# Patient Record
Sex: Female | Born: 1960 | Race: White | Hispanic: No | Smoking: Current some day smoker
Health system: Southern US, Community
[De-identification: ages and names within clinical notes are randomized; demographics above are authoritative.]

## PROBLEM LIST (undated history)

## (undated) DIAGNOSIS — I10 Essential (primary) hypertension: Secondary | ICD-10-CM

## (undated) DIAGNOSIS — M1990 Unspecified osteoarthritis, unspecified site: Secondary | ICD-10-CM

## (undated) DIAGNOSIS — D649 Anemia, unspecified: Secondary | ICD-10-CM

## (undated) DIAGNOSIS — M160 Bilateral primary osteoarthritis of hip: Secondary | ICD-10-CM

## (undated) DIAGNOSIS — J189 Pneumonia, unspecified organism: Secondary | ICD-10-CM

## (undated) DIAGNOSIS — R600 Localized edema: Secondary | ICD-10-CM

## (undated) DIAGNOSIS — A0472 Enterocolitis due to Clostridium difficile, not specified as recurrent: Secondary | ICD-10-CM

## (undated) DIAGNOSIS — J45909 Unspecified asthma, uncomplicated: Secondary | ICD-10-CM

## (undated) DIAGNOSIS — E538 Deficiency of other specified B group vitamins: Secondary | ICD-10-CM

## (undated) HISTORY — DX: Enterocolitis due to Clostridium difficile, not specified as recurrent: A04.72

## (undated) HISTORY — DX: Localized edema: R60.0

## (undated) HISTORY — DX: Pneumonia, unspecified organism: J18.9

## (undated) HISTORY — DX: Essential (primary) hypertension: I10

## (undated) HISTORY — DX: Anemia, unspecified: D64.9

## (undated) HISTORY — DX: Deficiency of other specified B group vitamins: E53.8

---
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
## 2005-02-10 ENCOUNTER — Ambulatory Visit: Payer: Self-pay | Admitting: Internal Medicine

## 2005-02-15 ENCOUNTER — Ambulatory Visit: Payer: Self-pay | Admitting: Internal Medicine

## 2005-03-18 ENCOUNTER — Ambulatory Visit: Payer: Self-pay | Admitting: Internal Medicine

## 2005-04-17 ENCOUNTER — Ambulatory Visit: Payer: Self-pay | Admitting: Internal Medicine

## 2005-05-18 ENCOUNTER — Ambulatory Visit: Payer: Self-pay | Admitting: Internal Medicine

## 2005-06-18 ENCOUNTER — Ambulatory Visit: Payer: Self-pay | Admitting: Internal Medicine

## 2005-07-16 ENCOUNTER — Ambulatory Visit: Payer: Self-pay | Admitting: Internal Medicine

## 2005-08-16 ENCOUNTER — Ambulatory Visit: Payer: Self-pay | Admitting: Internal Medicine

## 2005-09-13 ENCOUNTER — Emergency Department: Payer: Self-pay | Admitting: Emergency Medicine

## 2005-09-15 ENCOUNTER — Ambulatory Visit: Payer: Self-pay | Admitting: Internal Medicine

## 2005-10-16 ENCOUNTER — Ambulatory Visit: Payer: Self-pay | Admitting: Internal Medicine

## 2005-11-15 ENCOUNTER — Ambulatory Visit: Payer: Self-pay | Admitting: Internal Medicine

## 2005-12-16 ENCOUNTER — Ambulatory Visit: Payer: Self-pay | Admitting: Internal Medicine

## 2006-01-16 ENCOUNTER — Ambulatory Visit: Payer: Self-pay | Admitting: Internal Medicine

## 2006-02-15 ENCOUNTER — Ambulatory Visit: Payer: Self-pay | Admitting: Internal Medicine

## 2006-03-18 ENCOUNTER — Ambulatory Visit: Payer: Self-pay | Admitting: Internal Medicine

## 2006-04-17 ENCOUNTER — Ambulatory Visit: Payer: Self-pay | Admitting: Internal Medicine

## 2006-05-18 ENCOUNTER — Ambulatory Visit: Payer: Self-pay | Admitting: Internal Medicine

## 2006-06-18 ENCOUNTER — Ambulatory Visit: Payer: Self-pay | Admitting: Internal Medicine

## 2006-07-17 ENCOUNTER — Ambulatory Visit: Payer: Self-pay | Admitting: Internal Medicine

## 2006-08-17 ENCOUNTER — Ambulatory Visit: Payer: Self-pay | Admitting: Internal Medicine

## 2006-09-16 ENCOUNTER — Ambulatory Visit: Payer: Self-pay | Admitting: Internal Medicine

## 2006-10-17 ENCOUNTER — Ambulatory Visit: Payer: Self-pay | Admitting: Internal Medicine

## 2006-11-16 ENCOUNTER — Ambulatory Visit: Payer: Self-pay | Admitting: Internal Medicine

## 2006-12-17 ENCOUNTER — Ambulatory Visit: Payer: Self-pay | Admitting: Internal Medicine

## 2007-01-17 ENCOUNTER — Ambulatory Visit: Payer: Self-pay | Admitting: Internal Medicine

## 2007-02-16 ENCOUNTER — Ambulatory Visit: Payer: Self-pay | Admitting: Internal Medicine

## 2007-03-19 ENCOUNTER — Ambulatory Visit: Payer: Self-pay | Admitting: Internal Medicine

## 2007-04-18 ENCOUNTER — Ambulatory Visit: Payer: Self-pay | Admitting: Internal Medicine

## 2007-05-19 ENCOUNTER — Ambulatory Visit: Payer: Self-pay | Admitting: Internal Medicine

## 2007-06-19 ENCOUNTER — Ambulatory Visit: Payer: Self-pay | Admitting: Internal Medicine

## 2007-06-21 ENCOUNTER — Ambulatory Visit: Payer: Self-pay | Admitting: Orthopedic Surgery

## 2007-07-17 ENCOUNTER — Ambulatory Visit: Payer: Self-pay | Admitting: Internal Medicine

## 2007-08-01 ENCOUNTER — Ambulatory Visit (HOSPITAL_COMMUNITY): Admission: RE | Admit: 2007-08-01 | Discharge: 2007-08-01 | Payer: Self-pay | Admitting: Neurosurgery

## 2007-08-17 ENCOUNTER — Ambulatory Visit: Payer: Self-pay | Admitting: Internal Medicine

## 2007-09-01 ENCOUNTER — Encounter: Admission: RE | Admit: 2007-09-01 | Discharge: 2007-09-01 | Payer: Self-pay | Admitting: Neurosurgery

## 2007-09-16 ENCOUNTER — Ambulatory Visit: Payer: Self-pay | Admitting: Internal Medicine

## 2007-10-17 ENCOUNTER — Ambulatory Visit: Payer: Self-pay | Admitting: Internal Medicine

## 2007-10-18 ENCOUNTER — Ambulatory Visit: Payer: Self-pay | Admitting: Neurosurgery

## 2007-10-19 ENCOUNTER — Ambulatory Visit: Payer: Self-pay | Admitting: Neurosurgery

## 2007-11-16 ENCOUNTER — Ambulatory Visit: Payer: Self-pay | Admitting: Internal Medicine

## 2007-11-17 ENCOUNTER — Encounter: Admission: RE | Admit: 2007-11-17 | Discharge: 2007-11-17 | Payer: Self-pay | Admitting: Neurosurgery

## 2007-12-17 ENCOUNTER — Ambulatory Visit: Payer: Self-pay | Admitting: Internal Medicine

## 2007-12-29 ENCOUNTER — Encounter: Admission: RE | Admit: 2007-12-29 | Discharge: 2007-12-29 | Payer: Self-pay | Admitting: Neurosurgery

## 2008-01-03 ENCOUNTER — Ambulatory Visit: Payer: Self-pay | Admitting: Neurosurgery

## 2008-01-17 ENCOUNTER — Ambulatory Visit: Payer: Self-pay | Admitting: Internal Medicine

## 2008-02-16 ENCOUNTER — Ambulatory Visit: Payer: Self-pay | Admitting: Internal Medicine

## 2008-02-29 ENCOUNTER — Ambulatory Visit: Payer: Self-pay | Admitting: Orthopedic Surgery

## 2008-03-07 ENCOUNTER — Ambulatory Visit: Payer: Self-pay | Admitting: Orthopedic Surgery

## 2008-03-18 ENCOUNTER — Ambulatory Visit: Payer: Self-pay | Admitting: Internal Medicine

## 2008-03-23 ENCOUNTER — Ambulatory Visit: Payer: Self-pay | Admitting: Neurosurgery

## 2008-04-17 ENCOUNTER — Ambulatory Visit: Payer: Self-pay | Admitting: Internal Medicine

## 2008-04-24 ENCOUNTER — Ambulatory Visit: Payer: Self-pay | Admitting: Internal Medicine

## 2008-05-18 ENCOUNTER — Ambulatory Visit: Payer: Self-pay | Admitting: Internal Medicine

## 2008-06-18 ENCOUNTER — Ambulatory Visit: Payer: Self-pay | Admitting: Internal Medicine

## 2008-06-19 ENCOUNTER — Ambulatory Visit: Payer: Self-pay | Admitting: Neurosurgery

## 2008-07-16 ENCOUNTER — Ambulatory Visit: Payer: Self-pay | Admitting: Internal Medicine

## 2008-08-16 ENCOUNTER — Ambulatory Visit: Payer: Self-pay | Admitting: Internal Medicine

## 2008-09-15 ENCOUNTER — Ambulatory Visit: Payer: Self-pay | Admitting: Internal Medicine

## 2008-10-16 ENCOUNTER — Ambulatory Visit: Payer: Self-pay | Admitting: Internal Medicine

## 2008-11-15 ENCOUNTER — Ambulatory Visit: Payer: Self-pay | Admitting: Internal Medicine

## 2008-12-16 ENCOUNTER — Ambulatory Visit: Payer: Self-pay | Admitting: Internal Medicine

## 2009-01-16 ENCOUNTER — Ambulatory Visit: Payer: Self-pay | Admitting: Internal Medicine

## 2009-02-15 ENCOUNTER — Ambulatory Visit: Payer: Self-pay | Admitting: Internal Medicine

## 2009-03-15 ENCOUNTER — Ambulatory Visit: Payer: Self-pay | Admitting: Family Medicine

## 2009-03-18 ENCOUNTER — Ambulatory Visit: Payer: Self-pay | Admitting: Internal Medicine

## 2009-04-17 ENCOUNTER — Ambulatory Visit: Payer: Self-pay | Admitting: Internal Medicine

## 2009-05-18 ENCOUNTER — Ambulatory Visit: Payer: Self-pay | Admitting: Internal Medicine

## 2009-06-18 ENCOUNTER — Ambulatory Visit: Payer: Self-pay | Admitting: Internal Medicine

## 2009-07-16 ENCOUNTER — Ambulatory Visit: Payer: Self-pay | Admitting: Internal Medicine

## 2009-08-16 ENCOUNTER — Ambulatory Visit: Payer: Self-pay | Admitting: Internal Medicine

## 2009-09-15 ENCOUNTER — Ambulatory Visit: Payer: Self-pay | Admitting: Internal Medicine

## 2009-10-16 ENCOUNTER — Ambulatory Visit: Payer: Self-pay | Admitting: Internal Medicine

## 2009-11-15 ENCOUNTER — Ambulatory Visit: Payer: Self-pay | Admitting: Internal Medicine

## 2009-12-16 ENCOUNTER — Ambulatory Visit: Payer: Self-pay | Admitting: Internal Medicine

## 2010-01-16 ENCOUNTER — Ambulatory Visit: Payer: Self-pay | Admitting: Internal Medicine

## 2010-02-15 ENCOUNTER — Ambulatory Visit: Payer: Self-pay | Admitting: Internal Medicine

## 2010-03-18 ENCOUNTER — Ambulatory Visit: Payer: Self-pay | Admitting: Internal Medicine

## 2010-04-17 ENCOUNTER — Ambulatory Visit: Payer: Self-pay | Admitting: Internal Medicine

## 2010-05-18 ENCOUNTER — Ambulatory Visit: Payer: Self-pay | Admitting: Internal Medicine

## 2010-05-18 HISTORY — PX: SHOULDER SURGERY: SHX246

## 2010-06-18 ENCOUNTER — Ambulatory Visit: Payer: Self-pay | Admitting: Internal Medicine

## 2010-07-17 ENCOUNTER — Ambulatory Visit: Payer: Self-pay | Admitting: Internal Medicine

## 2010-08-17 ENCOUNTER — Ambulatory Visit: Payer: Self-pay | Admitting: Internal Medicine

## 2010-09-16 ENCOUNTER — Ambulatory Visit: Payer: Self-pay | Admitting: Internal Medicine

## 2010-09-30 NOTE — Op Note (Signed)
NAMEMAURINE, MOWBRAY NO.:  [id number]   MEDICAL RECORD NO.:  [id number]          PATIENT TYPE:  OIB   LOCATION:  3599                         FACILITY:  [facility]   PHYSICIAN:  Donalee Citrin, M.D.        DATE OF BIRTH:  11-19-1960   DATE OF PROCEDURE:  08/01/2007  DATE OF DISCHARGE:                               OPERATIVE REPORT   PREOPERATIVE DIAGNOSIS:  Left C6 radiculopathy from cervical  spondylosis, with stenosis at C5-6.   POSTOPERATIVE DIAGNOSIS:  Left C6 radiculopathy from cervical  spondylosis, with stenosis at C5-6.   PROCEDURE:  Anterior cervical diskectomy and fusion at C5-6, using a 6-  mm Cornerstone allograft wedge and a 23.5-mm Venture plate with four 13  mm __________ screws.   SURGEON:  Donalee Citrin, M.D.   ASSISTANT:  Reinaldo Meeker, M.D.   ANESTHESIA:  General endotracheal.   HISTORY OF PRESENT ILLNESS:  The patient is a very pleasant 50 year old  female who has had progressive worsening neck pain and left arm pain,  radiating down to her thumb and forefinger; consistent with a C6 nerve  root pattern.  A MRI scan showed severe cervical spondylosis, with  biforaminal stenosis at the C6 nerve root.  The patient's preoperative  examination had weakness to the left triceps and left biceps.  Clinical  exam, MRI findings and subjective examination was consistent with a C6  radicular pattern.  She failed all forms of conservative therapy, and  was recommended to do a cervical diskectomy and fusion.  The risks,  benefits and alternatives of the operation were explained to the  patient.  She understands and agreed to the procedure.   DESCRIPTION OF PROCEDURE:  The patient was brought to the OR and was  induced under general anesthesia.  The patient was placed supine and the  neck flexed using 5 pounds of halter traction.  The left side of the  neck was prepped and draped in the usual sterile fashion.  Preoperative  X-ray localized the appropriate  level.  A curvilinear incision was made  just off the midline to the anterior border of the sternocleidomastoid.  The superficial layer of the platysma was dissected out and divided  longitudinally.  The avascular plane to the sternocleidomastoid strap  muscles was developed down to the prevertebral fascia.  Prevertebral  fascia was then dissected with Kittner's.  Intraoperative x-ray  confirmed localization of the C5-6 disk space.   At this point, the longus colli was reflected laterally and a self-  retaining retractor was placed.  A large anterior osteophyte complex was  bitten off of the C5-6 disk space, comprising of the inferior endplate  of C5 and superior endplate of C6.  This was identified.  Annulotomy was  extended with the [facility] scalpel, and the disk space was drilled down;  squaring off the endplates as I was drilling down the posterior annulus  and posterior osteophyte complex.   At this point, the operating microscope was draped and brought into the  field.  Under microscopic illumination, the disk space was further  drilled down of the posterior osteophytes and posterior longitudinal  ligament.  Then, using only the Kerrison punch, the undersurface of the  C5 and C6 vertebral bodies were underbitten, exposing the PLL; which was  then removed in a piecemeal fashion, exposing the thecal sac.  Then,  marching out the leftward, the C6 pedicle was identified.  There was  noted to be marked spondylosis with stenosis causing severe foraminal  compression of the C6 nerve root; predominately from hypertrophy but  also from the large osteophyte.  This was all aggressively underbitten,  skeletonizing the C6 nerve root distal to the distal part of the  pedicle.  At the end of the foraminotomy there was no further stenosis,  and the C6 nerve root easily accepted an angled nerve hook.   Then the endplates were underbitten, marching across the right side.  The right C6 pedicle was  identified.  The right C6 neural foramen was  opened up.  At the end of the diskectomy there was no further stenosis  at either the nerve root or the central canal.  Both endplates were  inspected.  Aggressive underbiting of both endplates achieved adequate  decompression centrally.  Then the  endplates were scraped; a size 6-mm  allograft was inserted.  A 23.5-mm Venture plate was then placed.  All  screws had excellent purchase.  Locking mechanism was engaged.   The wound was then copiously irrigated.  Meticulous hemostasis was  maintained.  The wound was closed in layers with interrupted Vicryl, and  the platysma with a running 4-0 subcuticular.  Benzoin and Steri-Strips  were applied.   The patient went to the recovery room in stable condition.  At the end  of the case,  needle, instrument and sponge counts were correct.           ______________________________  Donalee Citrin, M.D.     GC/MEDQ  D:  08/01/2007  T:  08/01/2007  Job:  [id number]

## 2010-10-17 ENCOUNTER — Ambulatory Visit: Payer: Self-pay | Admitting: Internal Medicine

## 2010-11-16 ENCOUNTER — Ambulatory Visit: Payer: Self-pay | Admitting: Internal Medicine

## 2010-12-17 ENCOUNTER — Ambulatory Visit: Payer: Self-pay | Admitting: Internal Medicine

## 2011-01-17 ENCOUNTER — Ambulatory Visit: Payer: Self-pay | Admitting: Internal Medicine

## 2011-02-09 LAB — CBC
HCT: 39.5
Hemoglobin: 13.6
MCHC: 34.4
MCV: 105.1 — ABNORMAL HIGH
Platelets: 273
RDW: 12.9

## 2011-02-16 ENCOUNTER — Ambulatory Visit: Payer: Self-pay | Admitting: Internal Medicine

## 2011-03-19 ENCOUNTER — Ambulatory Visit: Payer: Self-pay | Admitting: Internal Medicine

## 2011-04-18 ENCOUNTER — Ambulatory Visit: Payer: Self-pay | Admitting: Internal Medicine

## 2011-05-19 ENCOUNTER — Ambulatory Visit: Payer: Self-pay | Admitting: Internal Medicine

## 2011-05-19 HISTORY — PX: THROAT SURGERY: SHX803

## 2011-06-03 ENCOUNTER — Ambulatory Visit: Payer: Self-pay | Admitting: Otolaryngology

## 2011-06-19 ENCOUNTER — Ambulatory Visit: Payer: Self-pay | Admitting: Internal Medicine

## 2011-07-17 ENCOUNTER — Ambulatory Visit: Payer: Self-pay | Admitting: Internal Medicine

## 2011-08-17 ENCOUNTER — Ambulatory Visit: Payer: Self-pay | Admitting: Internal Medicine

## 2011-09-16 ENCOUNTER — Ambulatory Visit: Payer: Self-pay | Admitting: Internal Medicine

## 2011-10-17 ENCOUNTER — Ambulatory Visit: Payer: Self-pay | Admitting: Internal Medicine

## 2011-11-16 ENCOUNTER — Ambulatory Visit: Payer: Self-pay | Admitting: Internal Medicine

## 2011-12-17 ENCOUNTER — Ambulatory Visit: Payer: Self-pay | Admitting: Internal Medicine

## 2012-01-17 ENCOUNTER — Ambulatory Visit: Payer: Self-pay | Admitting: Internal Medicine

## 2012-02-16 ENCOUNTER — Ambulatory Visit: Payer: Self-pay | Admitting: Internal Medicine

## 2012-03-18 ENCOUNTER — Ambulatory Visit: Payer: Self-pay | Admitting: Internal Medicine

## 2012-04-17 ENCOUNTER — Ambulatory Visit: Payer: Self-pay | Admitting: Internal Medicine

## 2012-05-18 ENCOUNTER — Ambulatory Visit: Payer: Self-pay | Admitting: Internal Medicine

## 2012-05-18 HISTORY — PX: KNEE SURGERY: SHX244

## 2012-05-18 HISTORY — PX: NECK SURGERY: SHX720

## 2012-06-18 ENCOUNTER — Ambulatory Visit: Payer: Self-pay | Admitting: Internal Medicine

## 2012-07-16 ENCOUNTER — Ambulatory Visit: Payer: Self-pay | Admitting: Internal Medicine

## 2012-08-16 ENCOUNTER — Ambulatory Visit: Payer: Self-pay | Admitting: Internal Medicine

## 2012-09-15 ENCOUNTER — Ambulatory Visit: Payer: Self-pay | Admitting: Internal Medicine

## 2012-10-16 ENCOUNTER — Ambulatory Visit: Payer: Self-pay | Admitting: Internal Medicine

## 2012-11-15 ENCOUNTER — Ambulatory Visit: Payer: Self-pay | Admitting: Internal Medicine

## 2012-12-16 ENCOUNTER — Ambulatory Visit: Payer: Self-pay | Admitting: Internal Medicine

## 2012-12-27 ENCOUNTER — Ambulatory Visit: Payer: Self-pay | Admitting: Podiatry

## 2013-01-16 ENCOUNTER — Ambulatory Visit: Payer: Self-pay | Admitting: Internal Medicine

## 2013-02-15 ENCOUNTER — Ambulatory Visit: Payer: Self-pay | Admitting: Internal Medicine

## 2013-02-20 ENCOUNTER — Encounter: Payer: Self-pay | Admitting: Podiatry

## 2013-02-20 ENCOUNTER — Ambulatory Visit (INDEPENDENT_AMBULATORY_CARE_PROVIDER_SITE_OTHER): Payer: BC Managed Care – PPO | Admitting: Podiatry

## 2013-02-20 VITALS — BP 141/103 | HR 114 | Temp 99.4°F | Resp 16 | Ht 64.0 in | Wt 202.6 lb

## 2013-02-20 DIAGNOSIS — M7672 Peroneal tendinitis, left leg: Secondary | ICD-10-CM | POA: Insufficient documentation

## 2013-02-20 DIAGNOSIS — M722 Plantar fascial fibromatosis: Secondary | ICD-10-CM | POA: Insufficient documentation

## 2013-02-20 DIAGNOSIS — M7750 Other enthesopathy of unspecified foot: Secondary | ICD-10-CM

## 2013-02-20 NOTE — Progress Notes (Signed)
Brenda [name] presents today for a chief complaint of perineal tendinitis and plantar fasciitis and for followup of physical therapy. She states that she's continually gets better on a regular basis. She does not appear to be regressing at all. I think physical therapy is helping she says.  Objective: I reviewed her past medical history medications and allergies. Pulses are palpable to the left foot. She has mild tenderness and edema overlying the peroneal tendons left. Some fluctuation is noted. She has no pain on palpation of the medial calcaneal tubercle.  Assessment: Slowly resolving peroneal tendinitis and plantar fasciitis left.  Plan: Continue physical therapy we also had her scan today for topics. We will followup with her once the orthotics come in. Continue all conservative therapies.

## 2013-02-20 NOTE — Patient Instructions (Signed)
Peroneal Tendinitis with Rehab Tendonitis is inflammation of a tendon. Inflammation of the tendons on the back of the outer ankle (peroneal tendons) is known as peroneal tendonitis. The peroneal tendons are responsible for connecting the muscles that allow you to stand on your "tippy toes" to the bones of the ankle. For this reason, peroneal tendonitis often causes pain when trying to complete such motions. Peroneal tendonitis often involves a tear (strain) of the peroneal tendons. Strains are classified into three categories. Grade 1 strains cause pain, but the tendon is not lengthened. Grade 2 strains include a lengthened ligament, due to the ligament being stretched or partially ruptured. With grade 2 strains there is still function, although function may be decreased. Grade 3 strains involve a complete tear of the tendon or muscle, and function is usually impaired. SYMPTOMS   Pain, tenderness, swelling, warmth, or redness over the back of the outer side of the ankle, the outer part of the mid-foot, or the bottom of the arch.  Pain that gets worse with ankle motion (especially when pushing off or pushing down with the front of the foot), or when standing on the ball of the foot or pushing the foot outward.  Crackling sound (crepitation) when the tendon is moved or touched. CAUSES  Peroneal tendinitis occurs when injury to the peroneal tendons causes the body to respond with inflammation. Common causes of injury include:  An overuse injury, in which the groove behind the outer ankle (where the tendon is located) causes wear on the tendon.  A sudden stress placed on the tendon, such as from an increase in the intensity, frequency, or duration of training.  Direct hit (trauma) to the tendon.  Return to activity too soon after a previous ankle injury. RISK INCREASES WITH:  Sports that require sudden, repetitive pushing off of the foot, such as jumping or quick starts.  Kicking and running  sports, especially running down hills or long distances.  Poor strength and flexibility.  Previous injury to the foot, ankle, or leg. PREVENTION  Warm up and stretch properly before activity.  Allow for adequate recovery between workouts.  Maintain physical fitness:  Strength, flexibility, and endurance.  Cardiovascular fitness.  Complete rehabilitation after previous injury. PROGNOSIS  If treated properly, peroneal tendonitis usually heals within 6 weeks.  RELATED COMPLICATIONS  Longer healing time, if not properly treated or if not given enough time to heal.  Recurring symptoms, if activity is resumed too soon, with overuse, or when using poor technique.  If untreated, tendinitis may result in tendon rupture, requiring surgery. TREATMENT  Treatment first involves the use of ice and medicine, to reduce pain and inflammation. The use of strengthening and stretching exercises may help reduce pain with activity. These exercises may be performed at home or with a therapist. Sometimes, the foot and ankle will be restrained for 10 to 14 days to promote healing. Your caregiver may advise that you place a heel lift in your shoes to reduce the stress placed on the tendon. If non-surgical treatment is unsuccessful, surgery to remove the inflamed tendon lining (sheath) may be advised.  MEDICATION   If pain medicine is needed, nonsteroidal anti-inflammatory medicines (aspirin and ibuprofen), or other minor pain relievers (acetaminophen), are often advised.  Do not take pain medicine for 7 days before surgery.  Prescription pain relievers may be given, if your caregiver thinks they are needed. Use only as directed and only as much as you need. HEAT AND COLD  Cold treatment (icing)   should be applied for 10 to 15 minutes every 2 to 3 hours for inflammation and pain, and immediately after activity that aggravates your symptoms. Use ice packs or an ice massage.  Heat treatment may be used  before performing stretching and strengthening activities prescribed by your caregiver, physical therapist, or athletic trainer. Use a heat pack or a warm water soak. SEEK MEDICAL CARE IF:  Symptoms get worse or do not improve in 2 to 4 weeks, despite treatment.  New, unexplained symptoms develop. (Drugs used in treatment may produce side effects.) EXERCISES RANGE OF MOTION (ROM) AND STRETCHING EXERCISES - Peroneal Tendinitis These exercises may help you when beginning to rehabilitate your injury. Your symptoms may resolve with or without further involvement from your physician, physical therapist or athletic trainer. While completing these exercises, remember:   Restoring tissue flexibility helps normal motion to return to the joints. This allows healthier, less painful movement and activity.  An effective stretch should be held for at least 30 seconds.  A stretch should never be painful. You should only feel a gentle lengthening or release in the stretched tissue. RANGE OF MOTION - Ankle Eversion  Sit with your right / left ankle crossed over your opposite knee.  Grip your foot with your opposite hand, placing your thumb on the top of your foot and your fingers across the bottom of your foot.  Gently push your foot downward with a slight rotation, so your littlest toes rise slightly toward the ceiling.  You should feel a gentle stretch on the inside of your ankle. Hold the stretch for __________ seconds. Repeat __________ times. Complete this exercise __________ times per day.  RANGE OF MOTION - Ankle Inversion  Sit with your right / left ankle crossed over your opposite knee.  Grip your foot with your opposite hand, placing your thumb on the bottom of your foot and your fingers across the top of your foot.  Gently pull your foot so the smallest toe comes toward you and your thumb pushes the inside of the ball of your foot away from you.  You should feel a gentle stretch on the  outside of your ankle. Hold the stretch for __________ seconds. Repeat __________ times. Complete this exercise __________ times per day.  RANGE OF MOTION - Ankle Plantar Flexion  Sit with your right / left leg crossed over your opposite knee.  Use your opposite hand to pull the top of your foot and toes toward you.  You should feel a gentle stretch on the top of your foot and ankle. Hold this position for __________ seconds. Repeat __________ times. Complete __________ times per day.  STRETCH  Gastroc, Standing  Place your hands on a wall.  Extend your right / left leg behind you, keeping the front knee somewhat bent.  Slightly point your toes inward on your back foot.  Keeping your right / left heel on the floor and your knee straight, shift your weight toward the wall, not allowing your back to arch.  You should feel a gentle stretch in the calf. Hold this position for __________ seconds. Repeat __________ times. Complete this stretch __________ times per day. STRETCH  Soleus, Standing  Place your hands on a wall.  Extend your right / left leg behind you, keeping the other knee somewhat bent.  Slightly point your toes inward on your back foot.  Keep your heel on the floor, bend your back knee, and slightly shift your weight over the back leg so  that you feel a gentle stretch deep in your back calf.  Hold this position for __________ seconds. Repeat __________ times. Complete this stretch __________ times per day. STRETCH  Gastrocsoleus, Standing Note: This exercise can place a lot of stress on your foot and ankle. Please complete this exercise only if specifically instructed by your caregiver.   Place the ball of your right / left foot on a step, keeping your other foot firmly on the same step.  Hold on to the wall or a rail for balance.  Slowly lift your other foot, allowing your body weight to press your heel down over the edge of the step.  You should feel a stretch  in your right / left calf.  Hold this position for __________ seconds.  Repeat this exercise with a slight bend in your knee. Repeat __________ times. Complete this stretch __________ times per day.  STRENGTHENING EXERCISES - Peroneal Tendinitis  These exercises may help you when beginning to rehabilitate your injury. They may resolve your symptoms with or without further involvement from your physician, physical therapist or athletic trainer. While completing these exercises, remember:   Muscles can gain both the endurance and the strength needed for everyday activities through controlled exercises.  Complete these exercises as instructed by your physician, physical therapist or athletic trainer. Increase the resistance and repetitions only as guided by your caregiver. STRENGTH - Dorsiflexors  Secure a rubber exercise band or tubing to a fixed object (table, pole) and loop the other end around your right / left foot.  Sit on the floor facing the fixed object. The band should be slightly tense when your foot is relaxed.  Slowly draw your foot back toward you, using your ankle and toes.  Hold this position for __________ seconds. Slowly release the tension in the band and return your foot to the starting position. Repeat __________ times. Complete this exercise __________ times per day.  STRENGTH - Towel Curls  Sit in a chair, on a non-carpeted surface.  Place your foot on a towel, keeping your heel on the floor.  Pull the towel toward your heel only by curling your toes. Keep your heel on the floor.  If instructed by your physician, physical therapist or athletic trainer, add weight to the end of the towel. Repeat __________ times. Complete this exercise __________ times per day. STRENGTH - Ankle Eversion   Secure one end of a rubber exercise band or tubing to a fixed object (table, pole). Loop the other end around your foot, just before your toes.  Place your fists between your  knees. This will focus your strengthening at your ankle.  Drawing the band across your opposite foot, away from the pole, slowly, pull your little toe out and up. Make sure the band is positioned to resist the entire motion.  Hold this position for __________ seconds.  Have your muscles resist the band, as it slowly pulls your foot back to the starting position. Repeat __________ times. Complete this exercise __________ times per day.  Document Released: 05/04/2005 Document Revised: 07/27/2011 Document Reviewed: 08/16/2008 [facility] Patient Information 2014 [place], [place]. Plantar Fasciitis (Heel Spur Syndrome) with Rehab The plantar fascia is a fibrous, ligament-like, soft-tissue structure that spans the bottom of the foot. Plantar fasciitis is a condition that causes pain in the foot due to inflammation of the tissue. SYMPTOMS   Pain and tenderness on the underneath side of the foot.  Pain that worsens with standing or walking. CAUSES  Plantar fasciitis  is caused by irritation and injury to the plantar fascia on the underneath side of the foot. Common mechanisms of injury include:  Direct trauma to bottom of the foot.  Damage to a small nerve that runs under the foot where the main fascia attaches to the heel bone.  Stress placed on the plantar fascia due to bone spurs. RISK INCREASES WITH:   Activities that place stress on the plantar fascia (running, jumping, pivoting, or cutting).  Poor strength and flexibility.  Improperly fitted shoes.  Tight calf muscles.  Flat feet.  Failure to warm-up properly before activity.  Obesity. PREVENTION  Warm up and stretch properly before activity.  Allow for adequate recovery between workouts.  Maintain physical fitness:  Strength, flexibility, and endurance.  Cardiovascular fitness.  Maintain a health body weight.  Avoid stress on the plantar fascia.  Wear properly fitted shoes, including arch supports for individuals  who have flat feet. PROGNOSIS  If treated properly, then the symptoms of plantar fasciitis usually resolve without surgery. However, occasionally surgery is necessary. RELATED COMPLICATIONS   Recurrent symptoms that may result in a chronic condition.  Problems of the lower back that are caused by compensating for the injury, such as limping.  Pain or weakness of the foot during push-off following surgery.  Chronic inflammation, scarring, and partial or complete fascia tear, occurring more often from repeated injections. TREATMENT  Treatment initially involves the use of ice and medication to help reduce pain and inflammation. The use of strengthening and stretching exercises may help reduce pain with activity, especially stretches of the Achilles tendon. These exercises may be performed at home or with a therapist. Your caregiver may recommend that you use heel cups of arch supports to help reduce stress on the plantar fascia. Occasionally, corticosteroid injections are given to reduce inflammation. If symptoms persist for greater than 6 months despite non-surgical (conservative), then surgery may be recommended.  MEDICATION   If pain medication is necessary, then nonsteroidal anti-inflammatory medications, such as aspirin and ibuprofen, or other minor pain relievers, such as acetaminophen, are often recommended.  Do not take pain medication within 7 days before surgery.  Prescription pain relievers may be given if deemed necessary by your caregiver. Use only as directed and only as much as you need.  Corticosteroid injections may be given by your caregiver. These injections should be reserved for the most serious cases, because they may only be given a certain number of times. HEAT AND COLD  Cold treatment (icing) relieves pain and reduces inflammation. Cold treatment should be applied for 10 to 15 minutes every 2 to 3 hours for inflammation and pain and immediately after any activity that  aggravates your symptoms. Use ice packs or massage the area with a piece of ice (ice massage).  Heat treatment may be used prior to performing the stretching and strengthening activities prescribed by your caregiver, physical therapist, or athletic trainer. Use a heat pack or soak the injury in warm water. SEEK IMMEDIATE MEDICAL CARE IF:  Treatment seems to offer no benefit, or the condition worsens.  Any medications produce adverse side effects. EXERCISES RANGE OF MOTION (ROM) AND STRETCHING EXERCISES - Plantar Fasciitis (Heel Spur Syndrome) These exercises may help you when beginning to rehabilitate your injury. Your symptoms may resolve with or without further involvement from your physician, physical therapist or athletic trainer. While completing these exercises, remember:   Restoring tissue flexibility helps normal motion to return to the joints. This allows healthier, less painful movement  and activity.  An effective stretch should be held for at least 30 seconds.  A stretch should never be painful. You should only feel a gentle lengthening or release in the stretched tissue. RANGE OF MOTION - Toe Extension, Flexion  Sit with your right / left leg crossed over your opposite knee.  Grasp your toes and gently pull them back toward the top of your foot. You should feel a stretch on the bottom of your toes and/or foot.  Hold this stretch for __________ seconds.  Now, gently pull your toes toward the bottom of your foot. You should feel a stretch on the top of your toes and or foot.  Hold this stretch for __________ seconds. Repeat __________ times. Complete this stretch __________ times per day.  RANGE OF MOTION - Ankle Dorsiflexion, Active Assisted  Remove shoes and sit on a chair that is preferably not on a carpeted surface.  Place right / left foot under knee. Extend your opposite leg for support.  Keeping your heel down, slide your right / left foot back toward the chair  until you feel a stretch at your ankle or calf. If you do not feel a stretch, slide your bottom forward to the edge of the chair, while still keeping your heel down.  Hold this stretch for __________ seconds. Repeat __________ times. Complete this stretch __________ times per day.  STRETCH  Gastroc, Standing  Place hands on wall.  Extend right / left leg, keeping the front knee somewhat bent.  Slightly point your toes inward on your back foot.  Keeping your right / left heel on the floor and your knee straight, shift your weight toward the wall, not allowing your back to arch.  You should feel a gentle stretch in the right / left calf. Hold this position for __________ seconds. Repeat __________ times. Complete this stretch __________ times per day. STRETCH  Soleus, Standing  Place hands on wall.  Extend right / left leg, keeping the other knee somewhat bent.  Slightly point your toes inward on your back foot.  Keep your right / left heel on the floor, bend your back knee, and slightly shift your weight over the back leg so that you feel a gentle stretch deep in your back calf.  Hold this position for __________ seconds. Repeat __________ times. Complete this stretch __________ times per day. STRETCH  Gastrocsoleus, Standing  Note: This exercise can place a lot of stress on your foot and ankle. Please complete this exercise only if specifically instructed by your caregiver.   Place the ball of your right / left foot on a step, keeping your other foot firmly on the same step.  Hold on to the wall or a rail for balance.  Slowly lift your other foot, allowing your body weight to press your heel down over the edge of the step.  You should feel a stretch in your right / left calf.  Hold this position for __________ seconds.  Repeat this exercise with a slight bend in your right / left knee. Repeat __________ times. Complete this stretch __________ times per day.  STRENGTHENING  EXERCISES - Plantar Fasciitis (Heel Spur Syndrome)  These exercises may help you when beginning to rehabilitate your injury. They may resolve your symptoms with or without further involvement from your physician, physical therapist or athletic trainer. While completing these exercises, remember:   Muscles can gain both the endurance and the strength needed for everyday activities through controlled exercises.  Complete  these exercises as instructed by your physician, physical therapist or athletic trainer. Progress the resistance and repetitions only as guided. STRENGTH - Towel Curls  Sit in a chair positioned on a non-carpeted surface.  Place your foot on a towel, keeping your heel on the floor.  Pull the towel toward your heel by only curling your toes. Keep your heel on the floor.  If instructed by your physician, physical therapist or athletic trainer, add ____________________ at the end of the towel. Repeat __________ times. Complete this exercise __________ times per day. STRENGTH - Ankle Inversion  Secure one end of a rubber exercise band/tubing to a fixed object (table, pole). Loop the other end around your foot just before your toes.  Place your fists between your knees. This will focus your strengthening at your ankle.  Slowly, pull your big toe up and in, making sure the band/tubing is positioned to resist the entire motion.  Hold this position for __________ seconds.  Have your muscles resist the band/tubing as it slowly pulls your foot back to the starting position. Repeat __________ times. Complete this exercises __________ times per day.  Document Released: 05/04/2005 Document Revised: 07/27/2011 Document Reviewed: 08/16/2008 [facility] Patient Information 2014 [place], [place]. Plantar Fasciitis Plantar fasciitis is a common condition that causes foot pain. It is soreness (inflammation) of the band of tough fibrous tissue on the bottom of the foot that runs from the heel  bone (calcaneus) to the ball of the foot. The cause of this soreness may be from excessive standing, poor fitting shoes, running on hard surfaces, being overweight, having an abnormal walk, or overuse (this is common in runners) of the painful foot or feet. It is also common in aerobic exercise dancers and ballet dancers. SYMPTOMS  Most people with plantar fasciitis complain of:  Severe pain in the morning on the bottom of their foot especially when taking the first steps out of bed. This pain recedes after a few minutes of walking.  Severe pain is experienced also during walking following a long period of inactivity.  Pain is worse when walking barefoot or up stairs DIAGNOSIS   Your caregiver will diagnose this condition by examining and feeling your foot.  Special tests such as X-rays of your foot, are usually not needed. PREVENTION   Consult a sports medicine professional before beginning a new exercise program.  Walking programs offer a good workout. With walking there is a lower chance of overuse injuries common to runners. There is less impact and less jarring of the joints.  Begin all new exercise programs slowly. If problems or pain develop, decrease the amount of time or distance until you are at a comfortable level.  Wear good shoes and replace them regularly.  Stretch your foot and the heel cords at the back of the ankle (Achilles tendon) both before and after exercise.  Run or exercise on even surfaces that are not hard. For example, asphalt is better than pavement.  Do not run barefoot on hard surfaces.  If using a treadmill, vary the incline.  Do not continue to workout if you have foot or joint problems. Seek professional help if they do not improve. HOME CARE INSTRUCTIONS   Avoid activities that cause you pain until you recover.  Use ice or cold packs on the problem or painful areas after working out.  Only take over-the-counter or prescription medicines for  pain, discomfort, or fever as directed by your caregiver.  Soft shoe inserts or athletic shoes with  air or gel sole cushions may be helpful.  If problems continue or become more severe, consult a sports medicine caregiver or your own health care provider. Cortisone is a potent anti-inflammatory medication that may be injected into the painful area. You can discuss this treatment with your caregiver. MAKE SURE YOU:   Understand these instructions.  Will watch your condition.  Will get help right away if you are not doing well or get worse. Document Released: 01/27/2001 Document Revised: 07/27/2011 Document Reviewed: 03/28/2008 [facility] Patient Information 2014 [place], [place].

## 2013-02-21 ENCOUNTER — Other Ambulatory Visit: Payer: Self-pay | Admitting: Podiatry

## 2013-02-21 DIAGNOSIS — M722 Plantar fascial fibromatosis: Secondary | ICD-10-CM

## 2013-02-26 ENCOUNTER — Emergency Department: Payer: Self-pay | Admitting: Emergency Medicine

## 2013-03-18 ENCOUNTER — Ambulatory Visit: Payer: Self-pay | Admitting: Internal Medicine

## 2013-03-20 ENCOUNTER — Ambulatory Visit: Payer: Self-pay | Admitting: Podiatry

## 2013-03-28 ENCOUNTER — Ambulatory Visit: Payer: Self-pay | Admitting: Unknown Physician Specialty

## 2013-03-29 ENCOUNTER — Ambulatory Visit: Payer: Self-pay | Admitting: Internal Medicine

## 2013-04-05 ENCOUNTER — Ambulatory Visit: Payer: Self-pay | Admitting: Podiatry

## 2013-04-10 ENCOUNTER — Encounter: Payer: Self-pay | Admitting: Podiatry

## 2013-04-10 ENCOUNTER — Ambulatory Visit (INDEPENDENT_AMBULATORY_CARE_PROVIDER_SITE_OTHER): Payer: BC Managed Care – PPO | Admitting: Podiatry

## 2013-04-10 VITALS — BP 144/99 | HR 95 | Resp 16 | Ht 64.0 in | Wt 199.0 lb

## 2013-04-10 DIAGNOSIS — M7750 Other enthesopathy of unspecified foot: Secondary | ICD-10-CM

## 2013-04-10 DIAGNOSIS — S92919A Unspecified fracture of unspecified toe(s), initial encounter for closed fracture: Secondary | ICD-10-CM

## 2013-04-10 DIAGNOSIS — M7672 Peroneal tendinitis, left leg: Secondary | ICD-10-CM

## 2013-04-10 DIAGNOSIS — S92912A Unspecified fracture of left toe(s), initial encounter for closed fracture: Secondary | ICD-10-CM

## 2013-04-10 NOTE — Progress Notes (Signed)
Brenda [name] presents today for followup of her peroneal tendinitis and to have her orthotics dispensed. She states that November 11 she fell hurt her foot and her left knee. She damaged the meniscus of the collateral ligaments left knee and fractured the base of the proximal phalanx fifth digit left foot. She states radiographs are taken and there was no dislocation of the fifth toe and she did not wish for progress to be taken today. She is not complaining of any foot pain other than the toe at this point.  Objective: Vital signs are stable she is alert and oriented x3 pulses remain palpable left foot. There is no erythema edema cellulitis drainage or odor no ecchymosis. Mild tenderness on palpation of the fifth proximal phalanx left no pain on palpation of the peroneal tendons left.  Assessment: Peroneal tendinitis  Plan: At this point dispensed orthotics will followup with her in 4 weeks if necessary

## 2013-04-10 NOTE — Patient Instructions (Signed)

## 2013-04-17 ENCOUNTER — Ambulatory Visit: Payer: Self-pay | Admitting: Internal Medicine

## 2013-05-08 ENCOUNTER — Ambulatory Visit: Payer: Self-pay | Admitting: Podiatry

## 2013-05-18 ENCOUNTER — Ambulatory Visit: Payer: Self-pay | Admitting: Internal Medicine

## 2013-06-18 ENCOUNTER — Ambulatory Visit: Payer: Self-pay | Admitting: Internal Medicine

## 2013-06-30 ENCOUNTER — Ambulatory Visit: Payer: Self-pay | Admitting: Unknown Physician Specialty

## 2013-07-16 ENCOUNTER — Ambulatory Visit: Payer: Self-pay | Admitting: Internal Medicine

## 2013-08-16 ENCOUNTER — Ambulatory Visit: Payer: Self-pay | Admitting: Internal Medicine

## 2013-09-15 ENCOUNTER — Ambulatory Visit: Payer: Self-pay | Admitting: Internal Medicine

## 2013-10-16 ENCOUNTER — Ambulatory Visit: Payer: Self-pay | Admitting: Internal Medicine

## 2013-10-24 DIAGNOSIS — Z9889 Other specified postprocedural states: Secondary | ICD-10-CM | POA: Insufficient documentation

## 2013-11-15 ENCOUNTER — Ambulatory Visit: Payer: Self-pay | Admitting: Internal Medicine

## 2013-12-16 ENCOUNTER — Ambulatory Visit: Payer: Self-pay | Admitting: Internal Medicine

## 2013-12-22 ENCOUNTER — Inpatient Hospital Stay: Payer: Self-pay | Admitting: Internal Medicine

## 2013-12-22 LAB — CBC
HCT: 42.1 % (ref 35.0–47.0)
HGB: 13.8 g/dL (ref 12.0–16.0)
MCH: 35.7 pg — AB (ref 26.0–34.0)
MCHC: 32.8 g/dL (ref 32.0–36.0)
MCV: 109 fL — AB (ref 80–100)
Platelet: 149 10*3/uL — ABNORMAL LOW (ref 150–440)
RBC: 3.87 10*6/uL (ref 3.80–5.20)
RDW: 13 % (ref 11.5–14.5)
WBC: 12 10*3/uL — ABNORMAL HIGH (ref 3.6–11.0)

## 2013-12-22 LAB — URINALYSIS, COMPLETE
Bacteria: NONE SEEN
Glucose,UR: NEGATIVE mg/dL (ref 0–75)
Hyaline Cast: 124
LEUKOCYTE ESTERASE: NEGATIVE
NITRITE: NEGATIVE
Ph: 5 (ref 4.5–8.0)
Protein: 100
RBC,UR: 24 /HPF (ref 0–5)
SPECIFIC GRAVITY: 1.024 (ref 1.003–1.030)
SQUAMOUS EPITHELIAL: NONE SEEN
WBC UR: 14 /HPF (ref 0–5)

## 2013-12-22 LAB — BASIC METABOLIC PANEL
Anion Gap: 24 — ABNORMAL HIGH (ref 7–16)
BUN: 7 mg/dL (ref 7–18)
CALCIUM: 9.7 mg/dL (ref 8.5–10.1)
CO2: 17 mmol/L — AB (ref 21–32)
Chloride: 87 mmol/L — ABNORMAL LOW (ref 98–107)
Creatinine: 1.45 mg/dL — ABNORMAL HIGH (ref 0.60–1.30)
EGFR (African American): 48 — ABNORMAL LOW
EGFR (Non-African Amer.): 41 — ABNORMAL LOW
Glucose: 110 mg/dL — ABNORMAL HIGH (ref 65–99)
OSMOLALITY: 256 (ref 275–301)
POTASSIUM: 2.8 mmol/L — AB (ref 3.5–5.1)
Sodium: 128 mmol/L — ABNORMAL LOW (ref 136–145)

## 2013-12-22 LAB — TSH: Thyroid Stimulating Horm: 1.29 u[IU]/mL

## 2013-12-22 LAB — PHOSPHORUS: Phosphorus: 1.1 mg/dL — ABNORMAL LOW (ref 2.5–4.9)

## 2013-12-22 LAB — PROTIME-INR
INR: 1.1
Prothrombin Time: 13.7 secs (ref 11.5–14.7)

## 2013-12-22 LAB — TROPONIN I: Troponin-I: <0.02 ng/mL

## 2013-12-22 LAB — MAGNESIUM: Magnesium: 1.3 mg/dL — ABNORMAL LOW

## 2013-12-22 LAB — PRO B NATRIURETIC PEPTIDE: B-TYPE NATIURETIC PEPTID: 888 pg/mL — AB (ref 0–125)

## 2013-12-23 DIAGNOSIS — I369 Nonrheumatic tricuspid valve disorder, unspecified: Secondary | ICD-10-CM

## 2013-12-23 LAB — CBC WITH DIFFERENTIAL/PLATELET
BASOS ABS: 0 10*3/uL (ref 0.0–0.1)
Basophil %: 0.3 %
EOS ABS: 0 10*3/uL (ref 0.0–0.7)
EOS PCT: 0 %
HCT: 37.2 % (ref 35.0–47.0)
HGB: 13.1 g/dL (ref 12.0–16.0)
LYMPHS ABS: 0.2 10*3/uL — AB (ref 1.0–3.6)
Lymphocyte %: 2.3 %
MCH: 37.1 pg — ABNORMAL HIGH (ref 26.0–34.0)
MCHC: 35.1 g/dL (ref 32.0–36.0)
MCV: 106 fL — ABNORMAL HIGH (ref 80–100)
MONOS PCT: 2.7 %
Monocyte #: 0.3 x10 3/mm (ref 0.2–0.9)
Neutrophil #: 9.4 10*3/uL — ABNORMAL HIGH (ref 1.4–6.5)
Neutrophil %: 94.7 %
Platelet: 123 10*3/uL — ABNORMAL LOW (ref 150–440)
RBC: 3.52 10*6/uL — AB (ref 3.80–5.20)
RDW: 12.7 % (ref 11.5–14.5)
WBC: 9.9 10*3/uL (ref 3.6–11.0)

## 2013-12-23 LAB — BASIC METABOLIC PANEL
Anion Gap: 13 (ref 7–16)
BUN: 5 mg/dL — ABNORMAL LOW (ref 7–18)
CALCIUM: 8.1 mg/dL — AB (ref 8.5–10.1)
Chloride: 97 mmol/L — ABNORMAL LOW (ref 98–107)
Co2: 21 mmol/L (ref 21–32)
Creatinine: 0.85 mg/dL (ref 0.60–1.30)
EGFR (Non-African Amer.): >60
Glucose: 266 mg/dL — ABNORMAL HIGH (ref 65–99)
Osmolality: 269 (ref 275–301)
Potassium: 2.9 mmol/L — ABNORMAL LOW (ref 3.5–5.1)
SODIUM: 131 mmol/L — AB (ref 136–145)

## 2013-12-23 LAB — MAGNESIUM: Magnesium: 1.5 mg/dL — ABNORMAL LOW

## 2013-12-23 LAB — CLOSTRIDIUM DIFFICILE(ARMC)

## 2013-12-24 DIAGNOSIS — R0602 Shortness of breath: Secondary | ICD-10-CM

## 2013-12-24 LAB — BASIC METABOLIC PANEL
Anion Gap: 18 — ABNORMAL HIGH (ref 7–16)
BUN: 8 mg/dL (ref 7–18)
CHLORIDE: 99 mmol/L (ref 98–107)
CREATININE: 0.73 mg/dL (ref 0.60–1.30)
Calcium, Total: 8.1 mg/dL — ABNORMAL LOW (ref 8.5–10.1)
Co2: 17 mmol/L — ABNORMAL LOW (ref 21–32)
EGFR (African American): >60
Glucose: 176 mg/dL — ABNORMAL HIGH (ref 65–99)
Osmolality: 271 (ref 275–301)
Potassium: 3.4 mmol/L — ABNORMAL LOW (ref 3.5–5.1)
SODIUM: 134 mmol/L — AB (ref 136–145)

## 2013-12-24 LAB — MAGNESIUM: Magnesium: 1.9 mg/dL

## 2013-12-24 LAB — PHOSPHORUS: Phosphorus: 0.3 mg/dL — CL (ref 2.5–4.9)

## 2013-12-24 LAB — URINE CULTURE

## 2013-12-24 LAB — VANCOMYCIN, TROUGH: VANCOMYCIN, TROUGH: 13 ug/mL (ref 10–20)

## 2013-12-25 DIAGNOSIS — R0602 Shortness of breath: Secondary | ICD-10-CM

## 2013-12-25 LAB — PHOSPHORUS
Phosphorus: 0.4 mg/dL — CL (ref 2.5–4.9)
Phosphorus: 0.5 mg/dL — CL (ref 2.5–4.9)

## 2013-12-25 LAB — POTASSIUM
POTASSIUM: 2.5 mmol/L — AB (ref 3.5–5.1)
Potassium: 2.7 mmol/L — ABNORMAL LOW (ref 3.5–5.1)

## 2013-12-25 LAB — MAGNESIUM
Magnesium: 1.4 mg/dL — ABNORMAL LOW
Magnesium: 2.7 mg/dL — ABNORMAL HIGH

## 2013-12-26 ENCOUNTER — Encounter: Payer: Self-pay | Admitting: Internal Medicine

## 2013-12-26 LAB — BASIC METABOLIC PANEL
Anion Gap: 15 (ref 7–16)
BUN: 10 mg/dL (ref 7–18)
CREATININE: 0.85 mg/dL (ref 0.60–1.30)
Calcium, Total: 7.2 mg/dL — ABNORMAL LOW (ref 8.5–10.1)
Chloride: 91 mmol/L — ABNORMAL LOW (ref 98–107)
Co2: 27 mmol/L (ref 21–32)
EGFR (Non-African Amer.): >60
Glucose: 183 mg/dL — ABNORMAL HIGH (ref 65–99)
Osmolality: 270 (ref 275–301)
Potassium: 2.6 mmol/L — ABNORMAL LOW (ref 3.5–5.1)
Sodium: 133 mmol/L — ABNORMAL LOW (ref 136–145)

## 2013-12-26 LAB — CBC WITH DIFFERENTIAL/PLATELET
BANDS NEUTROPHIL: 4 %
HCT: 35.5 % (ref 35.0–47.0)
HGB: 12.1 g/dL (ref 12.0–16.0)
Lymphocytes: 9 %
MCH: 35.9 pg — ABNORMAL HIGH (ref 26.0–34.0)
MCHC: 34.2 g/dL (ref 32.0–36.0)
MCV: 105 fL — ABNORMAL HIGH (ref 80–100)
METAMYELOCYTE: 1 %
Monocytes: 8 %
Myelocyte: 1 %
Platelet: 257 10*3/uL (ref 150–440)
RBC: 3.38 10*6/uL — ABNORMAL LOW (ref 3.80–5.20)
RDW: 12.8 % (ref 11.5–14.5)
SEGMENTED NEUTROPHILS: 77 %
WBC: 13.1 10*3/uL — AB (ref 3.6–11.0)

## 2013-12-26 LAB — PHOSPHORUS: Phosphorus: 1.3 mg/dL — ABNORMAL LOW (ref 2.5–4.9)

## 2013-12-26 LAB — POTASSIUM: POTASSIUM: 3.4 mmol/L — AB (ref 3.5–5.1)

## 2013-12-26 LAB — MAGNESIUM: Magnesium: 2.4 mg/dL

## 2013-12-27 LAB — PHOSPHORUS: Phosphorus: 1.6 mg/dL — ABNORMAL LOW (ref 2.5–4.9)

## 2013-12-27 LAB — POTASSIUM
POTASSIUM: 2.9 mmol/L — AB (ref 3.5–5.1)
Potassium: 3.9 mmol/L (ref 3.5–5.1)

## 2013-12-27 LAB — MAGNESIUM: Magnesium: 1.9 mg/dL

## 2013-12-27 LAB — CULTURE, BLOOD (SINGLE)

## 2013-12-28 LAB — MAGNESIUM
MAGNESIUM: 1.5 mg/dL — AB
MAGNESIUM: 1.8 mg/dL

## 2013-12-28 LAB — PHOSPHORUS: Phosphorus: 3.5 mg/dL

## 2013-12-28 LAB — POTASSIUM
Potassium: 3.2 mmol/L — ABNORMAL LOW (ref 3.5–5.1)
Potassium: 4.6 mmol/L

## 2013-12-29 ENCOUNTER — Telehealth: Payer: Self-pay

## 2013-12-29 NOTE — Telephone Encounter (Signed)
Attempted to contact pt regarding discharge from [facility] on 12/28/13.  Left message for pt to call back.

## 2014-01-08 ENCOUNTER — Encounter: Payer: Self-pay | Admitting: *Deleted

## 2014-01-09 ENCOUNTER — Encounter: Payer: Self-pay | Admitting: Nurse Practitioner

## 2014-01-09 ENCOUNTER — Ambulatory Visit (INDEPENDENT_AMBULATORY_CARE_PROVIDER_SITE_OTHER): Payer: BC Managed Care – PPO | Admitting: Nurse Practitioner

## 2014-01-09 ENCOUNTER — Encounter (INDEPENDENT_AMBULATORY_CARE_PROVIDER_SITE_OTHER): Payer: Self-pay

## 2014-01-09 VITALS — BP 112/92 | HR 87 | Ht 61.5 in | Wt 193.8 lb

## 2014-01-09 DIAGNOSIS — R0602 Shortness of breath: Secondary | ICD-10-CM | POA: Insufficient documentation

## 2014-01-09 DIAGNOSIS — A0472 Enterocolitis due to Clostridium difficile, not specified as recurrent: Secondary | ICD-10-CM

## 2014-01-09 DIAGNOSIS — J449 Chronic obstructive pulmonary disease, unspecified: Secondary | ICD-10-CM

## 2014-01-09 DIAGNOSIS — J189 Pneumonia, unspecified organism: Secondary | ICD-10-CM | POA: Insufficient documentation

## 2014-01-09 DIAGNOSIS — I5032 Chronic diastolic (congestive) heart failure: Secondary | ICD-10-CM | POA: Insufficient documentation

## 2014-01-09 NOTE — Patient Instructions (Addendum)
We will draw labs today:  BMET  You are scheduled to see Dr. Lake Bells on Tuesday, 01/30/14 @ 3:45 Please call [phone] if you are unable to keep this appt  Call or return to clinic prn if these symptoms worsen or fail to improve as anticipated.  [place]  Your caregiver has ordered a Stress Test with nuclear imaging. The purpose of this test is to evaluate the blood supply to your heart muscle. This procedure is referred to as a "Non-Invasive Stress Test." This is because other than having an IV started in your vein, nothing is inserted or "invades" your body. Cardiac stress tests are done to find areas of poor blood flow to the heart by determining the extent of coronary artery disease (CAD). Some patients exercise on a treadmill, which naturally increases the blood flow to your heart, while others who are  unable to walk on a treadmill due to physical limitations have a pharmacologic/chemical stress agent called Lexiscan . This medicine will mimic walking on a treadmill by temporarily increasing your coronary blood flow.   Please note: these test may take anywhere between 2-4 hours to complete  PLEASE REPORT TO [facility] AT THE FIRST DESK WILL DIRECT YOU WHERE TO GO  Date of Procedure:_____Friday, August 28_______  Arrival Time for Procedure:_____7:15am__________  Instructions regarding medication:   __X__:  Hold betablocker(s) night before procedure and morning of procedure:  METOPROLOL   PLEASE NOTIFY THE OFFICE AT LEAST 24 HOURS IN ADVANCE IF YOU ARE UNABLE TO KEEP YOUR APPOINTMENT.  [phone] AND  PLEASE NOTIFY NUCLEAR MEDICINE AT [facility] AT LEAST 24 HOURS IN ADVANCE IF YOU ARE UNABLE TO KEEP YOUR APPOINTMENT. [phone]  How to prepare for your Myoview test:  1. Do not eat or drink after midnight 2. No caffeine for 24 hours prior to test 3. No smoking 24 hours prior to test. 4. Your medication may be taken with water.  If your doctor  stopped a medication because of this test, do not take that medication. 5. Ladies, please do not wear dresses.  Skirts or pants are appropriate. Please wear a short sleeve shirt. 6. No perfume, cologne or lotion. 7. Wear comfortable walking shoes. No heels!

## 2014-01-09 NOTE — Progress Notes (Signed)
Patient Name: LEE-ANNE FLICKER Date of Encounter: 01/09/2014  Primary Care Provider:  No primary provider on file. Primary Cardiologist:  Seen by Dr. Harrington Challenger @ [facility]  Patient Profile  53 y/o female s/p recent admission r/t pna and C diff, who presents today 2/2 diast chf.  Problem List   Past Medical History  Diagnosis Date  . Anemia   . COPD (chronic obstructive pulmonary disease)     a. 12/2013 CTA Chest: No PE. Peripheral ground-glass opacities and interstitial thickening, most pronounced in the mid & lower lung zones.  . Clostridium difficile colitis     a. 12/2013.  Marland Kitchen Atypical pneumonia     a. 12/2013  . Chronic diastolic CHF (congestive heart failure)     a. 12/2013 Echo: EF 50-55%, ? inferoseptal and basal inferior HK, diastolic dysfxn;  b. 07/3823 Limited echo with contrast: EF 55-60%, no rwma.  . Lower extremity edema    Past Surgical History  Procedure Laterality Date  . Shoulder surgery Left   . Throat surgery    . Tonsillectomy    . Neck surgery      Allergies  Allergies  Allergen Reactions  . Codeine Nausea And Vomiting  . Penicillins     DOES NOT KNOW WHAT TYPE OF REACTION  . Shellfish Allergy Diarrhea and Nausea And Vomiting  . Spiriva [Tiotropium Bromide Monohydrate] Other (See Comments)    CLOSES THROAT    HPI  53 y/o female with the above problem list.  She was recently admitted to [facility] with progressive dyspnea and diarrhea, and was diagnosed with pna and placed on abx, steroids, and inhalers.  She was also found to have C diff.  In the setting of her dyspnea, an echo was performed and initially was read out as EF of 50-55% with question of inferoseptal and basal inferior HK, and diastolic dysfxn.  A repeat limited study was done with contrast and showed nl LV fxn w/o wma's.  It is unclear from the notes, but it appears that her lasix dose was increased to 40mg  daily (prev on 20 @ home) and potassium was also added to her regimen.  It was suggested by our team,  that she undergo stress testing once she recovers from pna and C diff.  Since her discharge, she has been doing well.  She has had no further diarrhea and has completed her abx course.  She denies chest pain, palpitations, dyspnea, pnd, orthopnea, n, v, dizziness, syncope, edema, weight gain, or early satiety. In fact, her wt is down 13 lbs since d/c.  Home Medications  Prior to Admission medications   Medication Sig Start Date End Date Taking? Authorizing Provider  aspirin 81 MG tablet Take 81 mg by mouth daily.   Yes Historical Provider, MD  cyanocobalamin (,VITAMIN B-12,) 1000 MCG/ML injection Inject 1,000 mcg into the muscle every 30 (thirty) days.   Yes Historical Provider, MD  Fluticasone-Salmeterol (ADVAIR DISKUS) 250-50 MCG/DOSE AEPB Inhale 1 puff into the lungs 2 (two) times daily.   Yes Historical Provider, MD  furosemide (LASIX) 20 MG tablet Take 40 mg by mouth daily.    Yes Historical Provider, MD  metoprolol (LOPRESSOR) 50 MG tablet Take 50 mg by mouth 2 (two) times daily.   Yes Historical Provider, MD  potassium chloride (KLOR-CON) 20 MEQ packet Take 20 mEq by mouth daily.   Yes Historical Provider, MD   Review of Systems  As above, doing well.  Down 13 lbs since admission.  No  further diarrhea.  She denies chest pain, palpitations, dyspnea, pnd, orthopnea, n, v, dizziness, syncope, edema, weight gain, or early satiety.  All other systems reviewed and are otherwise negative except as noted above.  Physical Exam  Blood pressure 112/92, pulse 87, height 5' 1.5" (1.562 m), weight 193 lb 12 oz (87.884 kg), SpO2 99.00%.  General: Pleasant, NAD Psych: Normal affect. Neuro: Alert and oriented X 3. Moves all extremities spontaneously. HEENT: Normal  Neck: Supple without bruits or JVD. Lungs:  Resp regular and unlabored, CTA. Heart: RRR no s3, s4, or murmurs. Abdomen: Soft, non-tender, non-distended, BS + x 4.  Extremities: No clubbing, cyanosis or edema. DP/PT/Radials 2+ and equal  bilaterally.  Accessory Clinical Findings  ECG - RSR, 87, no acute ST/T changes.  Assessment & Plan  1.  Chronic Diastolic CHF:  She is down 13 lbs since discharge and has had no dyspnea, pnd, orthopnea, or edema.  Her appetite has been good.  She was placed on metoprolol while hospitalized for sinus tachycardia and today HR/BP are stable.  She remains on 40mg  of lasix @ home with potassium supplementation.  She appears a little dry.  Will check a bmet today. Suspect we will be able to cut back on her lasix to her prior home dose.  As was planned while hospitalized, since she has fully recovered from pna/C diff, we will arrange for an outpt lexiscan MV to r/o ischemia.  2.  PNA:  Resolved.  She was discharged home on O2 @ 2lpm.  She has had no recurrent dyspnea and has not been using O2.  We walked her today in clinic. Room air O2 sat was 98% @ rest and 96% with ambulation.  At her request, I have faxed an order for discontinuation of home O2 to [phone].  3.  C diff colitis:  Resolved.  4.  Abnl Chest CT/COPD:  No active wheezing today. She does have a smoking hx but quit in 2012.  CTA done while hospitalized showed peripheral ground-glass opacities and interstitial thickening, most pronounced in the mid & lower lung zones with recommendation for f/u CT in 6-12 mos.  Her PCP would like her to see pulmonology and Ms. Rommel would like for [place] to refer her.  We will arrange for f/u with Dr. Lake Bells.    5. Dispo:  BMET today.  Myoview next week. F/U prn if studies wnl.   Murray Hodgkins, NP 01/09/2014, 3:11 PM

## 2014-01-10 LAB — BASIC METABOLIC PANEL
BUN/Creatinine Ratio: 13 (ref 9–23)
BUN: 7 mg/dL (ref 6–24)
CO2: 21 mmol/L (ref 18–29)
CREATININE: 0.52 mg/dL — AB (ref 0.57–1.00)
Calcium: 9 mg/dL (ref 8.7–10.2)
Chloride: 98 mmol/L (ref 97–108)
GFR, EST AFRICAN AMERICAN: 127 mL/min/{1.73_m2} (ref >59)
GFR, EST NON AFRICAN AMERICAN: 110 mL/min/{1.73_m2} (ref >59)
GLUCOSE: 112 mg/dL — AB (ref 65–99)
Potassium: 5.3 mmol/L — ABNORMAL HIGH (ref 3.5–5.2)
Sodium: 137 mmol/L (ref 134–144)

## 2014-01-11 ENCOUNTER — Encounter: Payer: Self-pay | Admitting: *Deleted

## 2014-01-11 ENCOUNTER — Telehealth: Payer: Self-pay | Admitting: *Deleted

## 2014-01-11 MED ORDER — FUROSEMIDE 20 MG PO TABS: 20.0000 mg | ORAL_TABLET | Freq: Every day | ORAL | Status: DC

## 2014-01-11 NOTE — Telephone Encounter (Signed)
See lab   Per Gerald Stabs   Stop potassium and reduce lasix to 20mg  daily.

## 2014-01-12 ENCOUNTER — Ambulatory Visit: Payer: Self-pay | Admitting: Cardiovascular Disease

## 2014-01-12 DIAGNOSIS — I5032 Chronic diastolic (congestive) heart failure: Secondary | ICD-10-CM

## 2014-01-15 ENCOUNTER — Other Ambulatory Visit: Payer: Self-pay

## 2014-01-15 DIAGNOSIS — J449 Chronic obstructive pulmonary disease, unspecified: Secondary | ICD-10-CM

## 2014-01-15 DIAGNOSIS — I5032 Chronic diastolic (congestive) heart failure: Secondary | ICD-10-CM

## 2014-01-16 ENCOUNTER — Ambulatory Visit: Payer: Self-pay | Admitting: Internal Medicine

## 2014-01-18 ENCOUNTER — Telehealth: Payer: Self-pay

## 2014-01-18 ENCOUNTER — Telehealth: Payer: Self-pay | Admitting: Nurse Practitioner

## 2014-01-18 DIAGNOSIS — E875 Hyperkalemia: Secondary | ICD-10-CM

## 2014-01-18 NOTE — Telephone Encounter (Signed)
Pt has question regarding her potassium. Not sure if she should start back on it or not. Please advise.

## 2014-01-18 NOTE — Telephone Encounter (Signed)
Spoke w/ pt.  Advised her of Chris's recommendation.  She states that she has been on lasix and K+ for about 20 years and would like to know when to resume this.  Spoke w/ Ignacia Bayley, NP, who advises that pt have repeat BMET before advising her to resume K+. Advised pt come over and p/u orders to take to hospital tomorrow, otherwise she will have to wait until Tues to get results.  She verbalizes understanding.

## 2014-01-18 NOTE — Telephone Encounter (Signed)
Though we dealt with this on 8/27 (copied phone note from that day below):  See lab  Per Gerald Stabs   Stop potassium and reduce lasix to 20mg  daily.

## 2014-01-19 ENCOUNTER — Other Ambulatory Visit: Payer: Self-pay | Admitting: Nurse Practitioner

## 2014-01-19 ENCOUNTER — Telehealth: Payer: Self-pay

## 2014-01-19 ENCOUNTER — Other Ambulatory Visit: Payer: Self-pay

## 2014-01-19 DIAGNOSIS — E875 Hyperkalemia: Secondary | ICD-10-CM

## 2014-01-19 LAB — BASIC METABOLIC PANEL
Anion Gap: 10 (ref 7–16)
BUN: 3 mg/dL — AB (ref 7–18)
CHLORIDE: 106 mmol/L (ref 98–107)
Calcium, Total: 8.1 mg/dL — ABNORMAL LOW (ref 8.5–10.1)
Co2: 25 mmol/L (ref 21–32)
Creatinine: 0.62 mg/dL (ref 0.60–1.30)
EGFR (African American): >60
Glucose: 101 mg/dL — ABNORMAL HIGH (ref 65–99)
OSMOLALITY: 278 (ref 275–301)
Potassium: 3.6 mmol/L (ref 3.5–5.1)
Sodium: 141 mmol/L (ref 136–145)

## 2014-01-19 NOTE — Telephone Encounter (Signed)
Advised pt that we will make necessary changes after we receive results from BMET today.

## 2014-01-19 NOTE — Telephone Encounter (Signed)
Pt came in states Potassium Glucinate she was taking prior to hospital was 550 mg. Please call

## 2014-01-30 ENCOUNTER — Ambulatory Visit (INDEPENDENT_AMBULATORY_CARE_PROVIDER_SITE_OTHER): Payer: BC Managed Care – PPO | Admitting: Pulmonary Disease

## 2014-01-30 ENCOUNTER — Encounter: Payer: Self-pay | Admitting: Pulmonary Disease

## 2014-01-30 VITALS — BP 128/74 | HR 113 | Ht 64.5 in | Wt 190.0 lb

## 2014-01-30 DIAGNOSIS — J189 Pneumonia, unspecified organism: Secondary | ICD-10-CM

## 2014-01-30 DIAGNOSIS — Z23 Encounter for immunization: Secondary | ICD-10-CM

## 2014-01-30 DIAGNOSIS — J4489 Other specified chronic obstructive pulmonary disease: Secondary | ICD-10-CM

## 2014-01-30 DIAGNOSIS — J449 Chronic obstructive pulmonary disease, unspecified: Secondary | ICD-10-CM

## 2014-01-30 MED ORDER — ALBUTEROL SULFATE HFA 108 (90 BASE) MCG/ACT IN AERS: 2.0000 | INHALATION_SPRAY | Freq: Four times a day (QID) | RESPIRATORY_TRACT | Status: DC | PRN

## 2014-01-30 NOTE — Assessment & Plan Note (Signed)
COPD: GOLD GRADE A (Stage 2 airflow obstruction) Combined recommendations from the [place], [employer], Investment banker, corporate, [place] (Qaseem A et al, Ann Intern Med. 2011;155(3):179) recommends tobacco cessation, pulmonary rehab (for symptomatic patients with an FEV1 < 50% predicted), supplemental oxygen (for patients with SaO2 <88% or paO2 <55), and appropriate bronchodilator therapy.  In regards to long acting bronchodilators, they recommend monotherapy (FEV1 60-80% with symptoms weak evidence, FEV1 with symptoms <60% strong evidence), or combination therapy (FEV1 <60% with symptoms, strong recommendation, moderate evidence).  One should also provide patients with annual immunizations and consider therapy for prevention of COPD exacerbations (ie. roflumilast or azithromycin) when appopriate.  -O2 therapy: Not indicated -Immunizations: Pneumovax today, flu shot tomorrow -Tobacco use: Quit 5 years ago -Exercise: Encouraged regular exercise -Bronchodilator therapy: Stop Advair as she is asymptomatic, prescription for as needed albuterol prescribed and appropriate use discussed today -Exacerbation prevention: Not indicated -Check alpha-1 anti-trypsin level considering emphysema seen on CT chest with minimal smoking history

## 2014-01-30 NOTE — Assessment & Plan Note (Signed)
This appears to have resolved.  We need to get a chest x-ray to make sure that the radiographic changes have resolved.  Plan: -Chest x-ray

## 2014-01-30 NOTE — Patient Instructions (Addendum)
Use albuterol every 4 hours as needed for shortness of breath (2 puffs) We will see you tomorrow for blood work and the flu shot We will see you back in three months or sooner if needed for your COPD

## 2014-01-30 NOTE — Progress Notes (Signed)
Subjective:    Patient ID: Brenda [name], female    DOB: [birth year], 52 y.o.   MRN: [id number]  HPI PCP: Brenda [name] (Gaylord Shih, [address] [place])  Brenda [name] is here to see for hospital follow up from a month ago.  She was hospitalized fo pneumonia and c.diff.  She originally noted upper airway symptoms which she attributed to allergies.  She then developed more weakness and shortness of breath so she went to the ER.  She was hospitalized for six days and was treated with IV antibiotics flr pneumonia and c.diff.  She had undergone knee surgery on February 14 and maybe received antibiotics then, but recalls no other antibiotics after that.    She is now one month after her hospitalization and she has been feeling OK.  She has been taking oral vancomycin and recently finished it (last week).   She was discharged on oxygen, but she felt fairly well on discharge.  She continues to have no shortness of breath now.  She is now off the oxygen and has remained off for several weeks.  She basically only used it for a few week. She was discharged on Advair and wants to know if she still needs that.    Prior to this she had recurrent episodes of bronchitis (when she was smoking more) but this had actually been better for the last few years.  She has been in [place] for ten years and has no had respiratory problems until this year.  She quit smoking 5 1/2 years > 1/2 pack packs per day off an on from age 3 through at 51.    Past Medical History  Diagnosis Date  . Anemia   . COPD (chronic obstructive pulmonary disease)     a. 12/2013 CTA Chest: No PE. Peripheral ground-glass opacities and interstitial thickening, most pronounced in the mid & lower lung zones.  . Clostridium difficile colitis     a. 12/2013.  Marland Kitchen Atypical pneumonia     a. 12/2013  . Chronic diastolic CHF (congestive heart failure)     a. 12/2013 Echo: EF 50-55%, ? inferoseptal and basal inferior HK, diastolic dysfxn;  b. 12/2991 Limited  echo with contrast: EF 55-60%, no rwma.  . Lower extremity edema      Family History  Problem Relation Age of Onset  . Cancer Mother     bone  . Cancer Maternal Aunt     breast  . Cancer Maternal Grandmother     lung     History   Social History  . Marital Status: Married    Spouse Name: N/A    Number of Children: N/A  . Years of Education: N/A   Occupational History  . Not on file.   Social History Main Topics  . Smoking status: Former Smoker -- 0.50 packs/day for 8 years    Types: Cigarettes    Quit date: 01/30/2009  . Smokeless tobacco: Never Used  . Alcohol Use: Yes     Comment: SOCIAL DRINKER  . Drug Use: No  . Sexual Activity: Not on file   Other Topics Concern  . Not on file   Social History Narrative  . No narrative on file     Allergies  Allergen Reactions  . Codeine Nausea And Vomiting  . Penicillins     DOES NOT KNOW WHAT TYPE OF REACTION  . Shellfish Allergy Diarrhea and Nausea And Vomiting  . Spiriva [Tiotropium Bromide Monohydrate] Other (See Comments)  CLOSES THROAT     Outpatient Prescriptions Prior to Visit  Medication Sig Dispense Refill  . aspirin 81 MG tablet Take 81 mg by mouth daily.      . cyanocobalamin (,VITAMIN B-12,) 1000 MCG/ML injection Inject 1,000 mcg into the muscle every 30 (thirty) days.      . Fluticasone-Salmeterol (ADVAIR DISKUS) 250-50 MCG/DOSE AEPB Inhale 1 puff into the lungs 2 (two) times daily.      . furosemide (LASIX) 20 MG tablet Take 1 tablet (20 mg total) by mouth daily.  30 tablet  0  . metoprolol (LOPRESSOR) 50 MG tablet Take 50 mg by mouth 2 (two) times daily.       No facility-administered medications prior to visit.      Review of Systems  Constitutional: Negative for fever and unexpected weight change.  HENT: Negative for congestion, dental problem, ear pain, nosebleeds, postnasal drip, rhinorrhea, sinus pressure, sneezing, sore throat and trouble swallowing.   Eyes: Negative for redness and  itching.  Respiratory: Negative for cough, chest tightness, shortness of breath and wheezing.   Cardiovascular: Negative for palpitations and leg swelling.  Gastrointestinal: Negative for nausea and vomiting.  Genitourinary: Negative for dysuria.  Musculoskeletal: Negative for joint swelling.  Skin: Negative for rash.  Neurological: Negative for headaches.  Hematological: Does not bruise/bleed easily.  Psychiatric/Behavioral: Negative for dysphoric mood. The patient is not nervous/anxious.        Objective:   Physical Exam Filed Vitals:   01/30/14 1613  BP: 128/74  Pulse: 113  Height: 5' 4.5" (1.638 m)  Weight: 190 lb (86.183 kg)  SpO2: 100%   RA ambulated 500 feet on room air and her oxygenation remained above 94%.  Gen: well appearing, no acute distress HEENT: NCAT, PERRL, EOMi, OP clear, neck supple without masses PULM: CTA B CV: RRR, no mgr, no JVD AB: BS+, soft, nontender, no hsm Ext: warm, no edema, no clubbing, no cyanosis Derm: no rash or skin breakdown Neuro: A&Ox4, CN II-XII intact, strength 5/5 in all 4 extremities  12/2013 CTA Chest: No PE. Peripheral ground-glass opacities and interstitial thickening, most pronounced in the mid & lower lung zones. Mild emphysema in upper lobes bilaterally 01/30/14 Simple spirometry > clear airflow obstruction, ratio 68% FEV1 1.48 (55% pred)    Assessment & Plan:   COPD (chronic obstructive pulmonary disease) COPD: GOLD GRADE A (Stage 2 airflow obstruction) Combined recommendations from the [place], [employer]icians, Investment banker, corporate, [place] (Qaseem A et al, Ann Intern Med. 2011;155(3):179) recommends tobacco cessation, pulmonary rehab (for symptomatic patients with an FEV1 < 50% predicted), supplemental oxygen (for patients with SaO2 <88% or paO2 <55), and appropriate bronchodilator therapy.  In regards to long acting bronchodilators, they recommend  monotherapy (FEV1 60-80% with symptoms weak evidence, FEV1 with symptoms <60% strong evidence), or combination therapy (FEV1 <60% with symptoms, strong recommendation, moderate evidence).  One should also provide patients with annual immunizations and consider therapy for prevention of COPD exacerbations (ie. roflumilast or azithromycin) when appopriate.  -O2 therapy: Not indicated -Immunizations: Pneumovax today, flu shot tomorrow -Tobacco use: Quit 5 years ago -Exercise: Encouraged regular exercise -Bronchodilator therapy: Stop Advair as she is asymptomatic, prescription for as needed albuterol prescribed and appropriate use discussed today -Exacerbation prevention: Not indicated -Check alpha-1 anti-trypsin level considering emphysema seen on CT chest with minimal smoking history   Atypical pneumonia This appears to have resolved.  We need to get a chest x-ray to make sure  that the radiographic changes have resolved.  Plan: -Chest x-ray   Updated Medication List Outpatient Encounter Prescriptions as of 01/30/2014  Medication Sig  . aspirin 81 MG tablet Take 81 mg by mouth daily.  . cyanocobalamin (,VITAMIN B-12,) 1000 MCG/ML injection Inject 1,000 mcg into the muscle every 30 (thirty) days.  . Fluticasone-Salmeterol (ADVAIR DISKUS) 250-50 MCG/DOSE AEPB Inhale 1 puff into the lungs 2 (two) times daily.  . furosemide (LASIX) 20 MG tablet Take 1 tablet (20 mg total) by mouth daily.  . metoprolol (LOPRESSOR) 50 MG tablet Take 50 mg by mouth 2 (two) times daily.  Marland Kitchen albuterol (PROVENTIL HFA;VENTOLIN HFA) 108 (90 BASE) MCG/ACT inhaler Inhale 2 puffs into the lungs every 6 (six) hours as needed for wheezing or shortness of breath.

## 2014-01-31 ENCOUNTER — Ambulatory Visit (INDEPENDENT_AMBULATORY_CARE_PROVIDER_SITE_OTHER)
Admission: RE | Admit: 2014-01-31 | Discharge: 2014-01-31 | Disposition: A | Discharge status: Home / Self Care | Payer: BC Managed Care – PPO | Source: Ambulatory Visit | Attending: Pulmonary Disease | Admitting: Pulmonary Disease

## 2014-01-31 ENCOUNTER — Other Ambulatory Visit (INDEPENDENT_AMBULATORY_CARE_PROVIDER_SITE_OTHER): Payer: BC Managed Care – PPO

## 2014-01-31 ENCOUNTER — Ambulatory Visit: Payer: BC Managed Care – PPO

## 2014-01-31 DIAGNOSIS — J449 Chronic obstructive pulmonary disease, unspecified: Secondary | ICD-10-CM

## 2014-01-31 DIAGNOSIS — Z23 Encounter for immunization: Secondary | ICD-10-CM

## 2014-02-02 ENCOUNTER — Telehealth: Payer: Self-pay | Admitting: Pulmonary Disease

## 2014-02-02 NOTE — Telephone Encounter (Signed)
Spoke with patient-aware of CXR results and knows once we have results of labs we will call her again. Nothing more needed at this time.

## 2014-02-02 NOTE — Telephone Encounter (Signed)
Notes Recorded by Juanito Doom, MD on 02/01/2014 at 3:24 PM Caryl Pina,  Please let her noted that this was okay  Thanks, Lisabeth Register the pt and had to [facility]

## 2014-02-02 NOTE — Telephone Encounter (Signed)
Pt returning call.Brenda [name] ° °

## 2014-02-02 NOTE — Progress Notes (Signed)
Quick Note:  See 02/02/14   ______

## 2014-02-02 NOTE — Progress Notes (Signed)
Quick Note:  lmtcb X1 to relay results. ______ 

## 2014-02-07 LAB — ALPHA-1-ANTITRYPSIN PHENOTYP: A1 ANTITRYPSIN: 180 mg/dL (ref 90–200)

## 2014-02-10 ENCOUNTER — Encounter: Payer: Self-pay | Admitting: Pulmonary Disease

## 2014-02-12 ENCOUNTER — Telehealth: Payer: Self-pay | Admitting: Pulmonary Disease

## 2014-02-12 NOTE — Telephone Encounter (Signed)
Message copied by Len Blalock on Mon Feb 12, 2014  3:27 PM ------      Message from: Juanito Doom      Created: Sat Feb 10, 2014  3:45 AM       A,       Please let her know that this was normal      Thanks      B ------

## 2014-02-12 NOTE — Telephone Encounter (Signed)
Pt aware of alpha-1 antitrypsin phenotype results.  Nothing further needed.

## 2014-02-12 NOTE — Progress Notes (Signed)
Quick Note:  lmtcb X1 to relay results. ______ 

## 2014-02-15 ENCOUNTER — Ambulatory Visit: Payer: Self-pay | Admitting: Internal Medicine

## 2014-03-18 ENCOUNTER — Ambulatory Visit: Payer: Self-pay | Admitting: Internal Medicine

## 2014-03-23 ENCOUNTER — Telehealth: Payer: Self-pay | Admitting: Pulmonary Disease

## 2014-03-23 NOTE — Telephone Encounter (Signed)
Called and spoke to pt. Pt stated she need the second part of the pneumovax. Informed pt she received the full dose and a second dose is not needed. Pt verbalized understanding and denied any further questions or concerns at this time.

## 2014-04-17 ENCOUNTER — Ambulatory Visit: Payer: Self-pay | Admitting: Internal Medicine

## 2014-05-18 ENCOUNTER — Ambulatory Visit: Payer: Self-pay | Admitting: Internal Medicine

## 2014-06-18 ENCOUNTER — Ambulatory Visit: Payer: Self-pay | Admitting: Internal Medicine

## 2014-07-17 ENCOUNTER — Ambulatory Visit
Admit: 2014-07-17 | Disposition: A | Discharge status: Home / Self Care | Payer: Self-pay | Attending: Internal Medicine | Admitting: Internal Medicine

## 2014-08-17 ENCOUNTER — Ambulatory Visit
Admit: 2014-08-17 | Disposition: A | Discharge status: Home / Self Care | Payer: Self-pay | Attending: Internal Medicine | Admitting: Internal Medicine

## 2014-09-08 NOTE — H&P (Signed)
PATIENT NAME:  Brenda [name], Brenda [name] MR#:  [id number] DATE OF BIRTH:  Dec 29, 1960  DATE OF ADMISSION:  12/22/2013  PRIMARY CARE PHYSICIAN: Elgie Collard, MD  EMERGENCY ROOM PHYSICIAN: Loura Pardon, MD  CHIEF COMPLAINT: Shortness of breath.   HISTORY OF PRESENT ILLNESS: The patient is a 54 year old female who came in because of trouble breathing. The patient started to have shortness of breath for about 2 weeks and getting progressively worse. The patient initially started to have allergy symptoms then progressed to cough, phlegm and shortness of breath. The patient tried to use her inhalers without any relief. In the ER, the patient was found to have tachycardia with heart rate 141, and the patient was found to have bilateral pneumonia. She says that the symptoms started as allergies then gradually progressed. Denies any orthopnea, PND or pedal edema, denies any chest pain, but complains of cough and trouble breathing. Even with minimal exertion she fells short of breath. The patient initially thought she had an anxiety attack, but says that this trouble breathing was worse.   PAST MEDICAL HISTORY: Significant for history of pedal edema, takes Lasix for that, but denies any history of hypertension or diabetes.  PAST SURGICAL HISTORY: Significant for history of knee surgery, left shoulder surgery and polyp removed from the throat.   SOCIAL HISTORY: Previous smoker, quit 5 years ago. No alcohol. No drugs. Lives with husband.   FAMILY HISTORY: There is hypertension in the family. Paternal aunt had breast cancer and maternal grandmother had lung cancer.   MEDICATIONS:  1.  Albuterol 90 mcg 2 puffs 4 times daily. 2.  Lasix 20 mg p.o. daily. 3.  Potassium gluconate 1 tablet daily.   REVIEW OF SYSTEMS: CONSTITUTIONAL: The patient complains of fatigue and subjective fever sensation.  EYES: No blurred vision.  ENT: No tinnitus. No epistaxis. No difficulty swallowing.  RESPIRATORY: Complains of  shortness of breath for 2 weeks. Also has some cough. Denies any history of COPD. Denies asthma.  CARDIOVASCULAR: No chest pain. No orthopnea. No PND. No pedal edema. No palpitations.  GASTROINTESTINAL: No nausea. No vomiting. Complains of poor appetite.  GENITOURINARY: No dysuria.  ENDOCRINE: No polyuria or polydipsia.  INTEGUMENT: No skin rashes.  MUSCULOSKELETAL: The patient denies any joint pain.  NEUROLOGIC: No numbness or weakness.  PSYCHIATRIC: No anxiety or insomnia.   PHYSICAL EXAMINATION: VITAL SIGNS: Temperature 98.1, heart rate 141, blood pressure 122/96, saturation 95% on room air. GENERAL: The patient is an alert, awake and oriented 54 year old female in slight distress because of breathing, and the patient's heart rate goes up to 130s when she tries to talk.  HEAD: Normocephalic, atraumatic.  EYES: Pupils equal and reacting to light. Extraocular movements intact.  ENT: The patient has no tympanic membrane congestion. No turbinate hypertrophy. No oropharyngeal erythema.  NECK: Supple. No JVD. No carotid bruit.  CARDIOVASCULAR: S1 and S2 regular, tachycardic. The patient's PMI is not displaced. No peripheral edema.  LUNGS: Not using accessory muscles. Lungs with decreased bilateral air entry. I did not hear any crackles, but very faint wheezes observed. ABDOMEN: Soft. Bowel sounds present. Obese, nontender, nondistended. No hernias.  MUSCULOSKELETAL: The patient is able to move extremities x4. No tenderness or effusion.  SKIN: Inspection is within normal limits.  LYMPH: No lymphadenopathy in cervical or axillary region.   NEUROLOGIC: Cranial nerves II through XII intact. Power 5/5 in upper and lower extremities. Sensation intact. DTRs 2+ bilaterally.  PSYCHIATRIC: Mood and affect are within normal limits.   DIAGNOSTIC DATA: Electrolytes:  Sodium 128, potassium 2.8, chloride 87, bicarb 17, BUN 7, creatinine 1.45, glucose 110.   BNP slightly up at 888.   Magnesium 1.3,  phosphorus 1.1.   Troponin less than 0.02. WBC 12, hemoglobin 13.8, hematocrit 42.1, platelets 149,000.   Chest x-ray shows bilateral airspace disease, left greater than the right.   Urine WBC 14.   Chest angio of chest shows peripheral ground-glass opacities, interstitial thickening consistent with infection or inflammation like cryptogenic organizing pneumonia.    Lactic acid 5, pH 7.37, CO2 24, PO2 81.   EKG showed sinus tachycardia with no ST-T changes.   ASSESSMENT AND PLAN: 1.  This patient is a 54 year old female patient with trouble breathing and cough due to bilateral pneumonia. The patient is admitted to medical service on telemetry. Continue Levaquin along with nebulizers and Solu-Medrol and oxygen and see how she does.  2.  Lactic acidosis secondary to bilateral pneumonia. The patient has evidence of severe hyponatremia, hypokalemia, hypomagnesemia and hypophosphatemia with multiple electrolyte abnormalities. Continue IV hydration with normal saline along with p.o. potassium and also replace the magnesium as well in IV and see how she does.  3.  Slightly elevated BNP. The patient does not look like she is in congestive heart failure. At this time, continue aggressive hydration, check echocardiogram and cycle the cardiac markers.  4.  History of tobacco abuse, now quit. The patient does have some cryptogenic pneumonia features so continue pneumonia treatment and see how she does.  5.  Gastrointestinal prophylaxis and deep vein thrombosis prophylaxis.  6.  Mild urinary tract infection. She is already on Levaquin so follow blood cultures and urine cultures.   TIME SPENT ON HISTORY AND PHYSICAL: 55 minutes.    ____________________________ Epifanio Lesches, MD sk:sb D: 12/22/2013 16:51:24 ET T: 12/22/2013 17:18:55 ET JOB#: [id number]  cc: Epifanio Lesches, MD, <Dictator> Epifanio Lesches MD ELECTRONICALLY SIGNED 01/24/2014 12:10

## 2014-09-08 NOTE — Consult Note (Signed)
Present Illness Patient is a 55 yo with no prior cardiac history  A somewhat difficult historian She says she has had diarrhea for weeks. A few wks ago she then began having upper respiriatory difficulty (nose/sinuses)  This went to throat and chest  Became more SOB With activty she gets very weak and gives out. Deneis CP or arm pain  Admitted on 8/7 with these symptoms  CXR with ground glass appearance  WBC increased  Stool positive of  C difficile. Echo was a difficult study  I reviewed  There appeared to be hypokiensis of the inferior/inferoseptal walls  She denies syncope but does say she is dizzy with standing.    Codeine: N/V  Shellfish: N/V  PCN: Unknown  Spiriva: Swelling  Case History and Physical Exam:  Chief Complaint Shortness of Breath   Past Medical Health LE edema   Past Surgical History None   Family History no history of premature CAD   HEENT PERLA   Neck/Nodes JVP is normal  No bruits   Chest/Lungs Clear   Breasts Not examined   Cardiovascular RRR.  S1, S2  no S3  No murmurs  2+ distal pulses  No LE edema   Abdomen Supple  No hepatomegaly  no significatn tenderness   Rectal Not examined   Musculoskeletal Full range of motion   Neurological Grossly WNL   Skin Sl tenting of skin.   Nursing/Ancillary Notes: **Vital Signs.:   09-Aug-15 11:00  Vital Signs Type Post Medication  Pulse Pulse 75  Systolic BP Systolic BP 696  Diastolic BP (mmHg) Diastolic BP (mmHg) 75  Mean BP 95    11:05  Vital Signs Type Routine  Temperature Temperature (F) 98.6  Celsius 37  Pulse Pulse 86  Respirations Respirations 18  Systolic BP Systolic BP 789  Diastolic BP (mmHg) Diastolic BP (mmHg) 89  Mean BP 108  Pulse Ox % Pulse Ox % 94  Pulse Ox Activity Level  At rest  Oxygen Delivery 2L  *Intake and Output.:   Shift 09-Aug-15 15:00  Grand Totals Intake:  960 Output:      Net:  381 01 Hr.:  960  Oral Intake      In:  960  Length of Stay Totals Intake:   4860 Output:      Net:  7510   LabObservation:  08-Aug-15 10:27   OBSERVATION Reason for Test  Cardiology:  08-Aug-15 10:27   Echo Doppler REASON FOR EXAM:     COMMENTS:     PROCEDURE: [facility] - ECHO DOPPLER COMPLETE(TRANSTHOR)  - Dec 23 2013 10:27AM   RESULT: Echocardiogram Report  Patient Name:   ADDALYNE VANDEHEI [facility] Date of Exam: 12/23/2013 Medical Rec #:  [id number]       Custom1: Date of Birth:  [birth year]    Height:       64.0 in Patient Age:    52 years     Weight:       209.0 lb Patient Gender: F            BSA:          1.99 m??  Indications: CHF Sonographer:    Arville Go RDCS Referring Phys: Epifanio Lesches  Sonographer Comments: Technically difficult study due to poor echo  windows and suboptimal subcostal window.  Summary:  1. Left ventricular ejection fraction, by visual estimation, is 50 to  55%.  2. Mildly decreased global left ventricular systolic function.  3. Entire inferior septum and basal  inferior segment are abnormal.  4. Impaired relaxation pattern of LV diastolic filling.  5. Mildly increased left ventricular septal thickness.  6. Mild aortic valve sclerosis without stenosis.  7. Mildly increased left ventricular posterior wall thickness.  8. Mild tricuspid regurgitation. 2D AND M-MODE MEASUREMENTS (normal ranges within parentheses): Left Ventricle:          Normal IVSd (2D):      1.21 cm (0.7-1.1) LVPWd (2D):     1.32 cm (0.7-1.1) Aorta/LA:                  Normal LVIDd (2D):     4.13 cm (3.4-5.7) Aortic Root (2D): 3.00 cm (2.4-3.7) LVIDs (2D):     3.07 cm           Left Atrium (2D): 2.20 cm (1.9-4.0) LV FS (2D):     25.7 %   (>25%) LV EF (2D):     51.0 %   (>50%)                                   Right Ventricle:                                   RVd (2D): LV DIASTOLIC FUNCTION: MV Peak E: 0.27 m/s Decel Time: 206 msec MV Peak A: 0.33 m/s E/A Ratio: 0.81 SPECTRAL DOPPLER ANALYSIS (where applicable): Mitral Valve: MV P1/2 Time: 59.74 msec MV  Area, PHT: 3.68 cm?? Aortic Valve: AoV Max Vel: 0.94 m/s AoV Peak PG: 3.5 mmHg AoV Mean PG: LVOT Vmax: 0.78 m/s LVOT VTI:  LVOT Diameter: 1.80 cm AoV Area, Vmax: 2.10 cm?? AoV Area, VTI:  AoV Area, Vmn: Tricuspid Valve and PA/RV Systolic Pressure: TR Max Velocity: 2.38 m/s RA  Pressure: 5 mmHg RVSP/PASP: 27.7 mmHg Pulmonic Valve: PV Max Velocity: 0.86 m/s PV Max PG: 2.9 mmHg PV Mean PG:  PHYSICIAN INTERPRETATION: Left Ventricle: The left ventricular internal cavity size was normal. LV  septal wall thickness was mildly increased. LV posterior wall thickness  was mildly increased. Global LV systolic function was mildly decreased.   Left ventricular ejection fraction, by visual estimation, is 50 to 55%.  Spectral Doppler shows impaired relaxation pattern of LV diastolic  filling.  LV Wall Scoring: The entire inferior septum and basal inferior segment are hypokinetic. Right Ventricle: The right ventricular size is normal. Global RV systolic  function is normal. Left Atrium: The left atrium is normal in size. Right Atrium: The right atrium is normal in size. Pericardium: There is no evidence of pericardial effusion. Mitral Valve: The mitral valve is normal in structure. Tricuspid Valve: The tricuspid valve is normal. Mild tricuspid  regurgitation is visualized. The tricuspid regurgitant velocity is 2.38  m/s, and with an assumed right atrial pressure of 5 mmHg, the estimated   right ventricular systolic pressure is normal at 27.7 mmHg. Aortic Valve: Mild aortic valve sclerosis is present, with no evidence of  aortic valve stenosis. No evidence of aortic valve regurgitation is seen. Pulmonic Valve: The pulmonic valve is not well seen. No indication of  pulmonicvalve regurgitation. Venous: The inferior vena cava was normal.  [id number] Dorris Carnes MD Electronically signed by [id number] Dorris Carnes MD Signature Date/Time: 12/23/2013/1:43:19 PM  *** Final ***  IMPRESSION: .  Verified By: Fay Records, M.D., MD  Routine Chem:  08-Aug-15 05:26  Glucose, Serum  266  BUN  5  Creatinine (comp) 0.85  Sodium, Serum  131  Potassium, Serum  2.9  Chloride, Serum  97  CO2, Serum 21  Calcium (Total), Serum  8.1  Anion Gap 13  Osmolality (calc) 269  eGFR (African American) >60  eGFR (Non-African American) >60 (eGFR values <24m/min/1.73 m2 may be an indication of chronic kidney disease (CKD). Calculated eGFR is useful in patients with stable renal function. The eGFR calculation will not be reliable in acutely ill patients when serum creatinine is changing rapidly. It is not useful in  patients on dialysis. The eGFR calculation may not be applicable to patients at the low and high extremes of body sizes, pregnant women, and vegetarians.)  Magnesium, Serum  1.5 (1.8-2.4 THERAPEUTIC RANGE: 4-7 mg/dL TOXIC: > 10 mg/dL  -----------------------)  09-Aug-15 05:23   Glucose, Serum  176  BUN 8  Creatinine (comp) 0.73  Sodium, Serum  134  Potassium, Serum  3.4  Chloride, Serum 99  CO2, Serum  17  Calcium (Total), Serum  8.1  Anion Gap  18  Osmolality (calc) 271  eGFR (African American) >60  eGFR (Non-African American) >60 (eGFR values <622mmin/1.73 m2 may be an indication of chronic kidney disease (CKD). Calculated eGFR is useful in patients with stable renal function. The eGFR calculation will not be reliable in acutely ill patients when serum creatinine is changing rapidly. It is not useful in  patients on dialysis. The eGFR calculation may not be applicable to patients at the low and high extremes of body sizes, pregnant women, and vegetarians.)  Magnesium, Serum 1.9 (1.8-2.4 THERAPEUTIC RANGE: 4-7 mg/dL TOXIC: > 10 mg/dL  -----------------------)  Result Comment PHOSPHORUS - RESULTS VERIFIED BY REPEAT TESTING.  - NOTIFIED OF CRITICAL VALUE  - C/LESLIE CULBERSON AT 1119 12/24/13-DAS  - READ-BACK PROCESS PERFORMED.  Result(s) reported on 24 Dec 2013 at 11:22AM.   Phosphorus, Serum  0.3  Routine Hem:  08-Aug-15 05:26   WBC (CBC) 9.9  RBC (CBC)  3.52  Hemoglobin (CBC) 13.1  Hematocrit (CBC) 37.2  Platelet Count (CBC)  123  MCV  106  MCH  37.1  MCHC 35.1  RDW 12.7  Neutrophil % 94.7  Lymphocyte % 2.3  Monocyte % 2.7  Eosinophil % 0.0  Basophil % 0.3  Neutrophil #  9.4  Lymphocyte #  0.2  Monocyte # 0.3  Eosinophil # 0.0  Basophil # 0.0 (Result(s) reported on 23 Dec 2013 at 05:44AM.)   XRay:    09-Aug-15 09:18, Chest Portable Single View  Chest Portable Single View   REASON FOR EXAM:    SOB  COMMENTS:       PROCEDURE: DXR - DXR PORTABLE CHEST SINGLE VIEW  - Dec 24 2013  9:18AM     CLINICAL DATA:  Shortness of breath.    EXAM:  PORTABLE CHEST - 1 VIEW    COMPARISON:  CTA of the chest on 12/22/2013 as well as chestx-rays  on 12/22/2013 and 03/15/2009.    FINDINGS:  There is worsening coarse airspace and interstitial disease  localizing primarily to the peripheral lung zones. He etiology may  be infectious or inflammatory. No pneumothorax or significant  component of pleural fluid. The heart size and mediastinal contours  remain normal.     IMPRESSION:  Worsening airspace and interstitial lung disease in both lungs,  localizing more in the peripheral lung zones. Etiology may be  infectious or inflammatory.      Electronically Signed  By: Aletta Edouard M.D.    On: 12/24/2013 10:01     Verified By: Azzie Roup, M.D.,    Impression Patient is a 54 yo with no prior cardiac history  Remote tobacco history.   Admitted on 8/7 with SOB and diarrhea with associated lab abnormalities  CXR with ground class appearance.  Echo was difficult  LVEF appeared to be 50 to 55%  with inferior/inferoseptal hypokiensis. EKG shows ST at 137 bpm  SOB  There could be different etiologies  1.  Patient being treated for atypical pneumonia. Will need f/u scans.  2.  patient is tachycardic (EKG on 8/7 with ST 137 bpm  Tele with SR/ST)  and on exam appears to be a little dry  Would check orthostatics.  Hydrate as needed   3  Echo with possible wall motion abnormalities    SOB could represent CAD  Would order with contrast echo (limited) to confirm wall motion.  If truly abnormal would consider cardiac cath to evaluate anatomy given symtoms  Would be done when diarrhea resolved  If wall motion turns out to be normal would recomm at the least a Lexiscan myoview once diarrhea subsided.   Electronic Signatures: Dorris Carnes (MD)  (Signed 09-Aug-15 14:31)  Authored: General Aspect/Present Illness, Allergies, History and Physical Exam, Vital Signs, Labs, Radiology, Impression/Plan   Last Updated: 09-Aug-15 14:31 by Dorris Carnes (MD)

## 2014-09-08 NOTE — Discharge Summary (Signed)
PATIENT NAME:  Brenda [name], Brenda [name] MR#:  [id number] DATE OF BIRTH:  1960/07/19  DATE OF ADMISSION:  12/22/2013 DATE OF DISCHARGE:  12/28/2013  PRIMARY CARE PHYSICIAN: Elgie Collard, MD.  CHIEF COMPLAINT AT THE TIME OF ADMISSION: Shortness of breath.   ADMITTING DIAGNOSIS: Shortness of breath secondary to bilateral pneumonia, elevated BNP.   PRIMARY DISCHARGE DIAGNOSES:  1.  Acute respiratory distress secondary to atypical pneumonia and acute exacerbation of chronic obstructive pulmonary disease.  2.  Sepsis secondary to atypical pneumonia, discharging home with p.o. levofloxacin.  3.  Clostridium difficile colitis.   PROCEDURES: None.   CONSULTATIONS:  1.  Pulmonary with Dr. Mortimer Fries.  2.  Cardiology with Dr. Fletcher Anon.   BRIEF HISTORY OF PRESENT ILLNESS AND HOSPITAL COURSE:  Patient is a 54 year old female who came to the ED with a chief complaint of shortness of breath. She was admitted to the hospital with a diagnosis of pneumonia. Please review history and physical for details. The patient was also diagnosed with acute exacerbation of chronic obstructive pulmonary disease, and she was started on IV Solu-Medrol nebulizer treatments and oxygen.   The patient was continued on IV levofloxacin regarding the atypical pneumonia versus cryptogenic pneumonia. The patient was evaluated by Pulmonology, Dr. Mortimer Fries. He recommended to discharge the patient with p.o. levofloxacin and Advair once she is clinically feeling better.   For acute exacerbation of COPD, the patient was given Solu-Medrol and no other major treatments.   Clinical condition significantly improved. She is to switch to p.o. prednisone and be discharged home with the prednisone and Advair as discussed. The patient was also recommended to follow up with pulmonary rehabilitation as an outpatient.   Clostridium difficile colitis. The patient is diagnosed with Clostridium difficile colitis during the hospital course. Initially, the patient was  tried on p.o. Flagyl with no significant improvement. Subsequently, p.o. vancomycin is added. Her abdominal pain is significantly improved. Watery diarrhea is also getting better and today, the patient is reporting that she is having soft stool. Florastor is added to the regimen. The patient was kept on enteric precautions during the hospital course. Overall, her condition has improved. The plan is to discharge her home with p.o. vancomycin for another 12 days along with Florastor.  However, discussed with case management regarding p.o. vancomycin coverage. According to the case management,  patient has to pay 10 dollars of copayment from her pocket regarding p.o. vancomycin prescription.     Acute hypoxic respiratory failure, which seem to be from COPD exacerbation and pneumonia. The patient's resting pulse oximetry is at 88% on room air and she is saturating 91% on 2 liters of oxygen via nasal cannula with ambulation. Case management was consulted regarding home oxygen arrangements.   Severe electrolyte abnormalities. Her phosphorus, potassium, and magnesium were repleted.   Chronic diastolic congestive heart failure with a left ventricular ejection fraction at 50%-55%. The plan is to continue Lasix and potassium supplements. The patient was evaluated by Dr. Fletcher Anon who recommended the patient to follow up with him as an outpatient for possible outpatient stress test. A limited echocardiogram has revealed possible wall motion abnormalities. Now recommending outpatient nuclear stress, regarding which patient needs to follow up with them as an outpatient.   The patient's TSH was normal.  Overall condition is significantly improved and patient felt comfortable to be discharged home today. She has refused to get to home physical therapy.  CONDITION AT THE TIME OF DISCHARGE: Stable.   ACTIVITY: As tolerated diet.  DIET:  Healthy heart.   FOLLOWUP:  Follow up with primary care physician in 1 week.   Follow up with Dr. Fletcher Anon of   [facility] in a week.  Follow up with coronary rehabilitation as an outpatient as recommended.   Case management to arrange home oxygen on 2 liters via nasal cannula continues and portable tank.   The patient's potassium today is at 4.6, magnesium is at 1.8, phosphorus at 3.5, stool for Clostridium difficile toxin is positive.  Echocardiogram Doppler on August 8 has revealed left ventricular ejection fraction 50%-55%. Impaired relaxation pattern of the LV diastolic filling.  The patient had repeat limited echocardiogram on August 10, limited echocardiogram with contrast on August 10.  Patient to have left ventricular ejection fraction of 50%- 60%.  Normal global left ventricular systolic function. No wall motion abnormalities.  Repeat chest x-ray on August 13 has revealed improvement of the aeration with residual mild infiltrate pneumonia in the left lower lobe. Aeration of the right hemidiaphragm.   MEDICATIONS AT THE TIME OF DISCHARGE:  Albuterol  2 puffs inhalation 4 times a day as needed for shortness of breath,  prednisone 20 mg p.o. once daily for 5 days, metoprolol tartrate 50 mg p.o. q. 12 hours, fluticasone salmeterol 250/50 one puff inhalation 2 times a day, vancomycin 250 mg p.o. every 6 hours for 12 days, Lactobacillus 1 capsule p.o. 3 times a day, Levaquin 750 mg by mouth q. 12-24 hour for 5 days, potassium chloride 20 mEq p.o. once daily, aspirin 81 mg once daily. Continue home oxygen 2 liters via nasal cannula 24/7.   The diagnosis and plan of care was discussed in detail with the patient. She related  understanding of the plan.   ACTIVITY: As tolerated.   TOTAL TIME SPENT ON THE DISCHARGE: 45 minutes.   ____________________________ Nicholes Mango, MD ag:TT D: 12/28/2013 16:21:47 ET T: 12/28/2013 17:18:46 ET JOB#: [id number]  cc: Nicholes Mango, MD, <Dictator> Muhammad A. Fletcher Anon, MD Mariane Duval, MD Nicholes Mango MD ELECTRONICALLY SIGNED 01/04/2014  15:48

## 2014-09-08 NOTE — H&P (Signed)
PATIENT NAME:  Brenda [name], TURVEY MR#:  [id number] DATE OF BIRTH:  1960-12-26  DATE OF ADMISSION:  12/22/2013  ADDENDUM  The patient's diagnoses also include systemic antiinflammatory response syndrome secondary to pneumonia, evidence of tachycardia with acute renal failure and multiple electrolyte abnormalities. Her tachycardia should resolve with the fluids alone. If not, we can add small dose beta blockers.   ____________________________ Epifanio Lesches, MD sk:sb D: 12/22/2013 16:53:56 ET T: 12/22/2013 17:12:46 ET JOB#: [id number]  cc: Epifanio Lesches, MD, <Dictator> Epifanio Lesches MD ELECTRONICALLY SIGNED 01/24/2014 12:10

## 2014-09-09 NOTE — Op Note (Signed)
PATIENT NAME:  Brenda [name], Brenda [name] MR#:  [id number] DATE OF BIRTH:  03/09/1961  DATE OF PROCEDURE:  06/03/2011  PREOPERATIVE DIAGNOSIS: Bilateral vocal cord polyps.   POSTOPERATIVE DIAGNOSIS: Bilateral vocal cord polyps.   PROCEDURE: Microlaryngoscopy with excision of bilateral vocal cord polyps.   SURGEON: Osie Cheeks, MD   ANESTHESIA: General endotracheal.   INDICATIONS: The patient is with a history of hoarseness with noted bilateral vocal cord polyps on exam.   FINDINGS: The patient had bilateral vocal cord polyps. On the right cord there were bilobed polyps, one involving the immediate subglottic tissues and the second lobe of polyp emanating from the superior aspect of the vocal cord.   COMPLICATIONS: None.   DESCRIPTION OF PROCEDURE: After obtaining informed consent, the patient was taken to the operating room and placed in the supine position. After induction of general endotracheal anesthesia, the patient was turned 90 degrees and the head draped with the eyes protected. A tooth guard was placed to protect the upper teeth. A Dedo laryngoscope was then introduced into the airway and into the larynx. The tongue base and supraglottis were unremarkable. There were no lesions seen in the piriform sinus. With the scope in the endolarynx, the patient was placed into suspension. Each vocal cord polyp was then injected with a very small amount of lidocaine with epinephrine 1:200,000 using a butterfly needle. Photodocumentation of the polyps was obtained with the telescope. Microscope was then moved into position. The vocal cord polyps were inspected and were as described above. The right vocal cord polyp in the subglottic area was then excised removing the polyp with associated redundant mucosa leaving enough mucosa to drape over the defect. This was sent as a specimen. Next, the left vocal cord polyp was excised with microlaryngeal technique also using the microlaryngeal scissors again removing the  polyp with overlying redundant mucosa but attempting to spare as much mucosa as possible to drape over the defect. There was still quite a bit of mucosal redundancy with polypoid degeneration of the superior right cord. There was some minor polypoid edema of the left cord but not enough to warrant any further dissection. The superior aspect of the vocal cord was incised with the microlaryngeal scissors laterally and submucosal polypoid degeneration was dissected out raising a small flap and excising some of the excess mucosa. The remainder of the mucosa was then draped over the cord. Follow-up pictures were obtained and Afrin moistened pledgets placed to control minor oozing.   The patient was then returned to the anesthesiologist for awakening. She was awakened and taken to the recovery room in good condition postoperatively. Blood loss was minimal.  ____________________________ Sammuel Hines. Richardson Landry, MD psb:drc D: 06/03/2011 10:37:53 ET T: 06/03/2011 11:17:03 ET JOB#: [id number]  cc: Sammuel Hines. Richardson Landry, MD, <Dictator> Riley Nearing MD ELECTRONICALLY SIGNED 06/17/2011 17:51

## 2014-10-07 ENCOUNTER — Other Ambulatory Visit: Payer: Self-pay | Admitting: *Deleted

## 2014-10-07 ENCOUNTER — Encounter: Payer: Self-pay | Admitting: *Deleted

## 2014-10-07 DIAGNOSIS — E538 Deficiency of other specified B group vitamins: Secondary | ICD-10-CM | POA: Insufficient documentation

## 2014-10-07 HISTORY — DX: Deficiency of other specified B group vitamins: E53.8

## 2014-10-09 ENCOUNTER — Ambulatory Visit: Payer: Self-pay

## 2014-10-09 ENCOUNTER — Other Ambulatory Visit: Payer: Self-pay | Admitting: *Deleted

## 2014-10-09 ENCOUNTER — Inpatient Hospital Stay: Payer: BLUE CROSS/BLUE SHIELD | Attending: Internal Medicine

## 2014-10-09 DIAGNOSIS — E538 Deficiency of other specified B group vitamins: Secondary | ICD-10-CM | POA: Diagnosis not present

## 2014-10-09 DIAGNOSIS — Z79899 Other long term (current) drug therapy: Secondary | ICD-10-CM | POA: Diagnosis not present

## 2014-10-09 MED ORDER — CYANOCOBALAMIN 1000 MCG/ML IJ SOLN: 1000.0000 ug | Freq: Once | INTRAMUSCULAR | Status: DC

## 2014-10-09 MED ORDER — CYANOCOBALAMIN 1000 MCG/ML IJ SOLN
1000.0000 ug | Freq: Once | INTRAMUSCULAR | Status: AC
  Administered 2014-10-09: 1000 ug via INTRAMUSCULAR
  Filled 2014-10-09: qty 1

## 2014-11-13 ENCOUNTER — Inpatient Hospital Stay: Payer: 59

## 2014-11-13 ENCOUNTER — Inpatient Hospital Stay: Payer: 59 | Attending: Internal Medicine

## 2014-11-13 DIAGNOSIS — Z79899 Other long term (current) drug therapy: Secondary | ICD-10-CM | POA: Insufficient documentation

## 2014-11-13 DIAGNOSIS — E538 Deficiency of other specified B group vitamins: Secondary | ICD-10-CM | POA: Insufficient documentation

## 2014-11-13 MED ORDER — CYANOCOBALAMIN 1000 MCG/ML IJ SOLN
1000.0000 ug | Freq: Once | INTRAMUSCULAR | Status: AC
  Administered 2014-11-13: 1000 ug via INTRAMUSCULAR
  Filled 2014-11-13: qty 1

## 2014-12-11 ENCOUNTER — Inpatient Hospital Stay: Payer: 59

## 2014-12-11 ENCOUNTER — Inpatient Hospital Stay: Payer: 59 | Attending: Internal Medicine

## 2014-12-11 DIAGNOSIS — Z79899 Other long term (current) drug therapy: Secondary | ICD-10-CM | POA: Insufficient documentation

## 2014-12-11 DIAGNOSIS — E538 Deficiency of other specified B group vitamins: Secondary | ICD-10-CM | POA: Insufficient documentation

## 2014-12-11 MED ORDER — CYANOCOBALAMIN 1000 MCG/ML IJ SOLN
1000.0000 ug | Freq: Once | INTRAMUSCULAR | Status: AC
  Administered 2014-12-11: 1000 ug via INTRAMUSCULAR
  Filled 2014-12-11: qty 1

## 2015-01-08 ENCOUNTER — Inpatient Hospital Stay: Payer: 59

## 2015-01-08 ENCOUNTER — Inpatient Hospital Stay: Payer: 59 | Attending: Internal Medicine

## 2015-01-08 DIAGNOSIS — Z79899 Other long term (current) drug therapy: Secondary | ICD-10-CM | POA: Insufficient documentation

## 2015-01-08 DIAGNOSIS — E538 Deficiency of other specified B group vitamins: Secondary | ICD-10-CM | POA: Diagnosis present

## 2015-01-08 MED ORDER — CYANOCOBALAMIN 1000 MCG/ML IJ SOLN
1000.0000 ug | Freq: Once | INTRAMUSCULAR | Status: AC
  Administered 2015-01-08: 1000 ug via INTRAMUSCULAR
  Filled 2015-01-08: qty 1

## 2015-02-05 ENCOUNTER — Inpatient Hospital Stay: Payer: 59

## 2015-02-05 ENCOUNTER — Inpatient Hospital Stay: Payer: 59 | Attending: Internal Medicine

## 2015-02-05 ENCOUNTER — Encounter (INDEPENDENT_AMBULATORY_CARE_PROVIDER_SITE_OTHER): Payer: Self-pay

## 2015-02-05 DIAGNOSIS — Z79899 Other long term (current) drug therapy: Secondary | ICD-10-CM | POA: Insufficient documentation

## 2015-02-05 DIAGNOSIS — E538 Deficiency of other specified B group vitamins: Secondary | ICD-10-CM | POA: Insufficient documentation

## 2015-02-05 MED ORDER — CYANOCOBALAMIN 1000 MCG/ML IJ SOLN
1000.0000 ug | Freq: Once | INTRAMUSCULAR | Status: AC
  Administered 2015-02-05: 1000 ug via INTRAMUSCULAR
  Filled 2015-02-05: qty 1

## 2015-03-05 ENCOUNTER — Inpatient Hospital Stay: Payer: 59 | Attending: Internal Medicine

## 2015-03-05 DIAGNOSIS — Z79899 Other long term (current) drug therapy: Secondary | ICD-10-CM | POA: Insufficient documentation

## 2015-03-05 DIAGNOSIS — E538 Deficiency of other specified B group vitamins: Secondary | ICD-10-CM | POA: Insufficient documentation

## 2015-03-05 MED ORDER — CYANOCOBALAMIN 1000 MCG/ML IJ SOLN
1000.0000 ug | Freq: Once | INTRAMUSCULAR | Status: AC
  Administered 2015-03-05: 1000 ug via INTRAMUSCULAR
  Filled 2015-03-05: qty 1

## 2015-03-13 ENCOUNTER — Encounter: Payer: Self-pay | Admitting: Internal Medicine

## 2015-04-02 ENCOUNTER — Encounter: Payer: Self-pay | Admitting: Internal Medicine

## 2015-04-02 ENCOUNTER — Inpatient Hospital Stay: Payer: 59

## 2015-04-02 ENCOUNTER — Inpatient Hospital Stay: Payer: 59 | Attending: Internal Medicine | Admitting: Internal Medicine

## 2015-04-02 VITALS — BP 138/96 | HR 89 | Temp 98.5°F | Resp 18 | Ht 64.5 in | Wt 162.9 lb

## 2015-04-02 DIAGNOSIS — R600 Localized edema: Secondary | ICD-10-CM | POA: Diagnosis not present

## 2015-04-02 DIAGNOSIS — E538 Deficiency of other specified B group vitamins: Secondary | ICD-10-CM

## 2015-04-02 DIAGNOSIS — Z8701 Personal history of pneumonia (recurrent): Secondary | ICD-10-CM | POA: Insufficient documentation

## 2015-04-02 DIAGNOSIS — Z803 Family history of malignant neoplasm of breast: Secondary | ICD-10-CM | POA: Insufficient documentation

## 2015-04-02 DIAGNOSIS — Z801 Family history of malignant neoplasm of trachea, bronchus and lung: Secondary | ICD-10-CM | POA: Diagnosis not present

## 2015-04-02 DIAGNOSIS — D519 Vitamin B12 deficiency anemia, unspecified: Secondary | ICD-10-CM

## 2015-04-02 DIAGNOSIS — I10 Essential (primary) hypertension: Secondary | ICD-10-CM | POA: Diagnosis not present

## 2015-04-02 DIAGNOSIS — Z807 Family history of other malignant neoplasms of lymphoid, hematopoietic and related tissues: Secondary | ICD-10-CM | POA: Diagnosis not present

## 2015-04-02 DIAGNOSIS — Z79899 Other long term (current) drug therapy: Secondary | ICD-10-CM | POA: Insufficient documentation

## 2015-04-02 DIAGNOSIS — Z87891 Personal history of nicotine dependence: Secondary | ICD-10-CM | POA: Diagnosis not present

## 2015-04-02 DIAGNOSIS — D7589 Other specified diseases of blood and blood-forming organs: Secondary | ICD-10-CM

## 2015-04-02 MED ORDER — CYANOCOBALAMIN 1000 MCG/ML IJ SOLN
1000.0000 ug | Freq: Once | INTRAMUSCULAR | Status: AC
  Administered 2015-04-02: 1000 ug via INTRAMUSCULAR
  Filled 2015-04-02: qty 1

## 2015-04-02 NOTE — Progress Notes (Signed)
[place] @ [facility] [phone]  [phone]     Brenda [name] OB: 02-Jul-1960  MR#: [id number]  SUP#:[id number]  [name] Care Team: No Pcp Per [name] as PCP - General (General Practice)  CHIEF COMPLAINT:  Chief Complaint  [name] presents with  . b 12 deficiency   HPI: Brenda [name] has had vitamin B-12 deficiency anemia diagnosed in early 2000. At that time workup revealed no evidence of anti- parietal antibodies. Brenda [name] has been on monthly vitamin B12 injections for an extended period of time, and clinically has remained stable.    No history exists.    Oncology Flowsheet 10/09/2014 11/13/2014 12/11/2014 01/08/2015 02/05/2015 03/05/2015  cyanocobalamin ((VITAMIN B-12)) IM 1,000 mcg 1,000 mcg 1,000 mcg 1,000 mcg 1,000 mcg 1,000 mcg    INTERVAL HISTORY:  Since her last appointment Brenda [name] has done well clinically. Brenda [name] has been able to perform all activities of daily living without any significant difficulties. Brenda [name] continues to receive vitamin B12 injections on a monthly basis in our clinic. Brenda [name] has not had any significant health-related issues or concerns since her last appointment. ECOG performance status equals 0. Brenda [name] specifically denies fatigue, shortness of breath, nausea, vomiting, diarrhea, constipation, numbness tingling in fingers and toes.  REVIEW OF SYSTEMS:   As per HPI. Otherwise, a complete review of systems is negatve.  PAST MEDICAL HISTORY: Past Medical History  Diagnosis Date  . Anemia   . Clostridium difficile colitis     a. 12/2013.  Marland Kitchen Atypical pneumonia     a. 12/2013  . Lower extremity edema   . B12 deficiency 10/07/2014  . Hypertension     PAST SURGICAL HISTORY: Past Surgical History  Procedure Laterality Date  . Shoulder surgery Left   . Throat surgery    . Tonsillectomy    . Neck surgery    . Knee surgery Left 2014    FAMILY HISTORY Family History  Problem Relation Age of Onset  . Multiple myeloma Mother   . Breast cancer  Maternal Aunt   . Lung cancer Maternal Grandmother   . Parkinson's disease Father     ADVANCED DIRECTIVES:  No flowsheet data found.  HEALTH MAINTENANCE: Social History  Substance Use Topics  . Smoking status: Former Smoker -- 0.50 packs/day for 8 years    Types: Cigarettes    Quit date: 01/31/2012  . Smokeless tobacco: Never Used  . Alcohol Use: Yes     Comment: SOCIAL DRINKER 1-2 vodka a week      Allergies  Allergen Reactions  . Codeine Nausea And Vomiting  . Penicillins     DOES NOT KNOW WHAT TYPE OF REACTION  . Shellfish Allergy Diarrhea and Nausea And Vomiting  . Spiriva [Tiotropium Bromide Monohydrate] Other (See Comments)    CLOSES THROAT    Current Outpatient Prescriptions  Medication Sig Dispense Refill  . albuterol (PROVENTIL HFA;VENTOLIN HFA) 108 (90 BASE) MCG/ACT inhaler Inhale 2 puffs into Brenda lungs every 6 (six) hours as needed for wheezing or shortness of breath. 1 Inhaler 2  . aspirin 81 MG tablet Take 81 mg by mouth daily.    . Cholecalciferol (VITAMIN D-3) 1000 UNITS CAPS Take 1 capsule by mouth daily.    . cyanocobalamin (,VITAMIN B-12,) 1000 MCG/ML injection Inject 1,000 mcg into Brenda muscle every 30 (thirty) days.    . folic acid (FOLVITE) 1 MG tablet Take 1 mg by mouth daily.    . furosemide (LASIX) 20 MG tablet Take 1 tablet (20 mg total) by  mouth daily. 30 tablet 0  . metoprolol (LOPRESSOR) 50 MG tablet Take 50 mg by mouth at bedtime.     . Multiple Vitamins-Minerals (MULTIVITAMIN GUMMIES ADULT PO) Take 1 Dose by mouth daily.    . Probiotic Product (PROBIOTIC & ACIDOPHILUS EX ST PO) Take 1 Dose by mouth daily.     No current facility-administered medications for this visit.   Facility-Administered Medications Ordered in Other Visits  Medication Dose Route Frequency Provider Last Rate Last Dose  . cyanocobalamin ((VITAMIN B-12)) injection 1,000 mcg  1,000 mcg Intramuscular Once Leia Alf, MD        OBJECTIVE:  Filed Vitals:   04/02/15  1148  BP: 138/96  Pulse: 89  Temp: 98.5 F (36.9 C)  Resp: 18     Body mass index is 27.54 kg/(m^2).    ECOG FS:0 - Asymptomatic  PHYSICAL EXAM: BP 138/96 mmHg  Pulse 89  Temp(Src) 98.5 F (36.9 C) (Tympanic)  Resp 18  Ht 5' 4.5" (1.638 m)  Wt 162 lb 14.7 oz (73.9 kg)  BMI 27.54 kg/m2  General Appearance:    Alert, cooperative, no distress, appears stated age 54 female   Head:    Normocephalic, without obvious abnormality, atraumatic  Eyes:    PERRL, conjunctiva/corneas clear, EOM's intact, fundi    benign, both eyes  Ears:    Normal TM's and external ear canals, both ears  Nose:   Nares normal, septum midline, mucosa normal, no drainage    or sinus tenderness  Throat:   Lips, mucosa, and tongue normal; teeth and gums normal  Neck:   Supple, symmetrical, trachea midline, no adenopathy;    thyroid:  no enlargement/tenderness/nodules; no carotid   bruit or JVD  Back:     Symmetric, no curvature, ROM normal, no CVA tenderness  Lungs:     Clear to auscultation bilaterally, respirations unlabored  Chest Wall:    No tenderness or deformity   Heart:    Regular rate and rhythm, S1 and S2 normal, no murmur, rub   or gallop  Breast Exam:    Deferred by [name]  Abdomen:     Soft, non-tender, bowel sounds active all four quadrants,    no masses, no organomegaly  Genitalia:    Deferred by [name]  Rectal:    Deferred by [name]  Extremities:   Extremities normal, atraumatic, no cyanosis or edema  Pulses:   2+ and symmetric all extremities  Skin:   Skin color, texture, turgor normal, no rashes or lesions  Lymph nodes:   Cervical, supraclavicular, and axillary nodes normal  Neurologic:   CNII-XII intact, normal strength, sensation and reflexes    throughout      LAB RESULTS: No results found for this or any previous visit (from Brenda past 2160 hour(s)).  CBC performed on 03/13/2015 White blood cell count 6.7, absolute neutrophil count 4.6, absolute lymphocyte count 1.4,  absolute monocyte count 0.5 Hemoglobin 13.4, MCV 102, platelet count 231  STUDIES: No results found.  ASSESSMENT AND PLAN: 1. Vitamin B-12 deficiency anemia-currently hemoglobin is within normal range. We will continue with monthly vitamin B12 injections. [name] will return to our clinic in 1 year for a follow-up visit. Labs will be done at [facility] 2. Macrocytosis-with normalization of vitamin B12 levels macrocytosis continues to persist, which likely is a reflection of continues alcohol intake. Brenda [name] expressed her intention to continue drinking at current rate. There is a possibility of alcoholic steatohepatitis, which also can be associated with macrocytosis. Clinically  nonsignificant. 3. Age appropriate screening-[name] had colonoscopy 3 years ago, which revealed 2 benign polyps. Brenda [name] should have another colonoscopy in 2 years. Mammogram was performed last year, benign. [name] will receive flu vaccine locally next week.     [name] expressed understanding and was in agreement with this plan. Brenda [name] also understands that Brenda [name] can call clinic at any time with any questions, concerns, or complaints.    No matching staging information was found for Brenda [name].  Roxana Hires, MD   04/02/2015 12:11 PM

## 2015-05-07 ENCOUNTER — Inpatient Hospital Stay: Payer: 59 | Attending: Internal Medicine

## 2015-05-07 DIAGNOSIS — E538 Deficiency of other specified B group vitamins: Secondary | ICD-10-CM | POA: Insufficient documentation

## 2015-05-07 DIAGNOSIS — Z79899 Other long term (current) drug therapy: Secondary | ICD-10-CM | POA: Insufficient documentation

## 2015-05-07 MED ORDER — CYANOCOBALAMIN 1000 MCG/ML IJ SOLN
1000.0000 ug | Freq: Once | INTRAMUSCULAR | Status: AC
  Administered 2015-05-07: 1000 ug via INTRAMUSCULAR
  Filled 2015-05-07: qty 1

## 2015-06-04 ENCOUNTER — Inpatient Hospital Stay: Payer: 59 | Attending: Internal Medicine

## 2015-06-04 DIAGNOSIS — Z79899 Other long term (current) drug therapy: Secondary | ICD-10-CM | POA: Insufficient documentation

## 2015-06-04 DIAGNOSIS — E538 Deficiency of other specified B group vitamins: Secondary | ICD-10-CM | POA: Diagnosis present

## 2015-06-04 MED ORDER — CYANOCOBALAMIN 1000 MCG/ML IJ SOLN
1000.0000 ug | Freq: Once | INTRAMUSCULAR | Status: AC
  Administered 2015-06-04: 1000 ug via INTRAMUSCULAR
  Filled 2015-06-04: qty 1

## 2015-07-02 ENCOUNTER — Inpatient Hospital Stay: Payer: 59 | Attending: Internal Medicine

## 2015-07-02 ENCOUNTER — Inpatient Hospital Stay: Payer: 59

## 2015-07-02 DIAGNOSIS — D538 Other specified nutritional anemias: Secondary | ICD-10-CM | POA: Diagnosis not present

## 2015-07-02 DIAGNOSIS — Z79899 Other long term (current) drug therapy: Secondary | ICD-10-CM | POA: Insufficient documentation

## 2015-07-02 DIAGNOSIS — E538 Deficiency of other specified B group vitamins: Secondary | ICD-10-CM

## 2015-07-02 MED ORDER — CYANOCOBALAMIN 1000 MCG/ML IJ SOLN
1000.0000 ug | Freq: Once | INTRAMUSCULAR | Status: AC
  Administered 2015-07-02: 1000 ug via INTRAMUSCULAR
  Filled 2015-07-02: qty 1

## 2015-07-30 ENCOUNTER — Inpatient Hospital Stay: Payer: 59

## 2015-08-27 ENCOUNTER — Inpatient Hospital Stay: Payer: 59 | Attending: Family Medicine

## 2015-08-27 DIAGNOSIS — Z79899 Other long term (current) drug therapy: Secondary | ICD-10-CM | POA: Insufficient documentation

## 2015-08-27 DIAGNOSIS — E538 Deficiency of other specified B group vitamins: Secondary | ICD-10-CM | POA: Diagnosis present

## 2015-08-27 MED ORDER — CYANOCOBALAMIN 1000 MCG/ML IJ SOLN
1000.0000 ug | Freq: Once | INTRAMUSCULAR | Status: AC
  Administered 2015-08-27: 1000 ug via INTRAMUSCULAR
  Filled 2015-08-27: qty 1

## 2015-09-24 ENCOUNTER — Inpatient Hospital Stay: Payer: 59 | Attending: Family Medicine

## 2015-09-24 ENCOUNTER — Inpatient Hospital Stay: Payer: 59

## 2015-09-24 DIAGNOSIS — E538 Deficiency of other specified B group vitamins: Secondary | ICD-10-CM | POA: Insufficient documentation

## 2015-09-24 DIAGNOSIS — Z79899 Other long term (current) drug therapy: Secondary | ICD-10-CM | POA: Diagnosis not present

## 2015-09-24 MED ORDER — CYANOCOBALAMIN 1000 MCG/ML IJ SOLN
1000.0000 ug | Freq: Once | INTRAMUSCULAR | Status: AC
  Administered 2015-09-24: 1000 ug via INTRAMUSCULAR
  Filled 2015-09-24: qty 1

## 2015-10-22 ENCOUNTER — Inpatient Hospital Stay: Payer: BLUE CROSS/BLUE SHIELD | Attending: Family Medicine

## 2015-10-22 DIAGNOSIS — E538 Deficiency of other specified B group vitamins: Secondary | ICD-10-CM | POA: Diagnosis present

## 2015-10-22 DIAGNOSIS — Z79899 Other long term (current) drug therapy: Secondary | ICD-10-CM | POA: Diagnosis not present

## 2015-10-22 MED ORDER — CYANOCOBALAMIN 1000 MCG/ML IJ SOLN
1000.0000 ug | Freq: Once | INTRAMUSCULAR | Status: AC
  Administered 2015-10-22: 1000 ug via INTRAMUSCULAR
  Filled 2015-10-22: qty 1

## 2015-11-19 ENCOUNTER — Inpatient Hospital Stay: Payer: BLUE CROSS/BLUE SHIELD

## 2015-11-20 ENCOUNTER — Inpatient Hospital Stay: Payer: BLUE CROSS/BLUE SHIELD | Attending: Family Medicine

## 2015-11-20 DIAGNOSIS — Z79899 Other long term (current) drug therapy: Secondary | ICD-10-CM | POA: Diagnosis not present

## 2015-11-20 DIAGNOSIS — E538 Deficiency of other specified B group vitamins: Secondary | ICD-10-CM | POA: Diagnosis present

## 2015-11-20 MED ORDER — CYANOCOBALAMIN 1000 MCG/ML IJ SOLN
1000.0000 ug | Freq: Once | INTRAMUSCULAR | Status: AC
  Administered 2015-11-20: 1000 ug via INTRAMUSCULAR
  Filled 2015-11-20: qty 1

## 2015-12-17 ENCOUNTER — Inpatient Hospital Stay: Payer: BLUE CROSS/BLUE SHIELD | Attending: Internal Medicine

## 2015-12-17 DIAGNOSIS — E538 Deficiency of other specified B group vitamins: Secondary | ICD-10-CM | POA: Diagnosis present

## 2015-12-17 DIAGNOSIS — Z79899 Other long term (current) drug therapy: Secondary | ICD-10-CM | POA: Diagnosis not present

## 2015-12-17 MED ORDER — CYANOCOBALAMIN 1000 MCG/ML IJ SOLN
1000.0000 ug | Freq: Once | INTRAMUSCULAR | Status: AC
Start: 2015-12-17 — End: 2015-12-17
  Administered 2015-12-17: 1000 ug via INTRAMUSCULAR
  Filled 2015-12-17: qty 1

## 2016-01-14 ENCOUNTER — Inpatient Hospital Stay: Payer: BLUE CROSS/BLUE SHIELD

## 2016-01-14 ENCOUNTER — Encounter (INDEPENDENT_AMBULATORY_CARE_PROVIDER_SITE_OTHER): Payer: Self-pay

## 2016-01-14 DIAGNOSIS — E538 Deficiency of other specified B group vitamins: Secondary | ICD-10-CM | POA: Diagnosis not present

## 2016-01-14 MED ORDER — CYANOCOBALAMIN 1000 MCG/ML IJ SOLN
1000.0000 ug | Freq: Once | INTRAMUSCULAR | Status: AC
  Administered 2016-01-14: 1000 ug via INTRAMUSCULAR
  Filled 2016-01-14: qty 1

## 2016-02-11 ENCOUNTER — Inpatient Hospital Stay: Payer: BLUE CROSS/BLUE SHIELD | Attending: Internal Medicine

## 2016-02-11 DIAGNOSIS — E538 Deficiency of other specified B group vitamins: Secondary | ICD-10-CM | POA: Diagnosis present

## 2016-02-11 DIAGNOSIS — Z79899 Other long term (current) drug therapy: Secondary | ICD-10-CM | POA: Insufficient documentation

## 2016-02-11 MED ORDER — CYANOCOBALAMIN 1000 MCG/ML IJ SOLN
1000.0000 ug | Freq: Once | INTRAMUSCULAR | Status: AC
  Administered 2016-02-11: 1000 ug via INTRAMUSCULAR
  Filled 2016-02-11: qty 1

## 2016-03-10 ENCOUNTER — Ambulatory Visit: Payer: 59

## 2016-03-10 ENCOUNTER — Inpatient Hospital Stay: Payer: BLUE CROSS/BLUE SHIELD | Attending: Oncology

## 2016-03-10 DIAGNOSIS — E538 Deficiency of other specified B group vitamins: Secondary | ICD-10-CM | POA: Diagnosis not present

## 2016-03-10 DIAGNOSIS — Z79899 Other long term (current) drug therapy: Secondary | ICD-10-CM | POA: Insufficient documentation

## 2016-03-10 MED ORDER — CYANOCOBALAMIN 1000 MCG/ML IJ SOLN
1000.0000 ug | Freq: Once | INTRAMUSCULAR | Status: AC
  Administered 2016-03-10: 1000 ug via INTRAMUSCULAR
  Filled 2016-03-10: qty 1

## 2016-03-31 ENCOUNTER — Encounter: Payer: Self-pay | Admitting: Internal Medicine

## 2016-04-02 ENCOUNTER — Other Ambulatory Visit: Payer: Self-pay | Admitting: *Deleted

## 2016-04-07 ENCOUNTER — Ambulatory Visit: Payer: BLUE CROSS/BLUE SHIELD | Admitting: Oncology

## 2016-04-07 ENCOUNTER — Ambulatory Visit: Payer: 59

## 2016-04-13 NOTE — Progress Notes (Signed)
[place]  Telephone:(336) [phone] Fax:(336) [phone]  ID: Brenda [name] OB: Mar 15, 1961  MR#: [id number]  AUQ#:[id number]  Patient Care Team: No Pcp Per Patient as PCP - General (General Practice)  CHIEF COMPLAINT: B-12 deficiency anemia.  INTERVAL HISTORY: Patient is a 55 year old female whose last evaluated 1 year ago and returns for follow-up and continuation of monthly B-12 injections. She currently feels well and is asymptomatic. She denies any weakness or fatigue. She has good appetite and denies weight loss. She has no neurologic complaints. She denies any recent fevers or illnesses. She has no chest pain or shortness of breath. She denies any nausea, vomiting, constipation, or diarrhea. She has no urinary complaints. Patient feels at her baseline and offers no specific complaints today.  REVIEW OF SYSTEMS:   Review of Systems  Constitutional: Negative.  Negative for fever, malaise/fatigue and weight loss.  Respiratory: Negative.  Negative for cough.   Cardiovascular: Negative.  Negative for leg swelling.  Gastrointestinal: Negative.  Negative for abdominal pain, blood in stool and melena.  Genitourinary: Negative.   Musculoskeletal: Negative.   Neurological: Negative.  Negative for weakness.  Psychiatric/Behavioral: Negative.  The patient is not nervous/anxious.     As per HPI. Otherwise, a complete review of systems is negative.  PAST MEDICAL HISTORY: Past Medical History:  Diagnosis Date  . Anemia   . Atypical pneumonia    a. 12/2013  . B12 deficiency 10/07/2014  . Clostridium difficile colitis    a. 12/2013.  Marland Kitchen Hypertension   . Lower extremity edema     PAST SURGICAL HISTORY: Past Surgical History:  Procedure Laterality Date  . KNEE SURGERY Left 2014  . NECK SURGERY    . SHOULDER SURGERY Left   . THROAT SURGERY    . TONSILLECTOMY      FAMILY HISTORY: Family History  Problem Relation Age of Onset  . Multiple myeloma Mother   . Breast  cancer Maternal Aunt   . Lung cancer Maternal Grandmother   . Parkinson's disease Father     ADVANCED DIRECTIVES (Y/N):  N  HEALTH MAINTENANCE: Social History  Substance Use Topics  . Smoking status: Former Smoker    Packs/day: 0.50    Years: 8.00    Types: Cigarettes    Quit date: 01/31/2012  . Smokeless tobacco: Never Used  . Alcohol use Yes     Comment: SOCIAL DRINKER 1-2 vodka a week     Colonoscopy:  PAP:  Bone density:  Lipid panel:  Allergies  Allergen Reactions  . Codeine Nausea And Vomiting  . Penicillins     DOES NOT KNOW WHAT TYPE OF REACTION  . Shellfish Allergy Diarrhea and Nausea And Vomiting  . Spiriva [Tiotropium Bromide Monohydrate] Other (See Comments)    CLOSES THROAT    Current Outpatient Prescriptions  Medication Sig Dispense Refill  . albuterol (PROVENTIL HFA;VENTOLIN HFA) 108 (90 BASE) MCG/ACT inhaler Inhale 2 puffs into the lungs every 6 (six) hours as needed for wheezing or shortness of breath. 1 Inhaler 2  . aspirin 81 MG tablet Take 81 mg by mouth daily.    . Cholecalciferol (VITAMIN D-3) 1000 UNITS CAPS Take 1 capsule by mouth daily.    . cyanocobalamin (,VITAMIN B-12,) 1000 MCG/ML injection Inject 1,000 mcg into the muscle every 30 (thirty) days.    . folic acid (FOLVITE) 1 MG tablet Take 1 mg by mouth daily.    . furosemide (LASIX) 20 MG tablet Take 1 tablet (20 mg total) by mouth  daily. 30 tablet 0  . metoprolol (LOPRESSOR) 50 MG tablet Take 50 mg by mouth at bedtime.     . Multiple Vitamins-Minerals (MULTIVITAMIN GUMMIES ADULT PO) Take 1 Dose by mouth daily.    Marland Kitchen POTASSIUM CHLORIDE PO Take 1 tablet by mouth daily.    . Probiotic Product (PROBIOTIC & ACIDOPHILUS EX ST PO) Take 1 Dose by mouth daily.     No current facility-administered medications for this visit.     OBJECTIVE: Vitals:   04/14/16 1111  BP: (!) 137/95  Pulse: 75  Resp: 18  Temp: 99.6 F (37.6 C)     Body mass index is 28.39 kg/m.    ECOG FS:0 -  Asymptomatic  General: Well-developed, well-nourished, no acute distress. Eyes: Pink conjunctiva, anicteric sclera. HEENT: Normocephalic, moist mucous membranes, clear oropharnyx. Lungs: Clear to auscultation bilaterally. Heart: Regular rate and rhythm. No rubs, murmurs, or gallops. Abdomen: Soft, nontender, nondistended. No organomegaly noted, normoactive bowel sounds. Musculoskeletal: No edema, cyanosis, or clubbing. Neuro: Alert, answering all questions appropriately. Cranial nerves grossly intact. Skin: No rashes or petechiae noted. Psych: Normal affect. Lymphatics: No cervical, calvicular, axillary or inguinal LAD.   LAB RESULTS:  Lab Results  Component Value Date   NA 141 01/19/2014   K 3.6 01/19/2014   CL 106 01/19/2014   CO2 25 01/19/2014   GLUCOSE 101 (H) 01/19/2014   BUN 3 (L) 01/19/2014   CREATININE 0.62 01/19/2014   CALCIUM 8.1 (L) 01/19/2014   GFRNONAA >60 01/19/2014   GFRAA >60 01/19/2014    Lab Results  Component Value Date   WBC 13.1 (H) 12/26/2013   NEUTROABS 9.4 (H) 12/23/2013   HGB 12.1 12/26/2013   HCT 35.5 12/26/2013   MCV 105 (H) 12/26/2013   PLT 257 12/26/2013     STUDIES: No results found.  ASSESSMENT: B-12 deficiency anemia.  PLAN:    1. B-12 deficiency anemia: All patient's laboratory work is done at [facility]. Hemoglobin, iron stores, and B-12 levels are currently within normal limits. Continue monthly B-12 injections and return to clinic in 1 year for further evaluation. Patient has also been instructed that she will acquire a primary care physician.  Approximately 30 minutes was spent in discussion of which greater than 50% was consultation.   Patient expressed understanding and was in agreement with this plan. She also understands that She can call clinic at any time with any questions, concerns, or complaints.    Lloyd Huger, MD   04/18/2016 8:37 AM

## 2016-04-14 ENCOUNTER — Inpatient Hospital Stay: Payer: BLUE CROSS/BLUE SHIELD | Attending: Oncology | Admitting: Oncology

## 2016-04-14 ENCOUNTER — Inpatient Hospital Stay: Payer: BLUE CROSS/BLUE SHIELD

## 2016-04-14 VITALS — BP 137/95 | HR 75 | Temp 99.6°F | Resp 18 | Wt 168.0 lb

## 2016-04-14 DIAGNOSIS — Z8579 Personal history of other malignant neoplasms of lymphoid, hematopoietic and related tissues: Secondary | ICD-10-CM | POA: Diagnosis not present

## 2016-04-14 DIAGNOSIS — R609 Edema, unspecified: Secondary | ICD-10-CM | POA: Diagnosis not present

## 2016-04-14 DIAGNOSIS — E538 Deficiency of other specified B group vitamins: Secondary | ICD-10-CM | POA: Insufficient documentation

## 2016-04-14 DIAGNOSIS — I10 Essential (primary) hypertension: Secondary | ICD-10-CM | POA: Diagnosis not present

## 2016-04-14 DIAGNOSIS — Z79899 Other long term (current) drug therapy: Secondary | ICD-10-CM | POA: Diagnosis not present

## 2016-04-14 DIAGNOSIS — Z803 Family history of malignant neoplasm of breast: Secondary | ICD-10-CM | POA: Insufficient documentation

## 2016-04-14 DIAGNOSIS — Z7982 Long term (current) use of aspirin: Secondary | ICD-10-CM | POA: Insufficient documentation

## 2016-04-14 DIAGNOSIS — Z87891 Personal history of nicotine dependence: Secondary | ICD-10-CM | POA: Diagnosis not present

## 2016-04-14 DIAGNOSIS — Z8701 Personal history of pneumonia (recurrent): Secondary | ICD-10-CM | POA: Insufficient documentation

## 2016-04-14 DIAGNOSIS — Z801 Family history of malignant neoplasm of trachea, bronchus and lung: Secondary | ICD-10-CM | POA: Diagnosis not present

## 2016-04-14 DIAGNOSIS — Z8719 Personal history of other diseases of the digestive system: Secondary | ICD-10-CM | POA: Diagnosis not present

## 2016-04-14 MED ORDER — CYANOCOBALAMIN 1000 MCG/ML IJ SOLN
1000.0000 ug | Freq: Once | INTRAMUSCULAR | Status: AC
  Administered 2016-04-14: 1000 ug via INTRAMUSCULAR
  Filled 2016-04-14: qty 1

## 2016-04-14 NOTE — Progress Notes (Signed)
Offers no complaints  

## 2016-05-19 ENCOUNTER — Inpatient Hospital Stay: Payer: BLUE CROSS/BLUE SHIELD | Attending: Oncology

## 2016-05-19 DIAGNOSIS — Z79899 Other long term (current) drug therapy: Secondary | ICD-10-CM | POA: Insufficient documentation

## 2016-05-19 DIAGNOSIS — E538 Deficiency of other specified B group vitamins: Secondary | ICD-10-CM | POA: Insufficient documentation

## 2016-05-19 MED ORDER — CYANOCOBALAMIN 1000 MCG/ML IJ SOLN
1000.0000 ug | Freq: Once | INTRAMUSCULAR | Status: AC
  Administered 2016-05-19: 1000 ug via INTRAMUSCULAR
  Filled 2016-05-19: qty 1

## 2016-06-16 ENCOUNTER — Inpatient Hospital Stay: Payer: BLUE CROSS/BLUE SHIELD

## 2016-06-16 DIAGNOSIS — E538 Deficiency of other specified B group vitamins: Secondary | ICD-10-CM | POA: Diagnosis not present

## 2016-06-16 MED ORDER — CYANOCOBALAMIN 1000 MCG/ML IJ SOLN
1000.0000 ug | Freq: Once | INTRAMUSCULAR | Status: AC
  Administered 2016-06-16: 1000 ug via INTRAMUSCULAR
  Filled 2016-06-16: qty 1

## 2016-06-28 IMAGING — CT CT ANGIO CHEST
2 of 6 series · 18 of 36 positions shown · IV contrast (isovue)
Comparison: Chest x-ray earlier today.

CLINICAL DATA: Nonproductive cough. Increasing shortness of breast.
Tachycardia.

EXAM:
CT ANGIOGRAPHY CHEST WITH CONTRAST
TECHNIQUE: Multidetector CT imaging of the chest was performed using the
standard protocol during bolus administration of intravenous
contrast. Multiplanar CT image reconstructions and MIPs were
obtained to evaluate the vascular anatomy.
CONTRAST:  80 cc Isovue 370 IV

[Series 5: pe 1.0 thins · axial · 0.71mm/px · z∈[-409,-185]mm · 17 of 253 slices shown]
[im 15/253  lung]
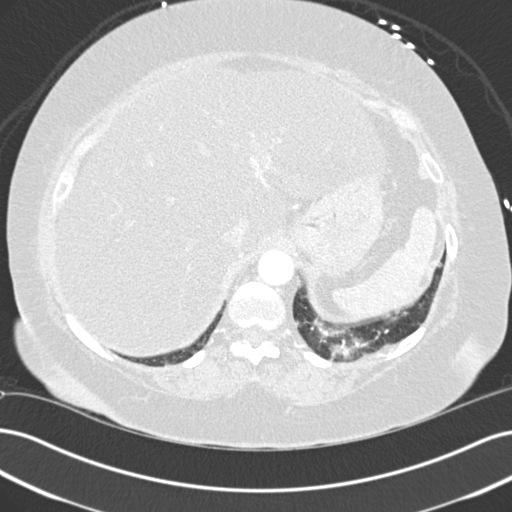
[im 29/253  mediastinal]
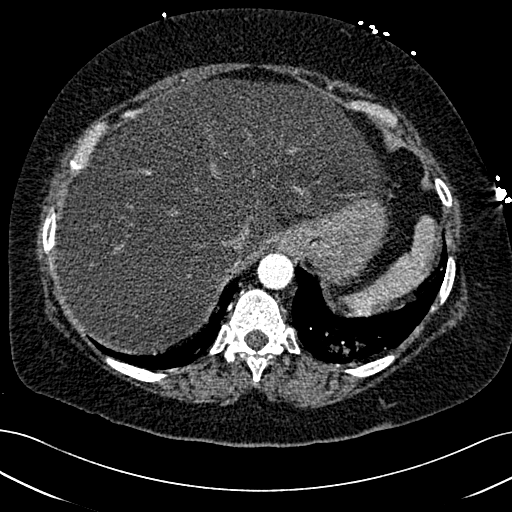
[im 43/253  lung]
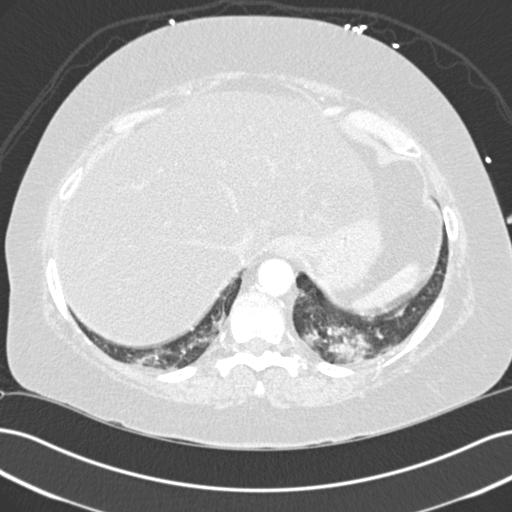
[im 57/253  mediastinal]
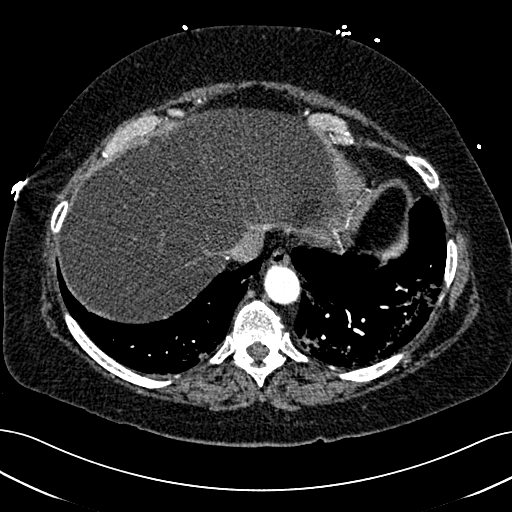
[im 71/253  lung]
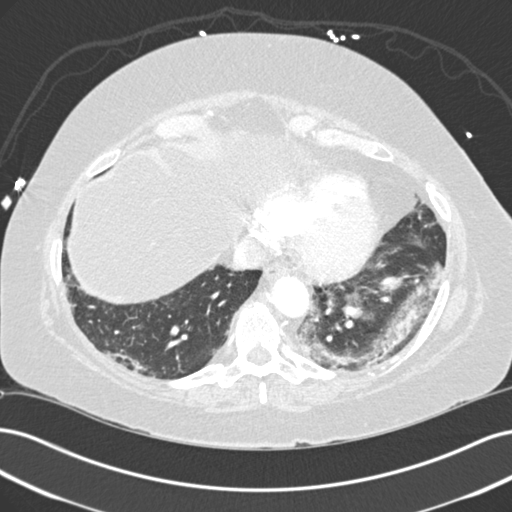
[im 85/253  mediastinal]
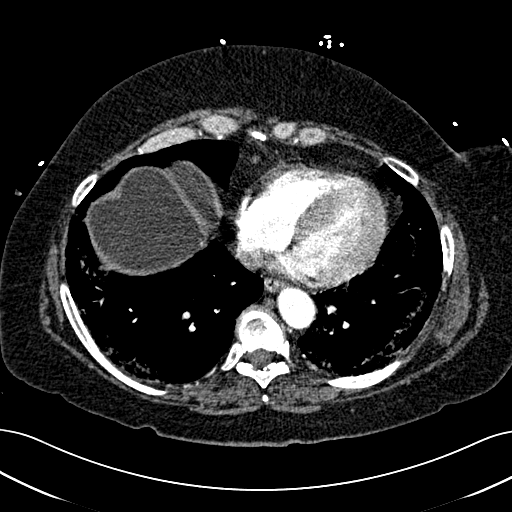
[im 99/253  lung]
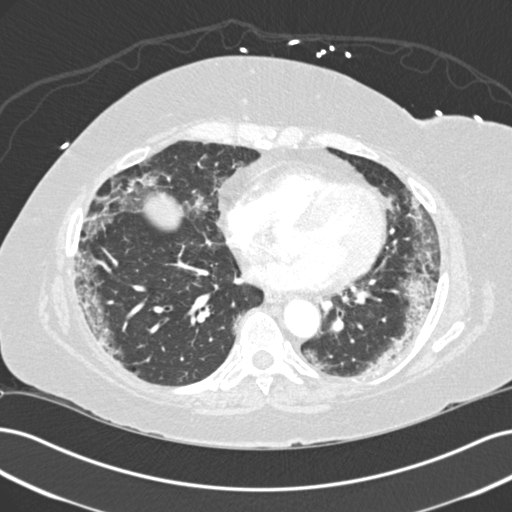
[im 113/253  mediastinal]
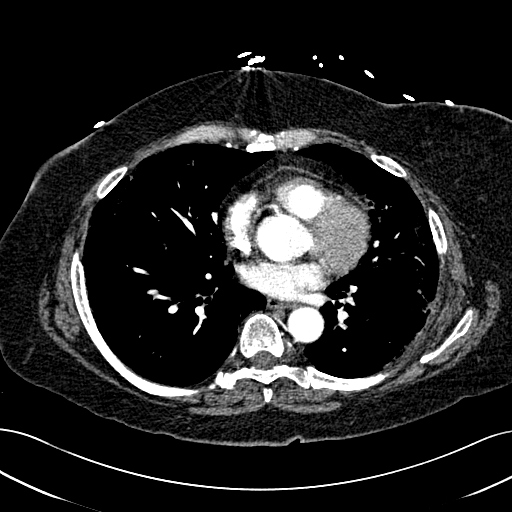
[im 127/253  lung]
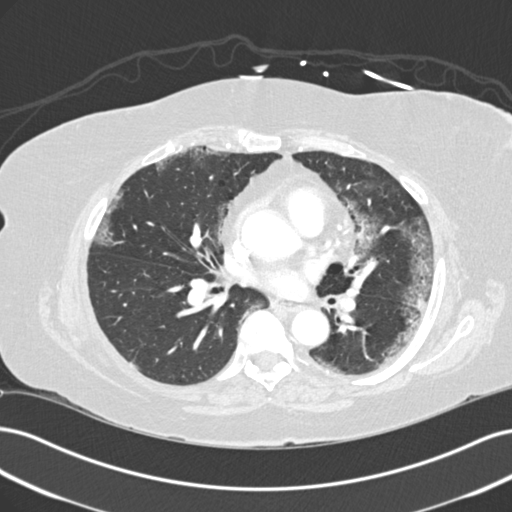
[im 141/253  mediastinal]
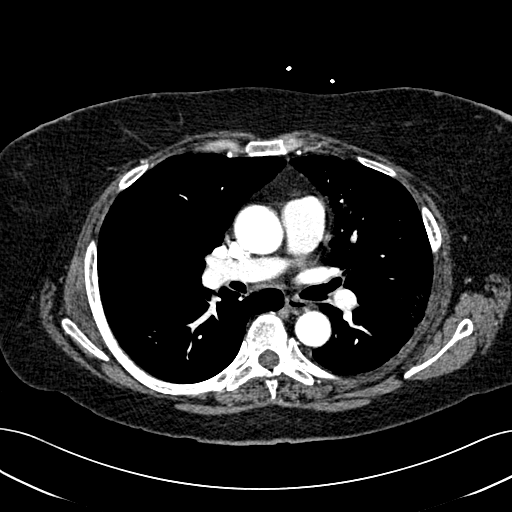
[im 155/253  lung]
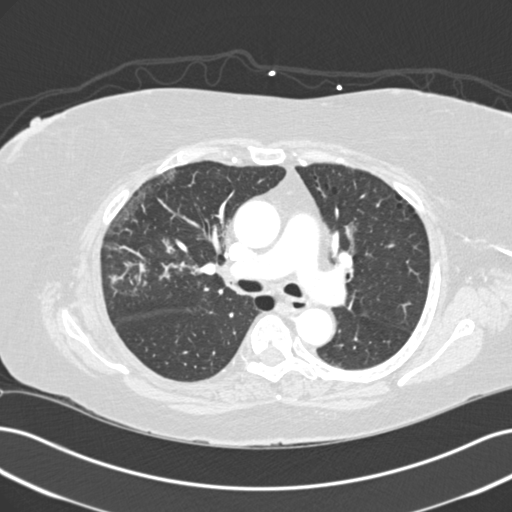
[im 169/253  mediastinal]
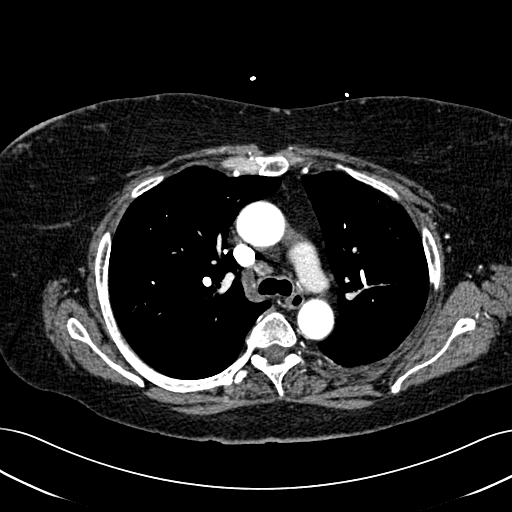
[im 183/253  lung]
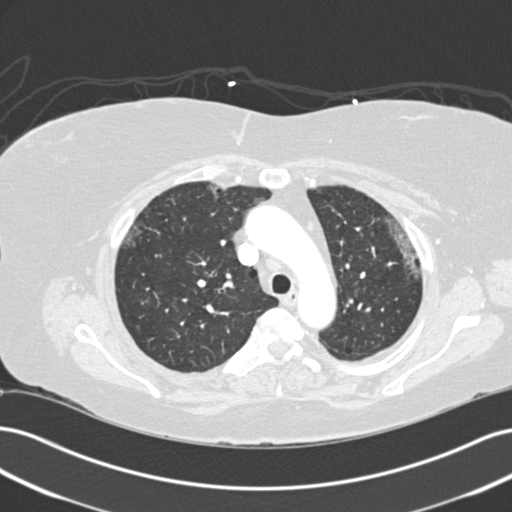
[im 197/253  mediastinal]
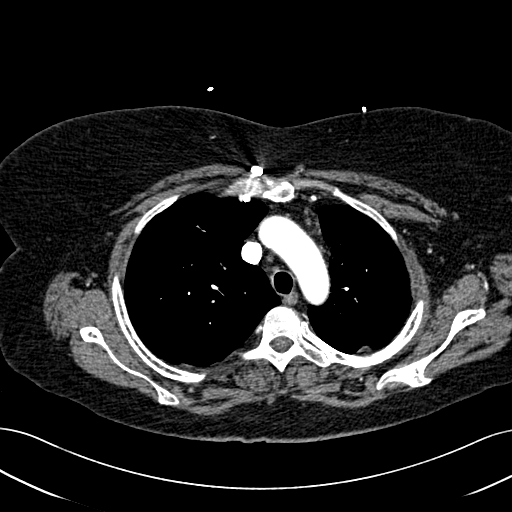
[im 211/253  lung]
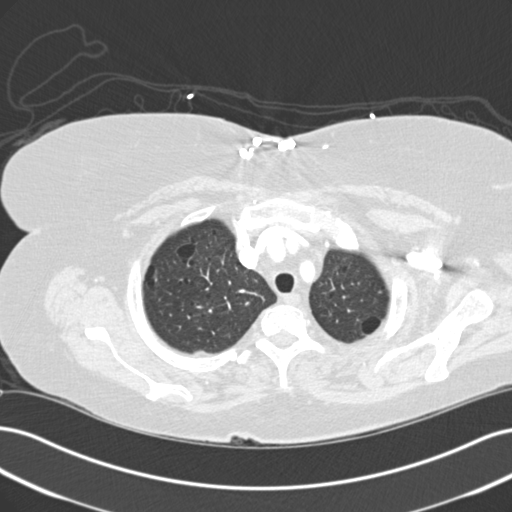
[im 225/253  mediastinal]
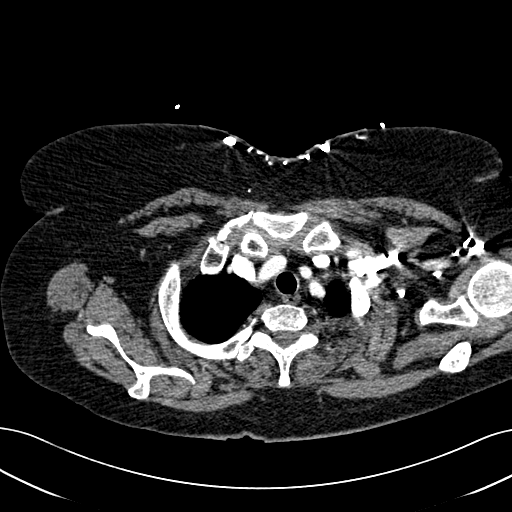
[im 239/253  lung]
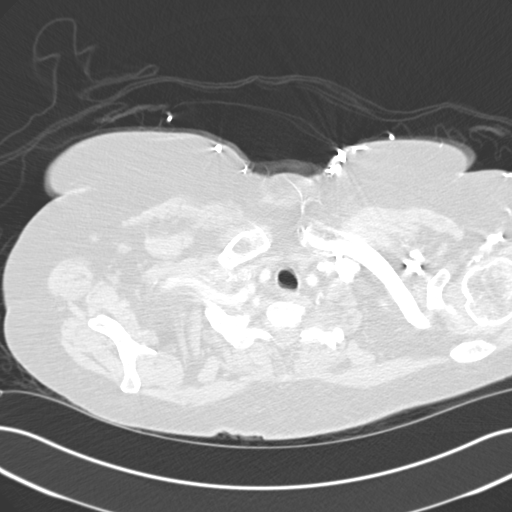

[Series 7: cor pe 2.0 mpr · coronal · 0.55mm/px · 1 of 128 slices shown]
[im 64/128  mediastinal]
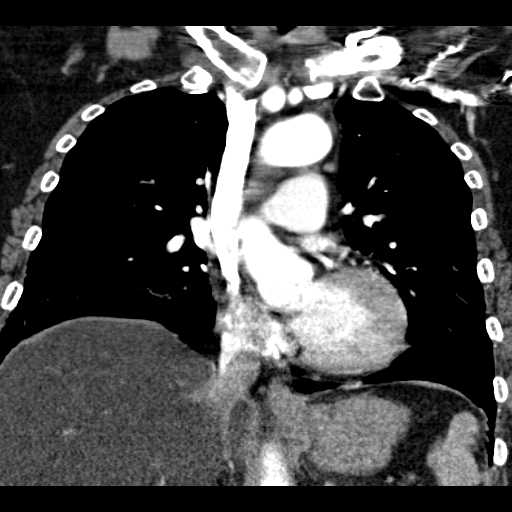

[18 of 36 positions shown; findings below may reference images not displayed]

FINDINGS: No filling defects in the pulmonary arteries to suggest pulmonary
emboli. Heart is normal size. Aorta is normal caliber. No
mediastinal, hilar, or axillary adenopathy. Chest wall soft tissues
are unremarkable.

Mild COPD changes. Scattered peripheral ground-glass opacities
within the lungs, most pronounced in the mid and lower lung zones.
There is immediate subpleural sparing. Associated interstitial
interlobular septal thickening. No pleural effusions.

Imaging into the upper abdomen shows severe diffuse fatty
infiltration of the liver.

No acute bony abnormality or focal bone lesion.

Review of the MIP images confirms the above findings.
IMPRESSION: No evidence of pulmonary embolus.

Peripheral ground-glass opacities and interstitial thickening, most
pronounced in the mid and lower lung zones. This could represent
infection or inflammatory process such as cryptogenic organizing
pneumonia. This could be followed with high-resolution lucent chest
CT in 6-12 months to assess for change.

Severe fatty infiltration of the liver.

## 2016-06-28 IMAGING — CR DG CHEST 1V PORT
1 series · 1 of 1 positions shown · non-contrast
Comparison: 03/15/2009

CLINICAL DATA: Cough and shortness of breath.

EXAM:
PORTABLE CHEST - 1 VIEW

[ap]
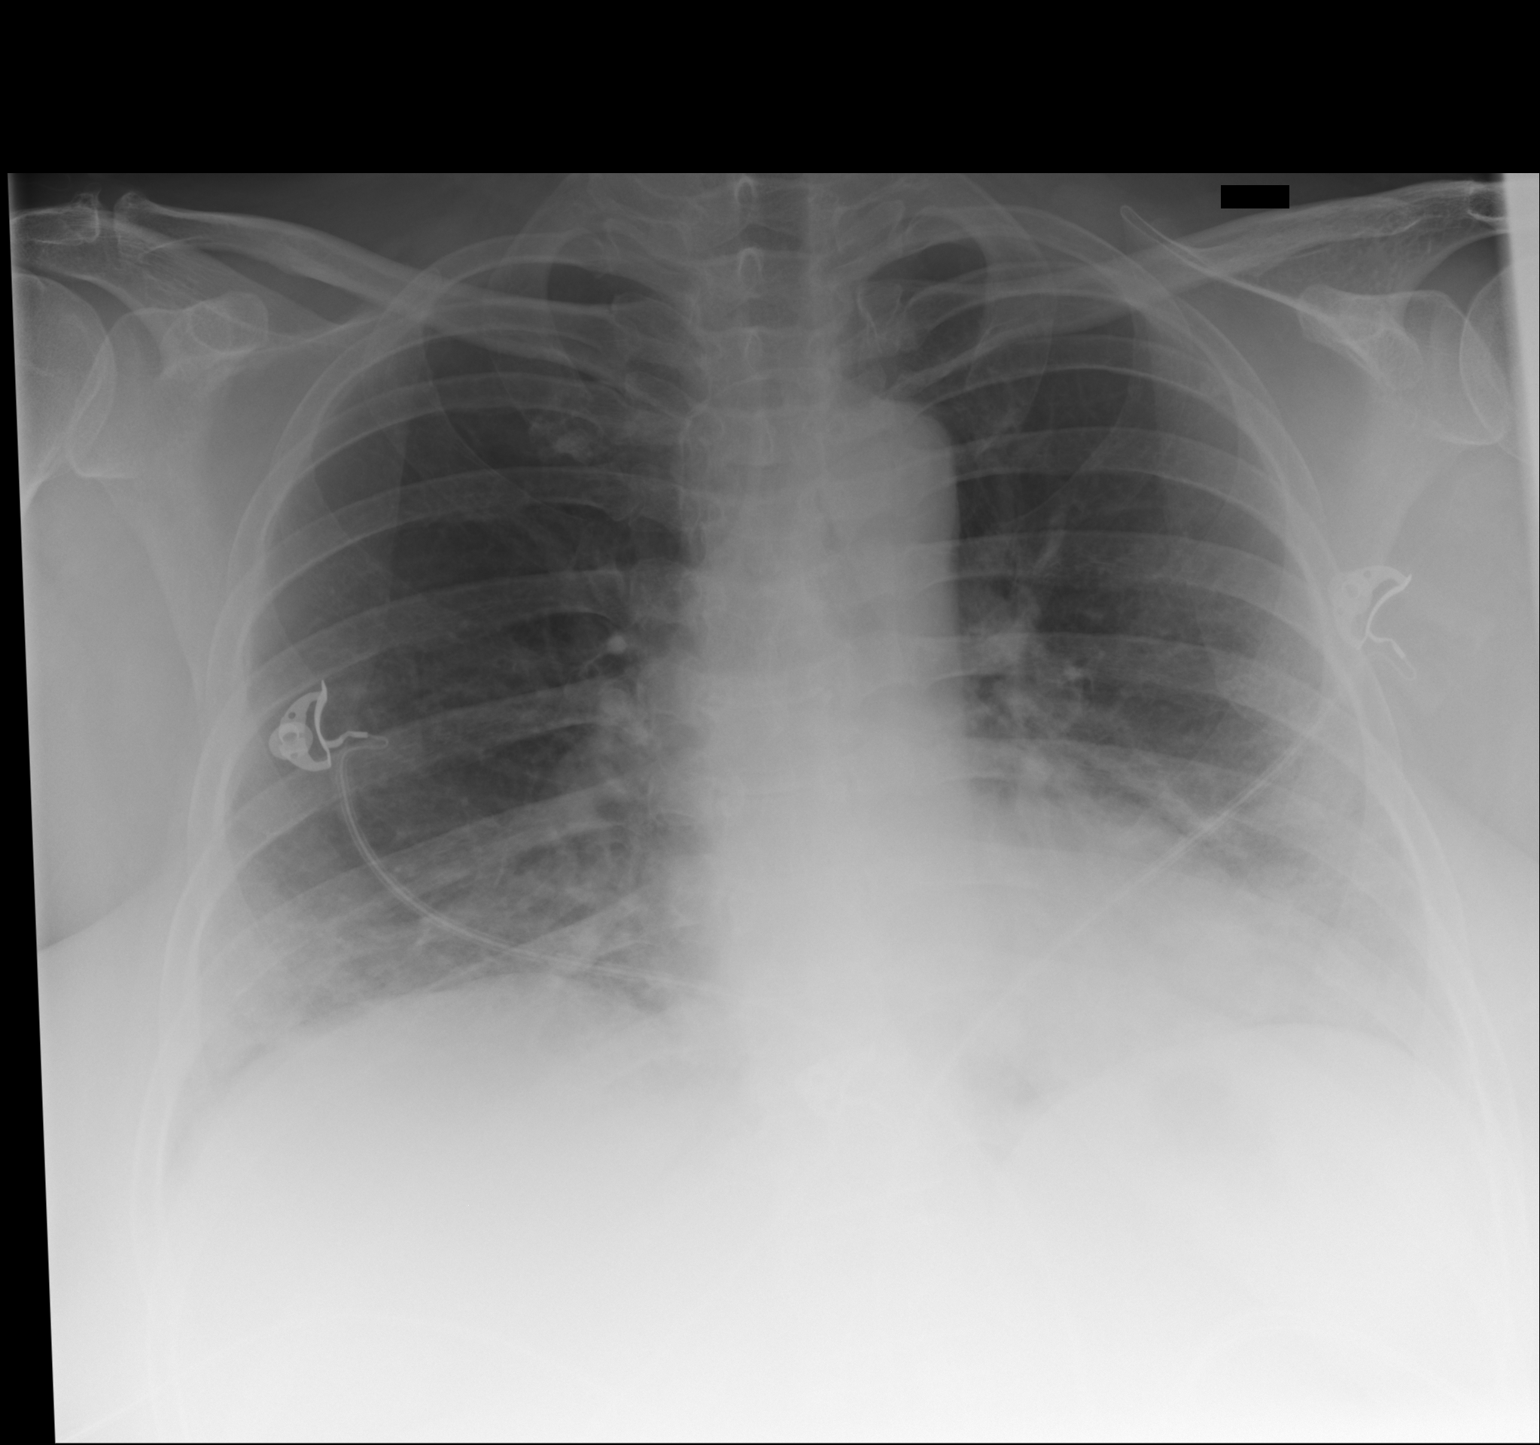

[1 of 1 positions shown; findings below may reference images not displayed]

FINDINGS: Patchy bibasilar airspace disease present, left greater than right.
This appears to be more than just atelectasis and there is concern
that there may be bilateral acute infiltrates. No overt edema or
pleural fluid is identified. The heart size is normal. No
pneumothorax.
IMPRESSION: Bibasilar airspace disease, left greater than right.

## 2016-07-14 ENCOUNTER — Inpatient Hospital Stay: Payer: BLUE CROSS/BLUE SHIELD | Attending: Oncology

## 2016-07-14 ENCOUNTER — Inpatient Hospital Stay: Payer: BLUE CROSS/BLUE SHIELD

## 2016-07-14 DIAGNOSIS — Z79899 Other long term (current) drug therapy: Secondary | ICD-10-CM | POA: Diagnosis not present

## 2016-07-14 DIAGNOSIS — E538 Deficiency of other specified B group vitamins: Secondary | ICD-10-CM | POA: Diagnosis not present

## 2016-07-14 MED ORDER — CYANOCOBALAMIN 1000 MCG/ML IJ SOLN
1000.0000 ug | Freq: Once | INTRAMUSCULAR | Status: AC
  Administered 2016-07-14: 1000 ug via INTRAMUSCULAR
  Filled 2016-07-14: qty 1

## 2016-08-11 ENCOUNTER — Inpatient Hospital Stay: Payer: BLUE CROSS/BLUE SHIELD | Attending: Oncology

## 2016-08-11 DIAGNOSIS — E538 Deficiency of other specified B group vitamins: Secondary | ICD-10-CM | POA: Diagnosis present

## 2016-08-11 DIAGNOSIS — Z79899 Other long term (current) drug therapy: Secondary | ICD-10-CM | POA: Insufficient documentation

## 2016-08-11 MED ORDER — CYANOCOBALAMIN 1000 MCG/ML IJ SOLN
1000.0000 ug | Freq: Once | INTRAMUSCULAR | Status: AC
  Administered 2016-08-11: 1000 ug via INTRAMUSCULAR
  Filled 2016-08-11: qty 1

## 2016-09-08 ENCOUNTER — Inpatient Hospital Stay: Payer: BLUE CROSS/BLUE SHIELD | Attending: Oncology

## 2016-09-08 DIAGNOSIS — Z79899 Other long term (current) drug therapy: Secondary | ICD-10-CM | POA: Diagnosis not present

## 2016-09-08 DIAGNOSIS — E538 Deficiency of other specified B group vitamins: Secondary | ICD-10-CM | POA: Diagnosis present

## 2016-09-08 MED ORDER — CYANOCOBALAMIN 1000 MCG/ML IJ SOLN
1000.0000 ug | Freq: Once | INTRAMUSCULAR | Status: AC
  Administered 2016-09-08: 1000 ug via INTRAMUSCULAR
  Filled 2016-09-08: qty 1

## 2016-10-06 ENCOUNTER — Inpatient Hospital Stay: Payer: BLUE CROSS/BLUE SHIELD

## 2016-10-06 ENCOUNTER — Inpatient Hospital Stay: Payer: BLUE CROSS/BLUE SHIELD | Attending: Oncology

## 2016-10-06 DIAGNOSIS — E538 Deficiency of other specified B group vitamins: Secondary | ICD-10-CM | POA: Diagnosis present

## 2016-10-06 DIAGNOSIS — Z79899 Other long term (current) drug therapy: Secondary | ICD-10-CM | POA: Insufficient documentation

## 2016-10-06 MED ORDER — CYANOCOBALAMIN 1000 MCG/ML IJ SOLN
1000.0000 ug | Freq: Once | INTRAMUSCULAR | Status: AC
  Administered 2016-10-06: 1000 ug via INTRAMUSCULAR
  Filled 2016-10-06: qty 1

## 2016-11-10 ENCOUNTER — Inpatient Hospital Stay: Payer: BLUE CROSS/BLUE SHIELD | Attending: Oncology

## 2016-11-10 DIAGNOSIS — E538 Deficiency of other specified B group vitamins: Secondary | ICD-10-CM | POA: Insufficient documentation

## 2016-11-10 DIAGNOSIS — Z79899 Other long term (current) drug therapy: Secondary | ICD-10-CM | POA: Diagnosis not present

## 2016-11-10 MED ORDER — CYANOCOBALAMIN 1000 MCG/ML IJ SOLN
1000.0000 ug | Freq: Once | INTRAMUSCULAR | Status: AC
  Administered 2016-11-10: 1000 ug via INTRAMUSCULAR
  Filled 2016-11-10: qty 1

## 2016-12-08 ENCOUNTER — Inpatient Hospital Stay: Payer: BLUE CROSS/BLUE SHIELD | Attending: Oncology

## 2016-12-08 DIAGNOSIS — E538 Deficiency of other specified B group vitamins: Secondary | ICD-10-CM | POA: Insufficient documentation

## 2016-12-08 DIAGNOSIS — Z79899 Other long term (current) drug therapy: Secondary | ICD-10-CM | POA: Diagnosis not present

## 2016-12-08 MED ORDER — CYANOCOBALAMIN 1000 MCG/ML IJ SOLN
1000.0000 ug | Freq: Once | INTRAMUSCULAR | Status: AC
  Administered 2016-12-08: 1000 ug via INTRAMUSCULAR
  Filled 2016-12-08: qty 1

## 2017-01-05 ENCOUNTER — Ambulatory Visit: Payer: BLUE CROSS/BLUE SHIELD

## 2017-01-05 ENCOUNTER — Inpatient Hospital Stay: Payer: BLUE CROSS/BLUE SHIELD | Attending: Oncology

## 2017-01-05 DIAGNOSIS — Z79899 Other long term (current) drug therapy: Secondary | ICD-10-CM | POA: Insufficient documentation

## 2017-01-05 DIAGNOSIS — E538 Deficiency of other specified B group vitamins: Secondary | ICD-10-CM | POA: Insufficient documentation

## 2017-01-05 MED ORDER — CYANOCOBALAMIN 1000 MCG/ML IJ SOLN
1000.0000 ug | Freq: Once | INTRAMUSCULAR | Status: AC
  Administered 2017-01-05: 1000 ug via INTRAMUSCULAR
  Filled 2017-01-05: qty 1

## 2017-02-02 ENCOUNTER — Ambulatory Visit: Payer: BLUE CROSS/BLUE SHIELD

## 2017-02-02 ENCOUNTER — Inpatient Hospital Stay: Payer: BLUE CROSS/BLUE SHIELD | Attending: Oncology

## 2017-02-02 DIAGNOSIS — Z79899 Other long term (current) drug therapy: Secondary | ICD-10-CM | POA: Insufficient documentation

## 2017-02-02 DIAGNOSIS — E538 Deficiency of other specified B group vitamins: Secondary | ICD-10-CM | POA: Insufficient documentation

## 2017-02-02 MED ORDER — CYANOCOBALAMIN 1000 MCG/ML IJ SOLN
1000.0000 ug | Freq: Once | INTRAMUSCULAR | Status: AC
  Administered 2017-02-02: 1000 ug via INTRAMUSCULAR
  Filled 2017-02-02: qty 1

## 2017-03-02 ENCOUNTER — Ambulatory Visit: Payer: BLUE CROSS/BLUE SHIELD

## 2017-03-02 ENCOUNTER — Inpatient Hospital Stay: Payer: BLUE CROSS/BLUE SHIELD | Attending: Oncology

## 2017-03-02 DIAGNOSIS — E538 Deficiency of other specified B group vitamins: Secondary | ICD-10-CM | POA: Insufficient documentation

## 2017-03-02 DIAGNOSIS — Z79899 Other long term (current) drug therapy: Secondary | ICD-10-CM | POA: Diagnosis not present

## 2017-03-02 MED ORDER — CYANOCOBALAMIN 1000 MCG/ML IJ SOLN
1000.0000 ug | Freq: Once | INTRAMUSCULAR | Status: AC
  Administered 2017-03-02: 1000 ug via INTRAMUSCULAR

## 2017-03-30 ENCOUNTER — Inpatient Hospital Stay: Payer: BLUE CROSS/BLUE SHIELD | Attending: Oncology

## 2017-03-30 ENCOUNTER — Ambulatory Visit: Payer: BLUE CROSS/BLUE SHIELD

## 2017-03-30 DIAGNOSIS — Z79899 Other long term (current) drug therapy: Secondary | ICD-10-CM | POA: Diagnosis not present

## 2017-03-30 DIAGNOSIS — E538 Deficiency of other specified B group vitamins: Secondary | ICD-10-CM | POA: Diagnosis not present

## 2017-03-30 MED ORDER — CYANOCOBALAMIN 1000 MCG/ML IJ SOLN
1000.0000 ug | Freq: Once | INTRAMUSCULAR | Status: AC
  Administered 2017-03-30: 1000 ug via INTRAMUSCULAR

## 2017-04-27 ENCOUNTER — Ambulatory Visit: Payer: BLUE CROSS/BLUE SHIELD | Admitting: Oncology

## 2017-04-27 ENCOUNTER — Ambulatory Visit: Payer: BLUE CROSS/BLUE SHIELD

## 2017-04-27 ENCOUNTER — Inpatient Hospital Stay: Payer: BLUE CROSS/BLUE SHIELD

## 2017-04-27 ENCOUNTER — Inpatient Hospital Stay: Payer: BLUE CROSS/BLUE SHIELD | Attending: Oncology | Admitting: Oncology

## 2017-04-27 ENCOUNTER — Other Ambulatory Visit: Payer: Self-pay

## 2017-04-27 VITALS — BP 146/105 | HR 69 | Temp 97.3°F | Resp 20 | Wt 182.8 lb

## 2017-04-27 DIAGNOSIS — Z8701 Personal history of pneumonia (recurrent): Secondary | ICD-10-CM | POA: Insufficient documentation

## 2017-04-27 DIAGNOSIS — Z8719 Personal history of other diseases of the digestive system: Secondary | ICD-10-CM | POA: Insufficient documentation

## 2017-04-27 DIAGNOSIS — Z7982 Long term (current) use of aspirin: Secondary | ICD-10-CM

## 2017-04-27 DIAGNOSIS — R609 Edema, unspecified: Secondary | ICD-10-CM | POA: Diagnosis not present

## 2017-04-27 DIAGNOSIS — Z87891 Personal history of nicotine dependence: Secondary | ICD-10-CM | POA: Insufficient documentation

## 2017-04-27 DIAGNOSIS — Z79899 Other long term (current) drug therapy: Secondary | ICD-10-CM

## 2017-04-27 DIAGNOSIS — D649 Anemia, unspecified: Secondary | ICD-10-CM | POA: Insufficient documentation

## 2017-04-27 DIAGNOSIS — Z803 Family history of malignant neoplasm of breast: Secondary | ICD-10-CM | POA: Diagnosis not present

## 2017-04-27 DIAGNOSIS — Z801 Family history of malignant neoplasm of trachea, bronchus and lung: Secondary | ICD-10-CM | POA: Insufficient documentation

## 2017-04-27 DIAGNOSIS — E538 Deficiency of other specified B group vitamins: Secondary | ICD-10-CM

## 2017-04-27 DIAGNOSIS — I10 Essential (primary) hypertension: Secondary | ICD-10-CM | POA: Insufficient documentation

## 2017-04-27 MED ORDER — CYANOCOBALAMIN 1000 MCG/ML IJ SOLN
1000.0000 ug | Freq: Once | INTRAMUSCULAR | Status: AC
  Administered 2017-04-27: 1000 ug via INTRAMUSCULAR
  Filled 2017-04-27: qty 1

## 2017-04-27 NOTE — Progress Notes (Signed)
[place]  Telephone:([phone]) [phone] Fax:(336) [phone]  ID: Brenda [name] OB: 06/05/1960  MR#: [id number]  GYK#:[id number]  Patient Care Team: Patient, No Pcp Per as PCP - General (General Practice)  CHIEF COMPLAINT: B-12 deficiency anemia.  INTERVAL HISTORY: Patient returns to clinic today for repeat laboratory work and continuation of B-12 injections. She currently feels well and is asymptomatic. She denies any weakness or fatigue. She has good appetite and denies weight loss. She has no neurologic complaints. She denies any recent fevers or illnesses. She has no chest pain or shortness of breath. She denies any nausea, vomiting, constipation, or diarrhea. She has no urinary complaints. Patient feels at her baseline and offers no specific complaints today.  REVIEW OF SYSTEMS:   Review of Systems  Constitutional: Negative.  Negative for fever, malaise/fatigue and weight loss.  Respiratory: Negative.  Negative for cough and shortness of breath.   Cardiovascular: Negative.  Negative for chest pain and leg swelling.  Gastrointestinal: Negative.  Negative for abdominal pain, blood in stool and melena.  Genitourinary: Negative.   Musculoskeletal: Negative.   Skin: Negative.  Negative for rash.  Neurological: Negative.  Negative for sensory change and weakness.  Psychiatric/Behavioral: Negative.  The patient is not nervous/anxious.     As per HPI. Otherwise, a complete review of systems is negative.  PAST MEDICAL HISTORY: Past Medical History:  Diagnosis Date  . Anemia   . Atypical pneumonia    a. 12/2013  . B12 deficiency 10/07/2014  . Clostridium difficile colitis    a. 12/2013.  Marland Kitchen Hypertension   . Lower extremity edema     PAST SURGICAL HISTORY: Past Surgical History:  Procedure Laterality Date  . KNEE SURGERY Left 2014  . NECK SURGERY    . SHOULDER SURGERY Left   . THROAT SURGERY    . TONSILLECTOMY      FAMILY HISTORY: Family History  Problem  Relation Age of Onset  . Multiple myeloma Mother   . Breast cancer Maternal Aunt   . Lung cancer Maternal Grandmother   . Parkinson's disease Father     ADVANCED DIRECTIVES (Y/N):  N  HEALTH MAINTENANCE: Social History   Tobacco Use  . Smoking status: Former Smoker    Packs/day: 0.50    Years: 8.00    Pack years: 4.00    Types: Cigarettes    Last attempt to quit: 01/31/2012    Years since quitting: 5.2  . Smokeless tobacco: Never Used  Substance Use Topics  . Alcohol use: Yes    Comment: SOCIAL DRINKER 1-2 vodka a week  . Drug use: No     Colonoscopy:  PAP:  Bone density:  Lipid panel:  Allergies  Allergen Reactions  . Codeine Nausea And Vomiting  . Penicillins     DOES NOT KNOW WHAT TYPE OF REACTION  . Shellfish Allergy Diarrhea and Nausea And Vomiting  . Spiriva [Tiotropium Bromide Monohydrate] Other (See Comments)    CLOSES THROAT    Current Outpatient Medications  Medication Sig Dispense Refill  . albuterol (PROVENTIL HFA;VENTOLIN HFA) 108 (90 BASE) MCG/ACT inhaler Inhale 2 puffs into the lungs every 6 (six) hours as needed for wheezing or shortness of breath. 1 Inhaler 2  . aspirin 81 MG tablet Take 81 mg by mouth daily.    . Cholecalciferol (VITAMIN D-3) 1000 UNITS CAPS Take 1 capsule by mouth daily.    Marland Kitchen co-enzyme Q-10 30 MG capsule Take 30 mg by mouth 3 (three) times daily.    Marland Kitchen  cyanocobalamin (,VITAMIN B-12,) 1000 MCG/ML injection Inject 1,000 mcg into the muscle every 30 (thirty) days.    . diclofenac sodium (VOLTAREN) 1 % GEL APPLY 1 INCH TO LEFT KNEE 2-3 TIMES DAILY  2  . folic acid (FOLVITE) 1 MG tablet Take 1 mg by mouth daily.    . furosemide (LASIX) 20 MG tablet Take 1 tablet (20 mg total) by mouth daily. 30 tablet 0  . metoprolol (LOPRESSOR) 50 MG tablet Take 50 mg by mouth at bedtime.     . Multiple Vitamins-Minerals (MULTIVITAMIN GUMMIES ADULT PO) Take 1 Dose by mouth daily.    . niacin 250 MG tablet Take 250 mg by mouth at bedtime.    .  Omega-3 Fatty Acids (FISH OIL) 1000 MG CAPS Take by mouth.    . POTASSIUM CHLORIDE PO Take 1 tablet by mouth daily.    . Probiotic Product (PROBIOTIC & ACIDOPHILUS EX ST PO) Take 1 Dose by mouth daily.     No current facility-administered medications for this visit.     OBJECTIVE: Vitals:   04/27/17 1521  BP: (!) 146/105  Pulse: 69  Resp: 20  Temp: (!) 97.3 F (36.3 C)     Body mass index is 30.89 kg/m.    ECOG FS:0 - Asymptomatic  General: Well-developed, well-nourished, no acute distress. Eyes: Pink conjunctiva, anicteric sclera. Lungs: Clear to auscultation bilaterally. Heart: Regular rate and rhythm. No rubs, murmurs, or gallops. Abdomen: Soft, nontender, nondistended. No organomegaly noted, normoactive bowel sounds. Musculoskeletal: No edema, cyanosis, or clubbing. Neuro: Alert, answering all questions appropriately. Cranial nerves grossly intact. Skin: No rashes or petechiae noted. Psych: Normal affect.   LAB RESULTS:  Lab Results  Component Value Date   NA 141 01/19/2014   K 3.6 01/19/2014   CL 106 01/19/2014   CO2 25 01/19/2014   GLUCOSE 101 (H) 01/19/2014   BUN 3 (L) 01/19/2014   CREATININE 0.62 01/19/2014   CALCIUM 8.1 (L) 01/19/2014   GFRNONAA >60 01/19/2014   GFRAA >60 01/19/2014    Lab Results  Component Value Date   WBC 13.1 (H) 12/26/2013   NEUTROABS 9.4 (H) 12/23/2013   HGB 12.1 12/26/2013   HCT 35.5 12/26/2013   MCV 105 (H) 12/26/2013   PLT 257 12/26/2013     STUDIES: No results found.  ASSESSMENT: B-12 deficiency anemia.  PLAN:    1. B-12 deficiency anemia: All patient's laboratory work is done at [facility]. Hemoglobin and iron stores are within normal limits.  B12 level is 906. Return to clinic every 2 months for B12 injections then in 6 months for laboratory work and further evaluation.  Approximately 20 minutes was spent in discussion of which greater than 50% was consultation.   Patient expressed understanding and was in agreement  with this plan. She also understands that She can call clinic at any time with any questions, concerns, or complaints.    Timothy J Finnegan, MD   04/30/2017 2:05 PM     

## 2017-04-27 NOTE — Progress Notes (Signed)
Patient denies any concerns today.  

## 2017-05-05 ENCOUNTER — Encounter: Payer: Self-pay | Admitting: Oncology

## 2017-06-22 ENCOUNTER — Inpatient Hospital Stay: Payer: BLUE CROSS/BLUE SHIELD | Attending: Oncology

## 2017-06-22 DIAGNOSIS — E538 Deficiency of other specified B group vitamins: Secondary | ICD-10-CM | POA: Insufficient documentation

## 2017-06-22 DIAGNOSIS — Z79899 Other long term (current) drug therapy: Secondary | ICD-10-CM | POA: Insufficient documentation

## 2017-06-22 MED ORDER — CYANOCOBALAMIN 1000 MCG/ML IJ SOLN
1000.0000 ug | Freq: Once | INTRAMUSCULAR | Status: AC
  Administered 2017-06-22: 1000 ug via INTRAMUSCULAR

## 2017-06-28 ENCOUNTER — Inpatient Hospital Stay: Payer: BLUE CROSS/BLUE SHIELD

## 2017-08-14 ENCOUNTER — Other Ambulatory Visit: Payer: Self-pay

## 2017-08-14 DIAGNOSIS — Z1211 Encounter for screening for malignant neoplasm of colon: Secondary | ICD-10-CM

## 2017-08-17 ENCOUNTER — Inpatient Hospital Stay: Payer: BLUE CROSS/BLUE SHIELD | Attending: Oncology

## 2017-08-17 ENCOUNTER — Telehealth: Payer: Self-pay | Admitting: *Deleted

## 2017-08-17 DIAGNOSIS — E538 Deficiency of other specified B group vitamins: Secondary | ICD-10-CM | POA: Insufficient documentation

## 2017-08-17 DIAGNOSIS — Z79899 Other long term (current) drug therapy: Secondary | ICD-10-CM | POA: Diagnosis not present

## 2017-08-17 MED ORDER — CYANOCOBALAMIN 1000 MCG/ML IJ SOLN
1000.0000 ug | Freq: Once | INTRAMUSCULAR | Status: AC
  Administered 2017-08-17: 1000 ug via INTRAMUSCULAR
  Filled 2017-08-17: qty 1

## 2017-08-17 NOTE — Telephone Encounter (Signed)
Patient came by clinic today for her b12 injection. Patient needs a [facility] [employer] req for her labs in June. Patient requested that the lab req be mailed to her address.

## 2017-08-23 ENCOUNTER — Telehealth: Payer: Self-pay | Admitting: Gastroenterology

## 2017-08-23 ENCOUNTER — Inpatient Hospital Stay: Payer: BLUE CROSS/BLUE SHIELD

## 2017-08-23 NOTE — Telephone Encounter (Signed)
Pt left vm to speak to nuirse in regards to questions on procedure please call pt

## 2017-08-23 NOTE — Telephone Encounter (Signed)
Thanks pts call has been returned.

## 2017-08-30 ENCOUNTER — Other Ambulatory Visit: Payer: Self-pay

## 2017-08-30 ENCOUNTER — Encounter: Payer: Self-pay | Admitting: Anesthesiology

## 2017-08-30 ENCOUNTER — Ambulatory Visit
Admission: RE | Admit: 2017-08-30 | Discharge: 2017-08-30 | Disposition: A | Discharge status: Home / Self Care | Payer: BLUE CROSS/BLUE SHIELD | Source: Ambulatory Visit | Attending: Gastroenterology | Admitting: Gastroenterology

## 2017-08-30 ENCOUNTER — Encounter
Admission: RE | Disposition: A | Discharge status: Home / Self Care | Payer: Self-pay | Source: Ambulatory Visit | Attending: Gastroenterology

## 2017-08-30 ENCOUNTER — Ambulatory Visit: Payer: BLUE CROSS/BLUE SHIELD | Admitting: Certified Registered Nurse Anesthetist

## 2017-08-30 DIAGNOSIS — J449 Chronic obstructive pulmonary disease, unspecified: Secondary | ICD-10-CM | POA: Diagnosis not present

## 2017-08-30 DIAGNOSIS — Z880 Allergy status to penicillin: Secondary | ICD-10-CM | POA: Insufficient documentation

## 2017-08-30 DIAGNOSIS — Z803 Family history of malignant neoplasm of breast: Secondary | ICD-10-CM | POA: Diagnosis not present

## 2017-08-30 DIAGNOSIS — F17200 Nicotine dependence, unspecified, uncomplicated: Secondary | ICD-10-CM | POA: Insufficient documentation

## 2017-08-30 DIAGNOSIS — Z86010 Personal history of colonic polyps: Secondary | ICD-10-CM | POA: Insufficient documentation

## 2017-08-30 DIAGNOSIS — I10 Essential (primary) hypertension: Secondary | ICD-10-CM | POA: Diagnosis not present

## 2017-08-30 DIAGNOSIS — Z885 Allergy status to narcotic agent status: Secondary | ICD-10-CM | POA: Diagnosis not present

## 2017-08-30 DIAGNOSIS — Z801 Family history of malignant neoplasm of trachea, bronchus and lung: Secondary | ICD-10-CM | POA: Diagnosis not present

## 2017-08-30 DIAGNOSIS — D123 Benign neoplasm of transverse colon: Secondary | ICD-10-CM | POA: Diagnosis not present

## 2017-08-30 DIAGNOSIS — Z1211 Encounter for screening for malignant neoplasm of colon: Secondary | ICD-10-CM

## 2017-08-30 DIAGNOSIS — Z808 Family history of malignant neoplasm of other organs or systems: Secondary | ICD-10-CM | POA: Insufficient documentation

## 2017-08-30 DIAGNOSIS — D125 Benign neoplasm of sigmoid colon: Secondary | ICD-10-CM | POA: Diagnosis not present

## 2017-08-30 DIAGNOSIS — Z91013 Allergy to seafood: Secondary | ICD-10-CM | POA: Diagnosis not present

## 2017-08-30 DIAGNOSIS — D124 Benign neoplasm of descending colon: Secondary | ICD-10-CM | POA: Diagnosis not present

## 2017-08-30 DIAGNOSIS — Z79899 Other long term (current) drug therapy: Secondary | ICD-10-CM | POA: Insufficient documentation

## 2017-08-30 HISTORY — PX: COLONOSCOPY WITH PROPOFOL: SHX5780

## 2017-08-30 HISTORY — DX: Unspecified asthma, uncomplicated: J45.909

## 2017-08-30 SURGERY — COLONOSCOPY WITH PROPOFOL
Anesthesia: General

## 2017-08-30 MED ORDER — PROPOFOL 500 MG/50ML IV EMUL
INTRAVENOUS | Status: DC | PRN
  Administered 2017-08-30: 150 ug/kg/min via INTRAVENOUS

## 2017-08-30 MED ORDER — PROPOFOL 10 MG/ML IV BOLUS
INTRAVENOUS | Status: DC | PRN
  Administered 2017-08-30: 20 mg via INTRAVENOUS
  Administered 2017-08-30: 30 mg via INTRAVENOUS
  Administered 2017-08-30: 50 mg via INTRAVENOUS

## 2017-08-30 MED ORDER — MIDAZOLAM HCL 2 MG/2ML IJ SOLN
INTRAMUSCULAR | Status: DC | PRN
  Administered 2017-08-30: 2 mg via INTRAVENOUS

## 2017-08-30 MED ORDER — MIDAZOLAM HCL 2 MG/2ML IJ SOLN
INTRAMUSCULAR | Status: AC
  Filled 2017-08-30: qty 2

## 2017-08-30 MED ORDER — SODIUM CHLORIDE 0.9 % IV SOLN
INTRAVENOUS | Status: DC
  Administered 2017-08-30: 10:00:00 via INTRAVENOUS

## 2017-08-30 NOTE — Anesthesia Postprocedure Evaluation (Signed)
Anesthesia Post Note  Patient: Brenda [name]  Procedure(s) Performed: COLONOSCOPY WITH PROPOFOL (N/A )  Patient location during evaluation: Endoscopy Anesthesia Type: General Level of consciousness: awake and alert Pain management: pain level controlled Vital Signs Assessment: post-procedure vital signs reviewed and stable Respiratory status: spontaneous breathing, nonlabored ventilation, respiratory function stable and patient connected to nasal cannula oxygen Cardiovascular status: blood pressure returned to baseline and stable Postop Assessment: no apparent nausea or vomiting Anesthetic complications: no     Last Vitals:  Vitals:   08/30/17 1002 08/30/17 1111  BP: (!) 154/105 (!) 144/105  Pulse: 92   Resp: 16 (!) 23  Temp: (!) 36.3 C (!) 36.1 C  SpO2: 95% 99%    Last Pain:  Vitals:   08/30/17 1121  TempSrc:   PainSc: 0-No pain                 [name] S

## 2017-08-30 NOTE — Transfer of Care (Signed)
Immediate Anesthesia Transfer of Care Note  Patient: Brenda [name]  Procedure(s) Performed: COLONOSCOPY WITH PROPOFOL (N/A )  Patient Location: PACU  Anesthesia Type:General  Level of Consciousness: awake, alert  and oriented  Airway & Oxygen Therapy: Patient Spontanous Breathing and Patient connected to nasal cannula oxygen  Post-op Assessment: Report given to RN and Post -op Vital signs reviewed and stable  Post vital signs: Reviewed and stable  Last Vitals:  Vitals Value Taken Time  BP 144/105 08/30/2017 11:12 AM  Temp    Pulse 86 08/30/2017 11:13 AM  Resp 22 08/30/2017 11:13 AM  SpO2 100 % 08/30/2017 11:13 AM  Vitals shown include unvalidated device data.  Last Pain:  Vitals:   08/30/17 1002  TempSrc: Tympanic  PainSc: 0-No pain         Complications: No apparent anesthesia complications

## 2017-08-30 NOTE — Anesthesia Post-op Follow-up Note (Signed)
Anesthesia QCDR form completed.        

## 2017-08-30 NOTE — Op Note (Signed)
[facility] Gastroenterology Patient Name: Brenda [name] Procedure Date: 08/30/2017 10:30 AM MRN: [id number] Account #: [id number] Date of Birth: 1961-02-08 Admit Type: Outpatient Age: 57 Room: [facility] ENDO ROOM 2 Gender: Female Note Status: Finalized Procedure:            Colonoscopy Indications:          High risk colon cancer surveillance: Personal history                        of colonic polyps Providers:            Lin Landsman MD, MD Referring MD:         Shelby Mattocks. Georga Bora, MD (Referring MD) Medicines:            Monitored Anesthesia Care Complications:        No immediate complications. Estimated blood loss: None. Procedure:            Pre-Anesthesia Assessment:                       - Prior to the procedure, a History and Physical was                        performed, and patient medications and allergies were                        reviewed. The patient is competent. The risks and                        benefits of the procedure and the sedation options and                        risks were discussed with the patient. All questions                        were answered and informed consent was obtained.                        Patient identification and proposed procedure were                        verified by the physician, the nurse, the                        anesthesiologist, the anesthetist and the technician in                        the pre-procedure area in the procedure room in the                        endoscopy suite. Mental Status Examination: alert and                        oriented. Airway Examination: normal oropharyngeal                        airway and neck mobility. Respiratory Examination:                        clear to auscultation. CV Examination: normal.  Prophylactic Antibiotics: The patient does not require                        prophylactic antibiotics. Prior Anticoagulants: The     patient has taken no previous anticoagulant or                        antiplatelet agents. ASA Grade Assessment: III - A                        patient with severe systemic disease. After reviewing                        the risks and benefits, the patient was deemed in                        satisfactory condition to undergo the procedure. The                        anesthesia plan was to use monitored anesthesia care                        (MAC). Immediately prior to administration of                        medications, the patient was re-assessed for adequacy                        to receive sedatives. The heart rate, respiratory rate,                        oxygen saturations, blood pressure, adequacy of                        pulmonary ventilation, and response to care were                        monitored throughout the procedure. The physical status                        of the patient was re-assessed after the procedure.                       After obtaining informed consent, the colonoscope was                        passed under direct vision. Throughout the procedure,                        the patient's blood pressure, pulse, and oxygen                        saturations were monitored continuously. The                        Colonoscope was introduced through the anus and                        advanced to the the cecum, identified by appendiceal  orifice and ileocecal valve. The colonoscopy was                        performed without difficulty. The patient tolerated the                        procedure fairly well. The quality of the bowel                        preparation was evaluated using the BBPS [facility] Bowel                        Preparation Scale) with scores of: Right Colon = 1                        (portion of mucosa seen, but other areas not well seen                        due to staining, residual stool and/or opaque liquid),                         Transverse Colon = 2 (minor amount of residual                        staining, small fragments of stool and/or opaque                        liquid, but mucosa seen well) and Left Colon = 3                        (entire mucosa seen well with no residual staining,                        small fragments of stool or opaque liquid). The total                        BBPS score equals 6. The quality of the bowel                        preparation was fair. Findings:      The perianal and digital rectal examinations were normal. Pertinent       negatives include normal sphincter tone and no palpable rectal lesions.      A moderate amount of stool was found in the transverse colon, in the       ascending colon and in the cecum, precluding visualization. Lavage of       the area was performed using a moderate amount of sterile water,       resulting in clearance with fair visualization.      A 5 mm polyp was found in the transverse colon. The polyp was sessile.       The polyp was removed with a cold snare. Resection and retrieval were       complete.      A 9 mm polyp was found in the transverse colon. The polyp was sessile.       The polyp was removed with a hot snare. Resection and retrieval were       complete.      Two sessile  polyps were found in the sigmoid colon and descending colon.       The polyps were diminutive in size. These polyps were removed with a       jumbo cold forceps. Resection and retrieval were complete.      The retroflexed view of the distal rectum and anal verge was normal and       showed no anal or rectal abnormalities.      Multiple diverticula were found in the sigmoid colon and ascending       colon. There was no evidence of diverticular bleeding. Impression:           - Preparation of the colon was fair.                       - Stool in the transverse colon, in the ascending colon                        and in the cecum.                       -  One 5 mm polyp in the transverse colon, removed with                        a cold snare. Resected and retrieved.                       - One 9 mm polyp in the transverse colon, removed with                        a hot snare. Resected and retrieved.                       - Two diminutive polyps in the sigmoid colon and in the                        descending colon, removed with a jumbo cold forceps.                        Resected and retrieved.                       - The distal rectum and anal verge are normal on                        retroflexion view. Recommendation:       - Discharge patient to home.                       - Resume previous diet today.                       - Continue present medications.                       - Await pathology results.                       - Repeat colonoscopy in 6 months because the bowel                        preparation was suboptimal in right  colon. Procedure Code(s):    --- Professional ---                       [phone], Colonoscopy, flexible; with removal of tumor(s),                        polyp(s), or other lesion(s) by snare technique                       45380, 9, Colonoscopy, flexible; with biopsy, single                        or multiple Diagnosis Code(s):    --- Professional ---                       Z86.010, Personal history of colonic polyps                       D12.3, Benign neoplasm of transverse colon (hepatic                        flexure or splenic flexure)                       D12.5, Benign neoplasm of sigmoid colon                       D12.4, Benign neoplasm of descending colon CPT copyright 2017 American Medical Association. All rights reserved. The codes documented in this report are preliminary and upon coder review may  be revised to meet current compliance requirements. Dr. Ulyess Mort Lin Landsman MD, MD 08/30/2017 11:11:31 AM This report has been signed electronically. Number of Addenda: 0 Note  Initiated On: 08/30/2017 10:30 AM Scope Withdrawal Time: 0 hours 19 minutes 26 seconds  Total Procedure Duration: 0 hours 21 minutes 56 seconds       [facility]

## 2017-08-30 NOTE — Anesthesia Preprocedure Evaluation (Signed)
Anesthesia Evaluation  Patient identified by MRN, date of birth, ID band Patient awake    Reviewed: Allergy & Precautions, NPO status , Patient's Chart, lab work & pertinent test results, reviewed documented beta blocker date and time   Airway Mallampati: III  TM Distance: >3 FB     Dental  (+) Chipped   Pulmonary asthma , COPD, Current Smoker,           Cardiovascular hypertension, Pt. on medications and Pt. on home beta blockers +CHF       Neuro/Psych    GI/Hepatic   Endo/Other    Renal/GU      Musculoskeletal   Abdominal   Peds  Hematology  (+) anemia ,   Anesthesia Other Findings   Reproductive/Obstetrics                             Anesthesia Physical Anesthesia Plan  ASA: III  Anesthesia Plan: General   Post-op Pain Management:    Induction: Intravenous  PONV Risk Score and Plan:   Airway Management Planned:   Additional Equipment:   Intra-op Plan:   Post-operative Plan:   Informed Consent: I have reviewed the patients History and Physical, chart, labs and discussed the procedure including the risks, benefits and alternatives for the proposed anesthesia with the patient or authorized representative who has indicated his/her understanding and acceptance.     Plan Discussed with: CRNA  Anesthesia Plan Comments:         Anesthesia Quick Evaluation

## 2017-08-30 NOTE — H&P (Signed)
Cephas Darby, MD [address]  [place]  [place], [place] [address]  Main: [phone]  Fax: [phone] Pager: [phone]  Primary Care Physician:  Elgie Collard, MD Primary Gastroenterologist:  Dr. Cephas Darby  Pre-Procedure History & Physical: HPI:  Brenda [name] is a 57 y.o. female is here for an colonoscopy.   Past Medical History:  Diagnosis Date  . Anemia   . Asthma   . Atypical pneumonia    a. 12/2013  . B12 deficiency 10/07/2014  . Clostridium difficile colitis    a. 12/2013.  Marland Kitchen Hypertension   . Lower extremity edema     Past Surgical History:  Procedure Laterality Date  . KNEE SURGERY Left 2014  . NECK SURGERY    . SHOULDER SURGERY Left   . THROAT SURGERY    . TONSILLECTOMY      Prior to Admission medications   Medication Sig Start Date End Date Taking? Authorizing Provider  albuterol (PROVENTIL HFA;VENTOLIN HFA) 108 (90 BASE) MCG/ACT inhaler Inhale 2 puffs into the lungs every 6 (six) hours as needed for wheezing or shortness of breath. 01/30/14   Juanito Doom, MD  aspirin 81 MG tablet Take 81 mg by mouth daily.    [provider]  Cholecalciferol (VITAMIN D-3) 1000 UNITS CAPS Take 1 capsule by mouth daily.    [provider]  co-enzyme Q-10 30 MG capsule Take 30 mg by mouth 3 (three) times daily.    [provider]  cyanocobalamin (,VITAMIN B-12,) 1000 MCG/ML injection Inject 1,000 mcg into the muscle every 30 (thirty) days.    [provider]  diclofenac sodium (VOLTAREN) 1 % GEL APPLY 1 INCH TO LEFT KNEE 2-3 TIMES DAILY 01/21/17   [provider]  folic acid (FOLVITE) 1 MG tablet Take 1 mg by mouth daily.    [provider]  furosemide (LASIX) 20 MG tablet Take 1 tablet (20 mg total) by mouth daily. 01/11/14   Theora Gianotti, NP  metoprolol (LOPRESSOR) 50 MG tablet Take 50 mg by mouth at bedtime.     [provider]  Multiple Vitamins-Minerals (MULTIVITAMIN  GUMMIES ADULT PO) Take 1 Dose by mouth daily.    [provider]  niacin 250 MG tablet Take 250 mg by mouth at bedtime.    [provider]  Omega-3 Fatty Acids (FISH OIL) 1000 MG CAPS Take by mouth.    [provider]  POTASSIUM CHLORIDE PO Take 1 tablet by mouth daily.    [provider]  Probiotic Product (PROBIOTIC & ACIDOPHILUS EX ST PO) Take 1 Dose by mouth daily.    [provider]    Allergies as of 08/15/2017 - Review Complete 04/02/2015  Allergen Reaction Noted  . Codeine Nausea And Vomiting 02/20/2013  . Penicillins  02/20/2013  . Shellfish allergy Diarrhea and Nausea And Vomiting 02/20/2013  . Spiriva [tiotropium bromide monohydrate] Other (See Comments) 02/20/2013    Family History  Problem Relation Age of Onset  . Multiple myeloma Mother   . Breast cancer Maternal Aunt   . Lung cancer Maternal Grandmother   . Parkinson's disease Father     Social History   Socioeconomic History  . Marital status: Married    Spouse name: Not on file  . Number of children: Not on file  . Years of education: Not on file  . Highest education level: Not on file  Occupational History  . Not on file  Social Needs  . Financial  resource strain: Not on file  . Food insecurity:    Worry: Not on file    Inability: Not on file  . Transportation needs:    Medical: Not on file    Non-medical: Not on file  Tobacco Use  . Smoking status: Current Some Day Smoker    Packs/day: 0.50    Years: 8.00    Pack years: 4.00    Types: Cigarettes    Last attempt to quit: 01/31/2012    Years since quitting: 5.5  . Smokeless tobacco: Never Used  Substance and Sexual Activity  . Alcohol use: Yes    Comment: SOCIAL DRINKER 1-2 vodka a week  . Drug use: No  . Sexual activity: Not on file  Lifestyle  . Physical activity:    Days per week: Not on file    Minutes per session: Not on file  . Stress: Not on file  Relationships  . Social connections:     Talks on phone: Not on file    Gets together: Not on file    Attends religious service: Not on file    Active member of club or organization: Not on file    Attends meetings of clubs or organizations: Not on file    Relationship status: Not on file  . Intimate partner violence:    Fear of current or ex partner: Not on file    Emotionally abused: Not on file    Physically abused: Not on file    Forced sexual activity: Not on file  Other Topics Concern  . Not on file  Social History Narrative  . Not on file    Review of Systems: See HPI, otherwise negative ROS  Physical Exam: BP (!) 154/105   Pulse 92   Temp (!) 97.4 F (36.3 C) (Tympanic)   Resp 16   Ht '5\' 4"'$  (1.626 m)   Wt 173 lb (78.5 kg)   SpO2 95%   BMI 29.70 kg/m  General:   Alert,  pleasant and cooperative in NAD Head:  Normocephalic and atraumatic. Neck:  Supple; no masses or thyromegaly. Lungs:  Clear throughout to auscultation.    Heart:  Regular rate and rhythm. Abdomen:  Soft, nontender and nondistended. Normal bowel sounds, without guarding, and without rebound.   Neurologic:  Alert and  oriented x4;  grossly normal neurologically.  Impression/Plan: CALLY NYGARD is here for an colonoscopy to be performed for colon cancer screening  Risks, benefits, limitations, and alternatives regarding  colonoscopy have been reviewed with the patient.  Questions have been answered.  All parties agreeable.   Sherri Sear, MD  08/30/2017, 10:27 AM

## 2017-08-30 NOTE — Anesthesia Procedure Notes (Signed)
Performed by: Miral Hoopes, CRNA Pre-anesthesia Checklist: Emergency Drugs available, Patient identified, Suction available, Patient being monitored and Timeout performed Patient Re-evaluated:Patient Re-evaluated prior to induction Oxygen Delivery Method: Nasal cannula Induction Type: IV induction       

## 2017-08-30 NOTE — Progress Notes (Signed)
Patient rescheduled for colonoscopy due to poor prep.

## 2017-08-31 ENCOUNTER — Ambulatory Visit
Admission: RE | Admit: 2017-08-31 | Discharge: 2017-08-31 | Disposition: A | Discharge status: Home / Self Care | Payer: BLUE CROSS/BLUE SHIELD | Source: Ambulatory Visit | Attending: Gastroenterology | Admitting: Gastroenterology

## 2017-08-31 ENCOUNTER — Encounter
Admission: RE | Disposition: A | Discharge status: Home / Self Care | Payer: Self-pay | Source: Ambulatory Visit | Attending: Gastroenterology

## 2017-08-31 ENCOUNTER — Encounter: Payer: Self-pay | Admitting: Certified Registered Nurse Anesthetist

## 2017-08-31 ENCOUNTER — Ambulatory Visit: Payer: BLUE CROSS/BLUE SHIELD | Admitting: Certified Registered Nurse Anesthetist

## 2017-08-31 DIAGNOSIS — Z79899 Other long term (current) drug therapy: Secondary | ICD-10-CM | POA: Insufficient documentation

## 2017-08-31 DIAGNOSIS — F17210 Nicotine dependence, cigarettes, uncomplicated: Secondary | ICD-10-CM | POA: Insufficient documentation

## 2017-08-31 DIAGNOSIS — Z7951 Long term (current) use of inhaled steroids: Secondary | ICD-10-CM | POA: Insufficient documentation

## 2017-08-31 DIAGNOSIS — R600 Localized edema: Secondary | ICD-10-CM | POA: Diagnosis not present

## 2017-08-31 DIAGNOSIS — Z820 Family history of epilepsy and other diseases of the nervous system: Secondary | ICD-10-CM | POA: Insufficient documentation

## 2017-08-31 DIAGNOSIS — Z801 Family history of malignant neoplasm of trachea, bronchus and lung: Secondary | ICD-10-CM | POA: Diagnosis not present

## 2017-08-31 DIAGNOSIS — Z888 Allergy status to other drugs, medicaments and biological substances status: Secondary | ICD-10-CM | POA: Insufficient documentation

## 2017-08-31 DIAGNOSIS — I10 Essential (primary) hypertension: Secondary | ICD-10-CM | POA: Diagnosis not present

## 2017-08-31 DIAGNOSIS — D649 Anemia, unspecified: Secondary | ICD-10-CM | POA: Diagnosis not present

## 2017-08-31 DIAGNOSIS — Z86010 Personal history of colonic polyps: Secondary | ICD-10-CM | POA: Insufficient documentation

## 2017-08-31 DIAGNOSIS — E538 Deficiency of other specified B group vitamins: Secondary | ICD-10-CM | POA: Diagnosis not present

## 2017-08-31 DIAGNOSIS — Z1211 Encounter for screening for malignant neoplasm of colon: Secondary | ICD-10-CM | POA: Insufficient documentation

## 2017-08-31 DIAGNOSIS — D124 Benign neoplasm of descending colon: Secondary | ICD-10-CM | POA: Diagnosis not present

## 2017-08-31 DIAGNOSIS — J45909 Unspecified asthma, uncomplicated: Secondary | ICD-10-CM | POA: Insufficient documentation

## 2017-08-31 DIAGNOSIS — Z885 Allergy status to narcotic agent status: Secondary | ICD-10-CM | POA: Insufficient documentation

## 2017-08-31 DIAGNOSIS — K5790 Diverticulosis of intestine, part unspecified, without perforation or abscess without bleeding: Secondary | ICD-10-CM | POA: Insufficient documentation

## 2017-08-31 DIAGNOSIS — Z880 Allergy status to penicillin: Secondary | ICD-10-CM | POA: Diagnosis not present

## 2017-08-31 DIAGNOSIS — Z808 Family history of malignant neoplasm of other organs or systems: Secondary | ICD-10-CM | POA: Insufficient documentation

## 2017-08-31 DIAGNOSIS — Z91013 Allergy to seafood: Secondary | ICD-10-CM | POA: Insufficient documentation

## 2017-08-31 DIAGNOSIS — K5730 Diverticulosis of large intestine without perforation or abscess without bleeding: Secondary | ICD-10-CM

## 2017-08-31 DIAGNOSIS — Z803 Family history of malignant neoplasm of breast: Secondary | ICD-10-CM | POA: Diagnosis not present

## 2017-08-31 HISTORY — PX: COLONOSCOPY WITH PROPOFOL: SHX5780

## 2017-08-31 LAB — SURGICAL PATHOLOGY

## 2017-08-31 SURGERY — COLONOSCOPY WITH PROPOFOL
Anesthesia: General

## 2017-08-31 MED ORDER — LIDOCAINE HCL (PF) 2 % IJ SOLN
INTRAMUSCULAR | Status: AC
  Filled 2017-08-31: qty 10

## 2017-08-31 MED ORDER — PROPOFOL 500 MG/50ML IV EMUL
INTRAVENOUS | Status: AC
  Filled 2017-08-31: qty 50

## 2017-08-31 MED ORDER — LIDOCAINE HCL (CARDIAC) 20 MG/ML IV SOLN
INTRAVENOUS | Status: DC | PRN
  Administered 2017-08-31: 50 mg via INTRAVENOUS

## 2017-08-31 MED ORDER — PROPOFOL 10 MG/ML IV BOLUS
INTRAVENOUS | Status: DC | PRN
  Administered 2017-08-31: 100 mg via INTRAVENOUS
  Administered 2017-08-31 (×2): 25 mg via INTRAVENOUS

## 2017-08-31 MED ORDER — SODIUM CHLORIDE 0.9 % IV SOLN
INTRAVENOUS | Status: DC
  Administered 2017-08-31: 14:00:00 via INTRAVENOUS

## 2017-08-31 MED ORDER — PROPOFOL 500 MG/50ML IV EMUL
INTRAVENOUS | Status: DC | PRN
  Administered 2017-08-31: 140 ug/kg/min via INTRAVENOUS

## 2017-08-31 NOTE — Op Note (Addendum)
[facility] Patient Name: Brenda [name] Procedure Date: 08/31/2017 2:21 PM MRN: [id number] Account #: [id number] Date of Birth: 1961/02/10 Admit Type: Outpatient Age: 57 Room: [facility] ENDO ROOM 4 Gender: Female Note Status: Finalized Procedure:            Colonoscopy Indications:          High risk colon cancer surveillance: Personal history                        of colonic polyps, Inadequate prep in right colon Providers:            Lin Landsman MD, MD Medicines:            Monitored Anesthesia Care Complications:        No immediate complications. Estimated blood loss: None. Procedure:            Pre-Anesthesia Assessment:                       - Prior to the procedure, a History and Physical was                        performed, and patient medications and allergies were                        reviewed. The patient is competent. The risks and                        benefits of the procedure and the sedation options and                        risks were discussed with the patient. All questions                        were answered and informed consent was obtained.                        Patient identification and proposed procedure were                        verified by the physician, the nurse, the                        anesthesiologist, the anesthetist and the technician in                        the pre-procedure area in the procedure room in the                        endoscopy suite. Mental Status Examination: alert and                        oriented. Airway Examination: normal oropharyngeal                        airway and neck mobility. Respiratory Examination:                        clear to auscultation. CV Examination: normal.  Prophylactic Antibiotics: The patient does not require                        prophylactic antibiotics. Prior Anticoagulants: The                        patient has taken aspirin. ASA  Grade Assessment: III -                        A patient with severe systemic disease. After reviewing                        the risks and benefits, the patient was deemed in                        satisfactory condition to undergo the procedure. The                        anesthesia plan was to use monitored anesthesia care                        (MAC). Immediately prior to administration of                        medications, the patient was re-assessed for adequacy                        to receive sedatives. The heart rate, respiratory rate,                        oxygen saturations, blood pressure, adequacy of                        pulmonary ventilation, and response to care were                        monitored throughout the procedure. The physical status                        of the patient was re-assessed after the procedure.                       After obtaining informed consent, the colonoscope was                        passed under direct vision. Throughout the procedure,                        the patient's blood pressure, pulse, and oxygen                        saturations were monitored continuously. The                        Colonoscope was introduced through the anus and                        advanced to the the cecum, identified by appendiceal  orifice and ileocecal valve. The colonoscopy was                        performed without difficulty. The patient tolerated the                        procedure well. The quality of the bowel preparation                        was evaluated using the BBPS [facility] Bowel Preparation                        Scale) with scores of: Right Colon = 2 (minor amount of                        residual staining, small fragments of stool and/or                        opaque liquid, but mucosa seen well), Transverse Colon                        = 2 (minor amount of residual staining, small fragments                         of stool and/or opaque liquid, but mucosa seen well)                        and Left Colon = 3 (entire mucosa seen well with no                        residual staining, small fragments of stool or opaque                        liquid). The total BBPS score equals 7. Findings:      The perianal and digital rectal examinations were normal. Pertinent       negatives include normal sphincter tone and no palpable rectal lesions.      A 5 mm polyp was found in the descending colon. The polyp was sessile.       The polyp was removed with a cold snare. Resection and retrieval were       complete.      Semi-liquid, thick adherent retained stool was found in the transverse       colon, in the ascending colon and in the cecum, precluding       visualization, improved compared to colonoscopy from day before. Lavage       of the area was performed using a moderate amount of sterile water,       resulting in clearance with fair visualization.      Ulcers from prior polypectomy sites identified in descending colon      The retroflexed view of the distal rectum and anal verge was normal and       showed no anal or rectal abnormalities.      A few diverticula were found in the sigmoid colon and ascending colon.       There was no evidence of diverticular bleeding. Impression:           - One 5 mm polyp in the descending colon, removed  with                        a cold snare. Resected and retrieved.                       - Stool in the transverse colon, in the ascending colon                        and in the cecum.                       - The distal rectum and anal verge are normal on                        retroflexion view.                       - Moderate diverticulosis in the sigmoid colon and in                        the ascending colon. There was no evidence of                        diverticular bleeding. Recommendation:       - Discharge patient to home.                       - Resume  previous diet today.                       - Continue present medications.                       - Await pathology results.                       - Repeat colonoscopy in 3 years for surveillance of                        multiple polyps. Procedure Code(s):    --- Professional ---                       [phone], Colonoscopy, flexible; with removal of tumor(s),                        polyp(s), or other lesion(s) by snare technique Diagnosis Code(s):    --- Professional ---                       Z86.010, Personal history of colonic polyps                       D12.4, Benign neoplasm of descending colon                       K57.30, Diverticulosis of large intestine without                        perforation or abscess without bleeding CPT copyright 2017 American Medical Association. All rights reserved. The codes documented in this report are preliminary and upon coder review may  be revised to meet current compliance requirements. Dr. Rod Can  Kalle Bernath Raeanne Gathers MD, MD 08/31/2017 2:52:23 PM This report has been signed electronically. Number of Addenda: 0 Note Initiated On: 08/31/2017 2:21 PM Scope Withdrawal Time: 0 hours 13 minutes 3 seconds  Total Procedure Duration: 0 hours 15 minutes 9 seconds       [facility]

## 2017-08-31 NOTE — H&P (Signed)
Cephas Darby, MD [address]  [place]  [place], [place] [address]  Main: [phone]  Fax: [phone] Pager: [phone]  Primary Care Physician:  Elgie Collard, MD Primary Gastroenterologist:  Dr. Cephas Darby  Pre-Procedure History & Physical: HPI:  Brenda [name] is a 57 y.o. female is here for an colonoscopy.   Past Medical History:  Diagnosis Date  . Anemia   . Asthma   . Atypical pneumonia    a. 12/2013  . B12 deficiency 10/07/2014  . Clostridium difficile colitis    a. 12/2013.  Marland Kitchen Hypertension   . Lower extremity edema     Past Surgical History:  Procedure Laterality Date  . KNEE SURGERY Left 2014  . NECK SURGERY    . SHOULDER SURGERY Left   . THROAT SURGERY    . TONSILLECTOMY      Prior to Admission medications   Medication Sig Start Date End Date Taking? Authorizing Provider  albuterol (PROVENTIL HFA;VENTOLIN HFA) 108 (90 BASE) MCG/ACT inhaler Inhale 2 puffs into the lungs every 6 (six) hours as needed for wheezing or shortness of breath. 01/30/14   Juanito Doom, MD  aspirin 81 MG tablet Take 81 mg by mouth daily.    [provider]  Cholecalciferol (VITAMIN D-3) 1000 UNITS CAPS Take 1 capsule by mouth daily.    [provider]  co-enzyme Q-10 30 MG capsule Take 30 mg by mouth 3 (three) times daily.    [provider]  cyanocobalamin (,VITAMIN B-12,) 1000 MCG/ML injection Inject 1,000 mcg into the muscle every 30 (thirty) days.    [provider]  diclofenac sodium (VOLTAREN) 1 % GEL APPLY 1 INCH TO LEFT KNEE 2-3 TIMES DAILY 01/21/17   [provider]  folic acid (FOLVITE) 1 MG tablet Take 1 mg by mouth daily.    [provider]  furosemide (LASIX) 20 MG tablet Take 1 tablet (20 mg total) by mouth daily. 01/11/14   Theora Gianotti, NP  metoprolol (LOPRESSOR) 50 MG tablet Take 50 mg by mouth at bedtime.     [provider]  Multiple Vitamins-Minerals (MULTIVITAMIN  GUMMIES ADULT PO) Take 1 Dose by mouth daily.    [provider]  niacin 250 MG tablet Take 250 mg by mouth at bedtime.    [provider]  Omega-3 Fatty Acids (FISH OIL) 1000 MG CAPS Take by mouth.    [provider]  POTASSIUM CHLORIDE PO Take 1 tablet by mouth daily.    [provider]  Probiotic Product (PROBIOTIC & ACIDOPHILUS EX ST PO) Take 1 Dose by mouth daily.    [provider]    Allergies as of 08/30/2017 - Review Complete 08/30/2017  Allergen Reaction Noted  . Codeine Nausea And Vomiting 02/20/2013  . Penicillins  02/20/2013  . Shellfish allergy Diarrhea and Nausea And Vomiting 02/20/2013  . Spiriva [tiotropium bromide monohydrate] Other (See Comments) 02/20/2013    Family History  Problem Relation Age of Onset  . Multiple myeloma Mother   . Breast cancer Maternal Aunt   . Lung cancer Maternal Grandmother   . Parkinson's disease Father     Social History   Socioeconomic History  . Marital status: Married    Spouse name: Not on file  . Number of children: Not on file  . Years of education: Not on file  . Highest education level: Not on file  Occupational History  . Not on file  Social Needs  . Financial  resource strain: Not on file  . Food insecurity:    Worry: Not on file    Inability: Not on file  . Transportation needs:    Medical: Not on file    Non-medical: Not on file  Tobacco Use  . Smoking status: Current Some Day Smoker    Packs/day: 0.50    Years: 8.00    Pack years: 4.00    Types: Cigarettes    Last attempt to quit: 01/31/2012    Years since quitting: 5.5  . Smokeless tobacco: Never Used  Substance and Sexual Activity  . Alcohol use: Yes    Comment: SOCIAL DRINKER 1-2 vodka a week  . Drug use: No  . Sexual activity: Not on file  Lifestyle  . Physical activity:    Days per week: Not on file    Minutes per session: Not on file  . Stress: Not on file  Relationships  . Social connections:     Talks on phone: Not on file    Gets together: Not on file    Attends religious service: Not on file    Active member of club or organization: Not on file    Attends meetings of clubs or organizations: Not on file    Relationship status: Not on file  . Intimate partner violence:    Fear of current or ex partner: Not on file    Emotionally abused: Not on file    Physically abused: Not on file    Forced sexual activity: Not on file  Other Topics Concern  . Not on file  Social History Narrative  . Not on file    Review of Systems: See HPI, otherwise negative ROS  Physical Exam: BP (!) 128/115   Pulse (!) 101   Temp 97.7 F (36.5 C) (Tympanic)   Resp 20   Ht '5\' 4"'$  (1.626 m)   Wt 170 lb (77.1 kg)   BMI 29.18 kg/m  General:   Alert,  pleasant and cooperative in NAD Head:  Normocephalic and atraumatic. Neck:  Supple; no masses or thyromegaly. Lungs:  Clear throughout to auscultation.    Heart:  Regular rate and rhythm. Abdomen:  Soft, nontender and nondistended. Normal bowel sounds, without guarding, and without rebound.   Neurologic:  Alert and  oriented x4;  grossly normal neurologically.  Impression/Plan: Brenda [name] is here for an colonoscopy to be performed for poor prep and multiple colon polyps  Risks, benefits, limitations, and alternatives regarding  colonoscopy have been reviewed with the patient.  Questions have been answered.  All parties agreeable.   Sherri Sear, MD  08/31/2017, 2:22 PM

## 2017-08-31 NOTE — Transfer of Care (Signed)
Immediate Anesthesia Transfer of Care Note  Patient: Brenda [name]  Procedure(s) Performed: COLONOSCOPY WITH PROPOFOL (N/A )  Patient Location: PACU and Endoscopy Unit  Anesthesia Type:General  Level of Consciousness: drowsy  Airway & Oxygen Therapy: Patient Spontanous Breathing and Patient connected to nasal cannula oxygen  Post-op Assessment: Report given to RN and Post -op Vital signs reviewed and stable  Post vital signs: Reviewed and stable  Last Vitals:  Vitals Value Taken Time  BP 99/72 08/31/2017  2:54 PM  Temp    Pulse 94 08/31/2017  2:55 PM  Resp 23 08/31/2017  2:55 PM  SpO2 100 % 08/31/2017  2:55 PM  Vitals shown include unvalidated device data.  Last Pain:  Vitals:   08/31/17 1453  TempSrc: (P) Tympanic         Complications: No apparent anesthesia complications

## 2017-08-31 NOTE — Anesthesia Post-op Follow-up Note (Signed)
Anesthesia QCDR form completed.        

## 2017-08-31 NOTE — Anesthesia Preprocedure Evaluation (Signed)
Anesthesia Evaluation  Patient identified by MRN, date of birth, ID band Patient awake    Reviewed: Allergy & Precautions, NPO status , Patient's Chart, lab work & pertinent test results  History of Anesthesia Complications Negative for: history of anesthetic complications  Airway Mallampati: II       Dental   Pulmonary asthma , neg sleep apnea, COPD,  COPD inhaler, Current Smoker,           Cardiovascular hypertension, Pt. on medications and Pt. on home beta blockers (-) Past MI and (-) CHF (-) dysrhythmias (-) Valvular Problems/Murmurs     Neuro/Psych neg Seizures    GI/Hepatic Neg liver ROS, neg GERD  ,  Endo/Other  neg diabetes  Renal/GU negative Renal ROS     Musculoskeletal   Abdominal   Peds  Hematology  (+) anemia ,   Anesthesia Other Findings   Reproductive/Obstetrics                             Anesthesia Physical Anesthesia Plan  ASA: II  Anesthesia Plan: General   Post-op Pain Management:    Induction: Intravenous  PONV Risk Score and Plan: 2 and TIVA and Propofol infusion  Airway Management Planned: Nasal Cannula  Additional Equipment:   Intra-op Plan:   Post-operative Plan:   Informed Consent: I have reviewed the patients History and Physical, chart, labs and discussed the procedure including the risks, benefits and alternatives for the proposed anesthesia with the patient or authorized representative who has indicated his/her understanding and acceptance.     Plan Discussed with:   Anesthesia Plan Comments:         Anesthesia Quick Evaluation

## 2017-09-01 ENCOUNTER — Encounter: Payer: Self-pay | Admitting: Gastroenterology

## 2017-09-01 NOTE — Anesthesia Postprocedure Evaluation (Signed)
Anesthesia Post Note  Patient: Brenda [name]  Procedure(s) Performed: COLONOSCOPY WITH PROPOFOL (N/A )  Patient location during evaluation: Endoscopy Anesthesia Type: General Level of consciousness: awake and alert Pain management: pain level controlled Vital Signs Assessment: post-procedure vital signs reviewed and stable Respiratory status: spontaneous breathing and respiratory function stable Cardiovascular status: stable Anesthetic complications: no     Last Vitals:  Vitals:   08/31/17 1503 08/31/17 1513  BP: (!) 115/94 (!) 129/105  Pulse: (!) 110 89  Resp: 19 18  Temp:    SpO2: 90% 97%    Last Pain:  Vitals:   09/01/17 0733  TempSrc:   PainSc: 0-No pain                 [name]

## 2017-09-02 ENCOUNTER — Encounter: Payer: Self-pay | Admitting: Gastroenterology

## 2017-09-02 LAB — SURGICAL PATHOLOGY

## 2017-10-23 NOTE — Progress Notes (Signed)
[facility]  Telephone:([phone]) [phone] Fax:(336) [phone]  ID: Brenda [name] OB: [birth year]  MR#: [id number]  PXT#:[id number]  Patient Care Team: Elgie Collard, MD as PCP - General (Obstetrics and Gynecology)  CHIEF COMPLAINT: B-12 deficiency anemia.  INTERVAL HISTORY: Patient returns to clinic today for repeat laboratory work and further evaluation.  She continues to feel well and remains asymptomatic.  She does not complain of weakness or fatigue today.  She has no neurologic complaints. She has good appetite and denies weight loss. She denies any recent fevers or illnesses. She has no chest pain or shortness of breath. She denies any nausea, vomiting, constipation, or diarrhea. She has no urinary complaints.  Patient offers no specific complaints today.    REVIEW OF SYSTEMS:   Review of Systems  Constitutional: Negative.  Negative for fever, malaise/fatigue and weight loss.  Respiratory: Negative.  Negative for cough and shortness of breath.   Cardiovascular: Negative.  Negative for chest pain and leg swelling.  Gastrointestinal: Negative.  Negative for abdominal pain, blood in stool and melena.  Genitourinary: Negative.  Negative for dysuria.  Musculoskeletal: Negative.  Negative for back pain.  Skin: Negative.  Negative for rash.  Neurological: Negative.  Negative for sensory change, focal weakness and weakness.  Psychiatric/Behavioral: Negative.  The patient is not nervous/anxious.     As per HPI. Otherwise, a complete review of systems is negative.  PAST MEDICAL HISTORY: Past Medical History:  Diagnosis Date  . Anemia   . Asthma   . Atypical pneumonia    a. 12/2013  . B12 deficiency 10/07/2014  . Clostridium difficile colitis    a. 12/2013.  Marland Kitchen Hypertension   . Lower extremity edema     PAST SURGICAL HISTORY: Past Surgical History:  Procedure Laterality Date  . COLONOSCOPY WITH PROPOFOL N/A 08/30/2017   Procedure: COLONOSCOPY WITH PROPOFOL;   Surgeon: Lin Landsman, MD;  [facility] ENDOSCOPY;  Service: Gastroenterology;  Laterality: N/A;  . COLONOSCOPY WITH PROPOFOL N/A 08/31/2017   Procedure: COLONOSCOPY WITH PROPOFOL;  Surgeon: Lin Landsman, MD;  Location[facility] ENDOSCOPY;  Service: Gastroenterology;  Laterality: N/A;  . KNEE SURGERY Left 2014  . NECK SURGERY    . SHOULDER SURGERY Left   . THROAT SURGERY    . TONSILLECTOMY      FAMILY HISTORY: Family History  Problem Relation Age of Onset  . Multiple myeloma Mother   . Breast cancer Maternal Aunt   . Lung cancer Maternal Grandmother   . Parkinson's disease Father     ADVANCED DIRECTIVES (Y/N):  N  HEALTH MAINTENANCE: Social History   Tobacco Use  . Smoking status: Current Some Day Smoker    Packs/day: 0.50    Years: 8.00    Pack years: 4.00    Types: Cigarettes    Last attempt to quit: 01/31/2012    Years since quitting: 5.7  . Smokeless tobacco: Never Used  Substance Use Topics  . Alcohol use: Yes    Comment: SOCIAL DRINKER 1-2 vodka a week  . Drug use: No     Colonoscopy:  PAP:  Bone density:  Lipid panel:  Allergies  Allergen Reactions  . Codeine Nausea And Vomiting  . Penicillins     DOES NOT KNOW WHAT TYPE OF REACTION  . Shellfish Allergy Diarrhea and Nausea And Vomiting  . Spiriva [Tiotropium Bromide Monohydrate] Other (See Comments)    CLOSES THROAT    Current Outpatient Medications  Medication Sig Dispense Refill  . albuterol (PROVENTIL  HFA;VENTOLIN HFA) 108 (90 BASE) MCG/ACT inhaler Inhale 2 puffs into the lungs every 6 (six) hours as needed for wheezing or shortness of breath. 1 Inhaler 2  . aspirin 81 MG tablet Take 81 mg by mouth daily.    Marland Kitchen co-enzyme Q-10 30 MG capsule Take 30 mg by mouth 3 (three) times daily.    . cyanocobalamin (,VITAMIN B-12,) 1000 MCG/ML injection Inject 1,000 mcg into the muscle every 30 (thirty) days.    . diclofenac sodium (VOLTAREN) 1 % GEL APPLY 1 INCH TO LEFT KNEE 2-3 TIMES DAILY  2  .  folic acid (FOLVITE) 1 MG tablet Take 1 mg by mouth daily.    . furosemide (LASIX) 20 MG tablet Take 1 tablet (20 mg total) by mouth daily. 30 tablet 0  . metoprolol (LOPRESSOR) 50 MG tablet Take 100 mg by mouth at bedtime.     . Multiple Vitamins-Minerals (MULTIVITAMIN GUMMIES ADULT PO) Take 1 Dose by mouth daily.    . niacin 250 MG tablet Take 250 mg by mouth at bedtime.    . Omega-3 Fatty Acids (FISH OIL) 1000 MG CAPS Take by mouth.    Marland Kitchen POTASSIUM CHLORIDE PO Take 1 tablet by mouth daily.    . Probiotic Product (PROBIOTIC & ACIDOPHILUS EX ST PO) Take 1 Dose by mouth daily.    . Cholecalciferol (VITAMIN D-3) 1000 UNITS CAPS Take 1 capsule by mouth daily.     No current facility-administered medications for this visit.     OBJECTIVE: Vitals:   10/26/17 1507 10/26/17 1515  BP:  (!) 145/104  Pulse:  91  Resp: 16   Temp:  98.9 F (37.2 C)     Body mass index is 29.74 kg/m.    ECOG FS:0 - Asymptomatic  General: Well-developed, well-nourished, no acute distress. Eyes: Pink conjunctiva, anicteric sclera. Lungs: Clear to auscultation bilaterally. Heart: Regular rate and rhythm. No rubs, murmurs, or gallops. Abdomen: Soft, nontender, nondistended. No organomegaly noted, normoactive bowel sounds. Musculoskeletal: No edema, cyanosis, or clubbing. Neuro: Alert, answering all questions appropriately. Cranial nerves grossly intact. Skin: No rashes or petechiae noted. Psych: Normal affect.  LAB RESULTS:  Lab Results  Component Value Date   NA 141 01/19/2014   K 3.6 01/19/2014   CL 106 01/19/2014   CO2 25 01/19/2014   GLUCOSE 101 (H) 01/19/2014   BUN 3 (L) 01/19/2014   CREATININE 0.62 01/19/2014   CALCIUM 8.1 (L) 01/19/2014   GFRNONAA >60 01/19/2014   GFRAA >60 01/19/2014    Lab Results  Component Value Date   WBC 13.1 (H) 12/26/2013   NEUTROABS 9.4 (H) 12/23/2013   HGB 12.1 12/26/2013   HCT 35.5 12/26/2013   MCV 105 (H) 12/26/2013   PLT 257 12/26/2013     STUDIES: No  results found.  ASSESSMENT: B-12 deficiency anemia.  PLAN:    1. B-12 deficiency anemia: Patient's hemoglobin remains within normal limits at 13.7.  She has a mildly elevated MCV.  Iron panel, B12, and folate are all also within normal limits.  Will proceed with B12 injection today.  After lengthy discussion with the patient was agreed upon that she can receive her B12 injections at her primary care office.  Recommended a B12 injection in 3 months and then recheck laboratory work in 6 months for consideration of additional injections.  No follow-up has been made.  Please refer patient back if there are any questions or concerns.  Approximately 20 minutes was spent in discussion of which greater than  50% was consultation.    Patient expressed understanding and was in agreement with this plan. She also understands that She can call clinic at any time with any questions, concerns, or complaints.    Lloyd Huger, MD   10/30/2017 7:01 AM

## 2017-10-26 ENCOUNTER — Inpatient Hospital Stay: Payer: BLUE CROSS/BLUE SHIELD | Attending: Oncology | Admitting: Oncology

## 2017-10-26 ENCOUNTER — Other Ambulatory Visit: Payer: Self-pay

## 2017-10-26 ENCOUNTER — Inpatient Hospital Stay: Payer: BLUE CROSS/BLUE SHIELD

## 2017-10-26 ENCOUNTER — Encounter: Payer: Self-pay | Admitting: Oncology

## 2017-10-26 VITALS — BP 145/104 | HR 91 | Temp 98.9°F | Resp 16 | Ht 64.5 in | Wt 176.0 lb

## 2017-10-26 DIAGNOSIS — Z8719 Personal history of other diseases of the digestive system: Secondary | ICD-10-CM | POA: Insufficient documentation

## 2017-10-26 DIAGNOSIS — R609 Edema, unspecified: Secondary | ICD-10-CM | POA: Diagnosis not present

## 2017-10-26 DIAGNOSIS — I10 Essential (primary) hypertension: Secondary | ICD-10-CM | POA: Diagnosis not present

## 2017-10-26 DIAGNOSIS — Z803 Family history of malignant neoplasm of breast: Secondary | ICD-10-CM | POA: Insufficient documentation

## 2017-10-26 DIAGNOSIS — Z79899 Other long term (current) drug therapy: Secondary | ICD-10-CM | POA: Diagnosis not present

## 2017-10-26 DIAGNOSIS — J45909 Unspecified asthma, uncomplicated: Secondary | ICD-10-CM | POA: Insufficient documentation

## 2017-10-26 DIAGNOSIS — D649 Anemia, unspecified: Secondary | ICD-10-CM | POA: Diagnosis not present

## 2017-10-26 DIAGNOSIS — Z800 Family history of malignant neoplasm of digestive organs: Secondary | ICD-10-CM | POA: Insufficient documentation

## 2017-10-26 DIAGNOSIS — F17210 Nicotine dependence, cigarettes, uncomplicated: Secondary | ICD-10-CM | POA: Diagnosis not present

## 2017-10-26 DIAGNOSIS — E538 Deficiency of other specified B group vitamins: Secondary | ICD-10-CM | POA: Diagnosis present

## 2017-10-26 DIAGNOSIS — Z7982 Long term (current) use of aspirin: Secondary | ICD-10-CM | POA: Insufficient documentation

## 2017-10-26 MED ORDER — CYANOCOBALAMIN 1000 MCG/ML IJ SOLN
1000.0000 ug | Freq: Once | INTRAMUSCULAR | Status: AC
  Administered 2017-10-26: 1000 ug via INTRAMUSCULAR
  Filled 2017-10-26: qty 1

## 2017-10-26 NOTE — Progress Notes (Signed)
Patient here for follow up. She has questions about her lab work and Vitamin

## 2017-12-10 ENCOUNTER — Encounter: Payer: Self-pay | Admitting: Oncology

## 2018-01-12 ENCOUNTER — Encounter: Payer: Self-pay | Admitting: Oncology

## 2018-12-18 ENCOUNTER — Emergency Department
Admission: EM | Admit: 2018-12-18 | Discharge: 2018-12-18 | Disposition: A | Discharge status: Home / Self Care | Payer: BLUE CROSS/BLUE SHIELD | Attending: Emergency Medicine | Admitting: Emergency Medicine

## 2018-12-18 ENCOUNTER — Other Ambulatory Visit: Payer: Self-pay

## 2018-12-18 ENCOUNTER — Emergency Department: Payer: BLUE CROSS/BLUE SHIELD

## 2018-12-18 DIAGNOSIS — J45909 Unspecified asthma, uncomplicated: Secondary | ICD-10-CM | POA: Diagnosis not present

## 2018-12-18 DIAGNOSIS — M79604 Pain in right leg: Secondary | ICD-10-CM | POA: Diagnosis present

## 2018-12-18 DIAGNOSIS — Z79899 Other long term (current) drug therapy: Secondary | ICD-10-CM | POA: Diagnosis not present

## 2018-12-18 DIAGNOSIS — F17210 Nicotine dependence, cigarettes, uncomplicated: Secondary | ICD-10-CM | POA: Insufficient documentation

## 2018-12-18 DIAGNOSIS — M87051 Idiopathic aseptic necrosis of right femur: Secondary | ICD-10-CM | POA: Diagnosis not present

## 2018-12-18 DIAGNOSIS — I110 Hypertensive heart disease with heart failure: Secondary | ICD-10-CM | POA: Insufficient documentation

## 2018-12-18 DIAGNOSIS — I5032 Chronic diastolic (congestive) heart failure: Secondary | ICD-10-CM | POA: Diagnosis not present

## 2018-12-18 DIAGNOSIS — Z7982 Long term (current) use of aspirin: Secondary | ICD-10-CM | POA: Insufficient documentation

## 2018-12-18 DIAGNOSIS — J449 Chronic obstructive pulmonary disease, unspecified: Secondary | ICD-10-CM | POA: Insufficient documentation

## 2018-12-18 MED ORDER — TRAMADOL HCL 50 MG PO TABS: 50.0000 mg | ORAL_TABLET | Freq: Four times a day (QID) | ORAL | 0 refills | Status: DC | PRN

## 2018-12-18 NOTE — ED Triage Notes (Signed)
Patient reports right upper leg pain for 2 weeks.  Reports she has seen her PMD and started on prednisone.  Reports pain worse over the past 2 days.

## 2018-12-18 NOTE — ED Provider Notes (Signed)
[facility] Emergency Department Provider Note  ____________________________________________   First MD Initiated Contact with Patient 12/18/18 [phone]     (approximate)  I have reviewed the triage vital signs and the nursing notes.   HISTORY  Chief Complaint Leg Pain    HPI Brenda [name] is a 58 y.o. female presents emergency department complaining of right hip pain and upper leg pain for 1 months.  She states she was seen at Dr. Georga Bora office and was given a steroid taper.  She states she had taken Tylenol and ibuprofen prior to this.  She states pain has continued.  She denies any numbness or tingling.  She denies any injury.  She denies any leg swelling.  Pain is worse with bearing weight.  She works as a Scientist, water quality at [employer].  She states she stands all day on the hard floors and is unsure if this is because problem.  Patient is a smoker.    Past Medical History:  Diagnosis Date  . Anemia   . Asthma   . Atypical pneumonia    a. 12/2013  . B12 deficiency 10/07/2014  . Clostridium difficile colitis    a. 12/2013.  Marland Kitchen Hypertension   . Lower extremity edema     Patient Active Problem List   Diagnosis Date Noted  . Encounter for screening colonoscopy   . B12 deficiency 10/07/2014  . SOB (shortness of breath) 01/09/2014  . Chronic diastolic CHF (congestive heart failure) ([place])   . Atypical pneumonia   . Clostridium difficile colitis   . COPD (chronic obstructive pulmonary disease) ([place])   . 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 Peroneal tendinitis of left lower extremity 02/20/2013    Past Surgical History:  Procedure Laterality Date  . COLONOSCOPY WITH PROPOFOL N/A 08/30/2017   Procedure: COLONOSCOPY WITH PROPOFOL;  Surgeon: Lin Landsman, MD;  Location: [facility] ENDOSCOPY;  Service: Gastroenterology;  Laterality: N/A;  . COLONOSCOPY WITH PROPOFOL N/A 08/31/2017   Procedure: COLONOSCOPY WITH PROPOFOL;  Surgeon: Lin Landsman,  MD;  Location: [facility] ENDOSCOPY;  Service: Gastroenterology;  Laterality: N/A;  . KNEE SURGERY Left 2014  . NECK SURGERY    . SHOULDER SURGERY Left   . THROAT SURGERY    . TONSILLECTOMY      Prior to Admission medications   Medication Sig Start Date End Date Taking? Authorizing Provider  albuterol (PROVENTIL HFA;VENTOLIN HFA) 108 (90 BASE) MCG/ACT inhaler Inhale 2 puffs into the lungs every 6 (six) hours as needed for wheezing or shortness of breath. 01/30/14   Juanito Doom, MD  aspirin 81 MG tablet Take 81 mg by mouth daily.    [provider]  Cholecalciferol (VITAMIN D-3) 1000 UNITS CAPS Take 1 capsule by mouth daily.    [provider]  co-enzyme Q-10 30 MG capsule Take 30 mg by mouth 3 (three) times daily.    [provider]  cyanocobalamin (,VITAMIN B-12,) 1000 MCG/ML injection Inject 1,000 mcg into the muscle every 30 (thirty) days.    [provider]  diclofenac sodium (VOLTAREN) 1 % GEL APPLY 1 INCH TO LEFT KNEE 2-3 TIMES DAILY 01/21/17   [provider]  folic acid (FOLVITE) 1 MG tablet Take 1 mg by mouth daily.    [provider]  furosemide (LASIX) 20 MG tablet Take 1 tablet (20 mg total) by mouth daily. 01/11/14   Theora Gianotti, NP  metoprolol (LOPRESSOR) 50 MG tablet Take 100 mg by mouth at bedtime.  [provider]  Multiple Vitamins-Minerals (MULTIVITAMIN GUMMIES ADULT PO) Take 1 Dose by mouth daily.    [provider]  niacin 250 MG tablet Take 250 mg by mouth at bedtime.    [provider]  Omega-3 Fatty Acids (FISH OIL) 1000 MG CAPS Take by mouth.    [provider]  POTASSIUM CHLORIDE PO Take 1 tablet by mouth daily.    [provider]  Probiotic Product (PROBIOTIC & ACIDOPHILUS EX ST PO) Take 1 Dose by mouth daily.    [provider]  traMADol (ULTRAM) 50 MG tablet Take 1 tablet (50 mg total) by mouth every 6 (six) hours as needed. 12/18/18   Versie Starks, PA-C    Allergies Codeine, Penicillins, Shellfish allergy, and Spiriva [tiotropium bromide monohydrate]  Family History  Problem Relation Age of Onset  . Multiple myeloma Mother   . Breast cancer Maternal Aunt   . Lung cancer Maternal Grandmother   . Parkinson's disease Father     Social History Social History   Tobacco Use  . Smoking status: Current Some Day Smoker    Packs/day: 0.50    Years: 8.00    Pack years: 4.00    Types: Cigarettes    Last attempt to quit: 01/31/2012    Years since quitting: 6.8  . Smokeless tobacco: Never Used  Substance Use Topics  . Alcohol use: Yes    Comment: SOCIAL DRINKER 1-2 vodka a week  . Drug use: No    Review of Systems  Constitutional: No fever/chills Eyes: No visual changes. ENT: No sore throat. Respiratory: Denies cough Genitourinary: Negative for dysuria. Musculoskeletal: Negative for back pain.  Positive for right upper leg pain Skin: Negative for rash.    ____________________________________________   PHYSICAL EXAM:  VITAL SIGNS: ED Triage Vitals  Enc Vitals Group     BP 12/18/18 0648 (!) 140/92     Pulse Rate 12/18/18 0648 82     Resp 12/18/18 0648 18     Temp 12/18/18 0648 98.3 F (36.8 C)     Temp Source 12/18/18 0648 Oral     SpO2 12/18/18 0648 95 %     Weight 12/18/18 0644 180 lb (81.6 kg)     Height 12/18/18 0644 5' 4.5" (1.638 m)     Head Circumference --      Peak Flow --      Pain Score 12/18/18 0644 8     Pain Loc --      Pain Edu? --      Excl. in [place]? --     Constitutional: Alert and oriented. Well appearing and in no acute distress. Eyes: Conjunctivae are normal.  Head: Atraumatic. Nose: No congestion/rhinnorhea. Mouth/Throat: Mucous membranes are moist.   Neck:  supple no lymphadenopathy noted Cardiovascular: Normal rate, regular rhythm. Heart sounds are normal Respiratory: Normal respiratory effort.  No retractions, lungs c t a  GU: deferred Musculoskeletal: FROM all  extremities, warm and well perfused, pain reproduced with internal and external rotation of the right hip, right hip is tender to palpation medially, the right lower femur and knee are not tender.  Some varicose veins are noted on the anterior thigh, no redness or swelling noted. Neurologic:  Normal speech and language.  Skin:  Skin is warm, dry and intact. No rash noted. Psychiatric: Mood and affect are normal. Speech and behavior are normal.  ____________________________________________   LABS (all labs ordered are listed, but only abnormal results are displayed)  Labs  Reviewed - No data to display ____________________________________________   ____________________________________________  RADIOLOGY  X-ray of the right hip shows a collapse of part of the right femoral head which may indicate AVN  ____________________________________________   PROCEDURES  Procedure(s) performed: No  Procedures    ____________________________________________   INITIAL IMPRESSION / ASSESSMENT AND PLAN / ED COURSE  Pertinent labs & imaging results that were available during my care of the patient were reviewed by me and considered in my medical decision making (see chart for details).   Patient is a 58 year old female presents emergency department complaining of right upper leg pain for 1 month.  Patient has taken Tylenol, ibuprofen, and was recently placed on a steroid taper by her regular doctor.  She is awaiting a referral to orthopedics.  Physical exam shows the right hip to be tender medially, pain is reproduced with range of motion.  Remainder exam is unremarkable  X-ray of the right hip shows AVN of the right femoral head  Explained findings to the patient.  She is to follow-up with [facility] as previously instructed by her primary care.  Explained her they also have a walk-in clinic that she can go to if worsening.  She can return to emergency department if she is having  increased pain which would cause concern for more of a collapse in the joint space.  She was offered a walker or cane which she is refusing at this time as she states "she is "not quite ready for that ".  She was given a prescription for tramadol.  Encouraged to continue Tylenol and ibuprofen.  She is given a work note for today and tomorrow.  She states she understands instructions and will comply.  She is discharged in stable condition.    Brenda [name] was evaluated in Emergency Department on 12/18/2018 for the symptoms described in the history of present illness. She was evaluated in the context of the global COVID-19 pandemic, which necessitated consideration that the patient might be at risk for infection with the SARS-CoV-2 virus that causes COVID-19. Institutional protocols and algorithms that pertain to the evaluation of patients at risk for COVID-19 are in a state of rapid change based on information released by regulatory bodies including the CDC and federal and state organizations. These policies and algorithms were followed during the patient's care in the ED.   As part of my medical decision making, I reviewed the following data within the [facility] notes reviewed and incorporated, Old chart reviewed, Radiograph reviewed x-ray of the right hip shows AVN of the right femoral head, Notes from prior ED visits and [place] Controlled Substance Database  ____________________________________________   FINAL CLINICAL IMPRESSION(S) / ED DIAGNOSES  Final diagnoses:  Avascular necrosis of femoral head, right (HCC)      NEW MEDICATIONS STARTED DURING THIS VISIT:  New Prescriptions   TRAMADOL (ULTRAM) 50 MG TABLET    Take 1 tablet (50 mg total) by mouth every 6 (six) hours as needed.     Note:  This document was prepared using Dragon voice recognition software and may include unintentional dictation errors.    Versie Starks, PA-C 12/18/18 5537    Nance Pear, MD 12/18/18 1059

## 2018-12-18 NOTE — Discharge Instructions (Signed)
Follow-up with [facility].  Please call for an appointment.  Tell them your x-ray is showing avascular necrosis of the femoral head and the right hip.  Take pain medication if needed.  Try not to bear weight on the affected area.  You may need to use a cane or a walker.  Return to the emergency department if the pain is increasing as this could indicate a collapse of the joint.

## 2018-12-18 NOTE — ED Notes (Signed)
Pt c/o right thigh pain since 11/23/18 was seen by her PCP last week and put on prednisone with most heat. States she has not seen the orthopedist yet. Pt is ambulatory to the room with a limp.

## 2019-01-12 ENCOUNTER — Other Ambulatory Visit: Payer: Self-pay | Admitting: Orthopedic Surgery

## 2019-01-19 ENCOUNTER — Other Ambulatory Visit: Payer: Self-pay | Admitting: Obstetrics and Gynecology

## 2019-01-19 DIAGNOSIS — J449 Chronic obstructive pulmonary disease, unspecified: Secondary | ICD-10-CM

## 2019-01-19 DIAGNOSIS — I10 Essential (primary) hypertension: Secondary | ICD-10-CM

## 2019-01-20 ENCOUNTER — Other Ambulatory Visit: Payer: Self-pay

## 2019-01-20 ENCOUNTER — Ambulatory Visit
Admission: RE | Admit: 2019-01-20 | Discharge: 2019-01-20 | Disposition: A | Discharge status: Home / Self Care | Payer: BLUE CROSS/BLUE SHIELD | Source: Ambulatory Visit | Attending: Obstetrics and Gynecology | Admitting: Obstetrics and Gynecology

## 2019-01-20 ENCOUNTER — Ambulatory Visit
Admission: RE | Admit: 2019-01-20 | Discharge: 2019-01-20 | Disposition: A | Discharge status: Home / Self Care | Payer: BLUE CROSS/BLUE SHIELD | Source: Home / Self Care | Attending: Obstetrics and Gynecology | Admitting: Obstetrics and Gynecology

## 2019-01-20 DIAGNOSIS — J449 Chronic obstructive pulmonary disease, unspecified: Secondary | ICD-10-CM | POA: Insufficient documentation

## 2019-01-20 DIAGNOSIS — I10 Essential (primary) hypertension: Secondary | ICD-10-CM | POA: Diagnosis not present

## 2019-02-07 ENCOUNTER — Encounter
Admission: RE | Admit: 2019-02-07 | Discharge: 2019-02-07 | Disposition: A | Discharge status: Home / Self Care | Payer: BLUE CROSS/BLUE SHIELD | Source: Ambulatory Visit | Attending: Orthopedic Surgery | Admitting: Orthopedic Surgery

## 2019-02-07 ENCOUNTER — Other Ambulatory Visit: Payer: Self-pay

## 2019-02-07 DIAGNOSIS — Z01812 Encounter for preprocedural laboratory examination: Secondary | ICD-10-CM | POA: Insufficient documentation

## 2019-02-07 HISTORY — DX: Unspecified osteoarthritis, unspecified site: M19.90

## 2019-02-07 LAB — URINALYSIS, ROUTINE W REFLEX MICROSCOPIC
Bilirubin Urine: NEGATIVE
Glucose, UA: NEGATIVE mg/dL
Ketones, ur: NEGATIVE mg/dL
Leukocytes,Ua: NEGATIVE
Nitrite: NEGATIVE
Protein, ur: NEGATIVE mg/dL
Specific Gravity, Urine: 1.008 (ref 1.005–1.030)
pH: 6 (ref 5.0–8.0)

## 2019-02-07 LAB — BASIC METABOLIC PANEL
Anion gap: 15 (ref 5–15)
BUN: 9 mg/dL (ref 6–20)
CO2: 25 mmol/L (ref 22–32)
Calcium: 9.1 mg/dL (ref 8.9–10.3)
Chloride: 101 mmol/L (ref 98–111)
Creatinine, Ser: 0.45 mg/dL (ref 0.44–1.00)
GFR calc Af Amer: >60 mL/min (ref >60)
GFR calc non Af Amer: >60 mL/min (ref >60)
Glucose, Bld: 115 mg/dL — ABNORMAL HIGH (ref 70–99)
Potassium: 4 mmol/L (ref 3.5–5.1)
Sodium: 141 mmol/L (ref 135–145)

## 2019-02-07 LAB — PROTIME-INR
INR: 1 (ref 0.8–1.2)
Prothrombin Time: 12.8 seconds (ref 11.4–15.2)

## 2019-02-07 LAB — TYPE AND SCREEN
ABO/RH(D): O POS
Antibody Screen: NEGATIVE

## 2019-02-07 LAB — CBC
HCT: 40 % (ref 36.0–46.0)
Hemoglobin: 13.3 g/dL (ref 12.0–15.0)
MCH: 34.4 pg — ABNORMAL HIGH (ref 26.0–34.0)
MCHC: 33.3 g/dL (ref 30.0–36.0)
MCV: 103.4 fL — ABNORMAL HIGH (ref 80.0–100.0)
Platelets: 150 10*3/uL (ref 150–400)
RBC: 3.87 MIL/uL (ref 3.87–5.11)
RDW: 13.2 % (ref 11.5–15.5)
WBC: 4.9 10*3/uL (ref 4.0–10.5)
nRBC: 0 % (ref 0.0–0.2)

## 2019-02-07 LAB — SURGICAL PCR SCREEN
MRSA, PCR: NEGATIVE
Staphylococcus aureus: NEGATIVE

## 2019-02-07 LAB — APTT: aPTT: 29 seconds (ref 24–36)

## 2019-02-07 MED ORDER — CHLORHEXIDINE GLUCONATE 4 % EX LIQD
60.0000 mL | Freq: Once | CUTANEOUS | Status: DC
  Filled 2019-02-07: qty 60

## 2019-02-07 NOTE — Patient Instructions (Signed)
INSTRUCTIONS FOR SURGERY     Your surgery is scheduled for:   Wednesday, September 30,2020     To find out your arrival time for the day of surgery,          please call [phone] between 1 pm and 3 pm on :  Tuesday, February 14, 2019     When you arrive for surgery, report to the [facility].       Do NOT stop on the first floor to register.    REMEMBER: Instructions that are not followed completely may result in serious medical risk,  up to and including death, or upon the discretion of your surgeon and anesthesiologist,            your surgery may need to be rescheduled.  __X__ 1. Do not eat food after midnight the night before your procedure.                    No gum, candy, lozenger, tic tacs, tums or hard candies.                  ABSOLUTELY NOTHING SOLID IN YOUR MOUTH AFTER MIDNIGHT                    You may drink unlimited clear liquids up to 2 hours before you are scheduled to arrive for surgery.                   Do not drink anything within those 2 hours unless you need to take medicine, then take the                   smallest amount you need.  Clear liquids include:  water, apple juice without pulp,                   any flavor Gatorade, Black coffee, black tea.  Sugar may be added but no dairy/ honey /lemon.                        Broth and jello is not considered a clear liquid.  __x__  2. On the morning of surgery, please brush your teeth with toothpaste and water. You may rinse with                  mouthwash if you wish but DO NOT SWALLOW TOOTHPASTE OR MOUTHWASH  __X___3. NO alcohol for 24 hours before or after surgery.  __x___ 4.  Do NOT smoke or use e-cigarettes for 24 HOURS PRIOR TO SURGERY.                      DO NOT Use any chewable tobacco products for at least 6 hours prior to surgery.  __x___ 5. If you start any new medication after this appointment and prior to surgery,  please                   Bring it with you on the day of surgery.  ___x__ 6. Notify your doctor if there is any change  in your medical condition, such as fever, infection, vomitting,                   Diarrhea or any open sores.  __x___ 7.  USE the CHG SOAP as instructed, the night before surgery and the day of surgery.                   Once you have washed with this soap, do NOT use any of the following: Powders, perfumes                    or lotions. Please do not wear make up, hairpins, clips or nail polish. You MAY wear deodorant.                   Men may shave their face and neck.  Women need to shave 48 hours prior to surgery.                   DO NOT wear ANY jewelry on the day of surgery. If there are rings that are too tight to                    remove easily, please address this prior to the surgery day. Piercings need to be removed.                                                                     NO METAL ON YOUR BODY.                    Do NOT bring any valuables.  If you came to Pre-Admit testing then you will not need license,                     insurance card or credit card.  If you will be staying overnight, please either leave your things in                     the car or have your family be responsible for these items.                     [facility] IS NOT RESPONSIBLE FOR BELONGINGS OR VALUABLES.  ___X__ 8. DO NOT wear contact lenses on surgery day.  You may not have dentures,                     Hearing aides, contacts or glasses in the operating room. These items can be                    Placed in the Recovery Room to receive immediately after surgery.  __x___ 9. IF YOU ARE SCHEDULED TO GO HOME ON THE SAME DAY, YOU MUST                   Have someone to drive you home and to stay with you  for the first 24 hours.                    Have an arrangement prior to arriving on surgery day.  ___x__ 10. Take the following medications  on the morning of surgery with a  sip of water:                              1.  TYLENOL, IF NEEDED                     2.   ALBUTEROL INHALER, please bring with you to the hospital                     3.   ZYRTEC                     4.                     5.                     6.  __X___ 11.  Follow any instructions provided to you by your surgeon.                        Such as enema, clear liquid bowel prep, CARB ENHANCEMENT DRINK.  (please have                          It completed within 3 hours of surgery)  __X__  12. STOP / ASPIRIN AS OF:  TODAY                       THIS INCLUDES BC POWDERS / GOODIES POWDER  __x___ 13. STOP Anti-inflammatories as of:  TODAY                      This includes IBUPROFEN / MOTRIN / ADVIL / ALEVE/ NAPROXYN                    YOU MAY TAKE TYLENOL ANY TIME PRIOR TO SURGERY.  _____ 14.  Stop supplements until after surgery.                     This includes: BIOTIN / OSCAL (CALCIUM) / CO Q 10 / MULTIVITS / FISH OIL / PROBIOTIC /                            VOLTAREN GEL   _ __X____17.  Continue to take the following medications but do not take on the morning of surgery:                        LASIX  AND POTASSIUM  ____X__18. If staying overnight, please have appropriate shoes to wear to be able to walk around the unit.                   Wear clean and comfortable clothing to the hospital.  MAKE SURE TO TAKE METOPROLOL AT NIGHT AS USUAL.  HAVE STOOL SOFTENERS AT HOME FOR AFTER SURGERY

## 2019-02-07 NOTE — Pre-Procedure Instructions (Signed)
Provided instructions for incentive spirometer for home use.  Reviewed total joint handbook and answered questions. Patient states understanding of all info provided.

## 2019-02-10 ENCOUNTER — Other Ambulatory Visit
Admission: RE | Admit: 2019-02-10 | Discharge: 2019-02-10 | Disposition: A | Discharge status: Home / Self Care | Payer: BLUE CROSS/BLUE SHIELD | Source: Ambulatory Visit | Attending: Orthopedic Surgery | Admitting: Orthopedic Surgery

## 2019-02-10 ENCOUNTER — Other Ambulatory Visit: Payer: Self-pay

## 2019-02-10 DIAGNOSIS — Z20828 Contact with and (suspected) exposure to other viral communicable diseases: Secondary | ICD-10-CM | POA: Diagnosis not present

## 2019-02-10 DIAGNOSIS — Z01812 Encounter for preprocedural laboratory examination: Secondary | ICD-10-CM | POA: Insufficient documentation

## 2019-02-10 LAB — SARS CORONAVIRUS 2 (TAT 6-24 HRS): SARS Coronavirus 2: NEGATIVE

## 2019-02-14 MED ORDER — CLINDAMYCIN PHOSPHATE 900 MG/50ML IV SOLN
900.0000 mg | INTRAVENOUS | Status: AC
  Administered 2019-02-15: 900 mg via INTRAVENOUS

## 2019-02-14 MED ORDER — TRANEXAMIC ACID-NACL 1000-0.7 MG/100ML-% IV SOLN
1000.0000 mg | INTRAVENOUS | Status: AC
  Administered 2019-02-15: 11:00:00 1000 mg via INTRAVENOUS
  Filled 2019-02-14: qty 100

## 2019-02-15 ENCOUNTER — Encounter: Payer: Self-pay | Admitting: *Deleted

## 2019-02-15 ENCOUNTER — Inpatient Hospital Stay: Payer: BLUE CROSS/BLUE SHIELD | Admitting: Anesthesiology

## 2019-02-15 ENCOUNTER — Inpatient Hospital Stay: Payer: BLUE CROSS/BLUE SHIELD

## 2019-02-15 ENCOUNTER — Encounter
Admission: RE | Disposition: A | Discharge status: Home Health | Payer: Self-pay | Source: Home / Self Care | Attending: Orthopedic Surgery

## 2019-02-15 ENCOUNTER — Other Ambulatory Visit: Payer: Self-pay

## 2019-02-15 ENCOUNTER — Inpatient Hospital Stay
Admission: RE | Admit: 2019-02-15 | Discharge: 2019-02-20 | DRG: 470 | Disposition: A | Discharge status: Home Health | Payer: BLUE CROSS/BLUE SHIELD | Attending: Orthopedic Surgery | Admitting: Orthopedic Surgery

## 2019-02-15 DIAGNOSIS — F17210 Nicotine dependence, cigarettes, uncomplicated: Secondary | ICD-10-CM | POA: Diagnosis present

## 2019-02-15 DIAGNOSIS — Z79891 Long term (current) use of opiate analgesic: Secondary | ICD-10-CM | POA: Diagnosis not present

## 2019-02-15 DIAGNOSIS — E538 Deficiency of other specified B group vitamins: Secondary | ICD-10-CM | POA: Diagnosis present

## 2019-02-15 DIAGNOSIS — Z880 Allergy status to penicillin: Secondary | ICD-10-CM

## 2019-02-15 DIAGNOSIS — Z888 Allergy status to other drugs, medicaments and biological substances status: Secondary | ICD-10-CM | POA: Diagnosis not present

## 2019-02-15 DIAGNOSIS — Z79899 Other long term (current) drug therapy: Secondary | ICD-10-CM

## 2019-02-15 DIAGNOSIS — E876 Hypokalemia: Secondary | ICD-10-CM | POA: Diagnosis not present

## 2019-02-15 DIAGNOSIS — Z91013 Allergy to seafood: Secondary | ICD-10-CM | POA: Diagnosis not present

## 2019-02-15 DIAGNOSIS — I10 Essential (primary) hypertension: Secondary | ICD-10-CM | POA: Diagnosis present

## 2019-02-15 DIAGNOSIS — Z419 Encounter for procedure for purposes other than remedying health state, unspecified: Secondary | ICD-10-CM

## 2019-02-15 DIAGNOSIS — J449 Chronic obstructive pulmonary disease, unspecified: Secondary | ICD-10-CM | POA: Diagnosis present

## 2019-02-15 DIAGNOSIS — F10239 Alcohol dependence with withdrawal, unspecified: Secondary | ICD-10-CM | POA: Diagnosis present

## 2019-02-15 DIAGNOSIS — E871 Hypo-osmolality and hyponatremia: Secondary | ICD-10-CM | POA: Diagnosis present

## 2019-02-15 DIAGNOSIS — M879 Osteonecrosis, unspecified: Secondary | ICD-10-CM | POA: Diagnosis present

## 2019-02-15 DIAGNOSIS — R000 Tachycardia, unspecified: Secondary | ICD-10-CM | POA: Diagnosis present

## 2019-02-15 DIAGNOSIS — Z885 Allergy status to narcotic agent status: Secondary | ICD-10-CM | POA: Diagnosis not present

## 2019-02-15 DIAGNOSIS — Z7901 Long term (current) use of anticoagulants: Secondary | ICD-10-CM

## 2019-02-15 DIAGNOSIS — Z01812 Encounter for preprocedural laboratory examination: Secondary | ICD-10-CM

## 2019-02-15 HISTORY — PX: TOTAL HIP ARTHROPLASTY: SHX124

## 2019-02-15 LAB — POCT I-STAT, CHEM 8
BUN: <3 mg/dL — ABNORMAL LOW (ref 6–20)
Calcium, Ion: 1.01 mmol/L — ABNORMAL LOW (ref 1.15–1.40)
Chloride: 98 mmol/L (ref 98–111)
Creatinine, Ser: 0.3 mg/dL — ABNORMAL LOW (ref 0.44–1.00)
Glucose, Bld: 257 mg/dL — ABNORMAL HIGH (ref 70–99)
HCT: 40 % (ref 36.0–46.0)
Hemoglobin: 13.6 g/dL (ref 12.0–15.0)
Potassium: 3.3 mmol/L — ABNORMAL LOW (ref 3.5–5.1)
Sodium: 138 mmol/L (ref 135–145)
TCO2: 23 mmol/L (ref 22–32)

## 2019-02-15 LAB — ABO/RH: ABO/RH(D): O POS

## 2019-02-15 SURGERY — ARTHROPLASTY, HIP, TOTAL, ANTERIOR APPROACH
Anesthesia: Spinal | Site: Hip | Laterality: Right

## 2019-02-15 MED ORDER — RIVAROXABAN 10 MG PO TABS
10.0000 mg | ORAL_TABLET | Freq: Every day | ORAL | Status: DC
  Administered 2019-02-16 – 2019-02-20 (×5): 10 mg via ORAL
  Filled 2019-02-15 (×5): qty 1

## 2019-02-15 MED ORDER — CLINDAMYCIN PHOSPHATE 900 MG/50ML IV SOLN
INTRAVENOUS | Status: AC
  Filled 2019-02-15: qty 50

## 2019-02-15 MED ORDER — ALBUTEROL SULFATE (2.5 MG/3ML) 0.083% IN NEBU
2.5000 mg | INHALATION_SOLUTION | Freq: Once | RESPIRATORY_TRACT | Status: AC
  Administered 2019-02-15: 09:00:00 2.5 mg via RESPIRATORY_TRACT
  Filled 2019-02-15: qty 3

## 2019-02-15 MED ORDER — MAGNESIUM HYDROXIDE 400 MG/5ML PO SUSP: 30.0000 mL | Freq: Every day | ORAL | Status: DC | PRN

## 2019-02-15 MED ORDER — MENTHOL 3 MG MT LOZG
1.0000 | LOZENGE | OROMUCOSAL | Status: DC | PRN
  Filled 2019-02-15: qty 9

## 2019-02-15 MED ORDER — IPRATROPIUM-ALBUTEROL 0.5-2.5 (3) MG/3ML IN SOLN
RESPIRATORY_TRACT | Status: AC
  Filled 2019-02-15: qty 3

## 2019-02-15 MED ORDER — MIDAZOLAM HCL 5 MG/5ML IJ SOLN
INTRAMUSCULAR | Status: DC | PRN
  Administered 2019-02-15: 1 mg via INTRAVENOUS
  Administered 2019-02-15 (×2): 2 mg via INTRAVENOUS

## 2019-02-15 MED ORDER — ACETAMINOPHEN NICU IV SYRINGE 10 MG/ML
INTRAVENOUS | Status: AC
  Filled 2019-02-15: qty 1

## 2019-02-15 MED ORDER — SODIUM CHLORIDE FLUSH 0.9 % IV SOLN
INTRAVENOUS | Status: AC
  Filled 2019-02-15: qty 20

## 2019-02-15 MED ORDER — BUPIVACAINE HCL (PF) 0.5 % IJ SOLN
INTRAMUSCULAR | Status: DC | PRN
  Administered 2019-02-15: 3 mL

## 2019-02-15 MED ORDER — METOPROLOL TARTRATE 50 MG PO TABS
100.0000 mg | ORAL_TABLET | Freq: Every evening | ORAL | Status: DC
  Administered 2019-02-15 – 2019-02-18 (×3): 100 mg via ORAL
  Filled 2019-02-15 (×4): qty 2

## 2019-02-15 MED ORDER — BACITRACIN 50000 UNITS IM SOLR
INTRAMUSCULAR | Status: AC
  Filled 2019-02-15: qty 2

## 2019-02-15 MED ORDER — ACETAMINOPHEN 500 MG PO TABS
500.0000 mg | ORAL_TABLET | Freq: Four times a day (QID) | ORAL | Status: AC
  Administered 2019-02-15 – 2019-02-16 (×4): 500 mg via ORAL
  Filled 2019-02-15 (×4): qty 1

## 2019-02-15 MED ORDER — ONDANSETRON HCL 4 MG PO TABS: 4.0000 mg | ORAL_TABLET | Freq: Four times a day (QID) | ORAL | Status: DC | PRN

## 2019-02-15 MED ORDER — EPINEPHRINE PF 1 MG/ML IJ SOLN
INTRAMUSCULAR | Status: AC
  Filled 2019-02-15: qty 1

## 2019-02-15 MED ORDER — FENTANYL CITRATE (PF) 100 MCG/2ML IJ SOLN: 25.0000 ug | INTRAMUSCULAR | Status: DC | PRN

## 2019-02-15 MED ORDER — ONDANSETRON HCL 4 MG/2ML IJ SOLN: 4.0000 mg | Freq: Four times a day (QID) | INTRAMUSCULAR | Status: DC | PRN

## 2019-02-15 MED ORDER — BUPIVACAINE-EPINEPHRINE (PF) 0.25% -1:200000 IJ SOLN
INTRAMUSCULAR | Status: DC | PRN
  Administered 2019-02-15: 20 mL

## 2019-02-15 MED ORDER — HYDROCODONE-ACETAMINOPHEN 7.5-325 MG PO TABS: 1.0000 | ORAL_TABLET | ORAL | Status: DC | PRN

## 2019-02-15 MED ORDER — ACETAMINOPHEN 325 MG PO TABS: 325.0000 mg | ORAL_TABLET | Freq: Four times a day (QID) | ORAL | Status: DC | PRN

## 2019-02-15 MED ORDER — LACTATED RINGERS IV SOLN: INTRAVENOUS | Status: DC

## 2019-02-15 MED ORDER — ONDANSETRON HCL 4 MG/2ML IJ SOLN: 4.0000 mg | Freq: Once | INTRAMUSCULAR | Status: DC | PRN

## 2019-02-15 MED ORDER — BUPIVACAINE HCL (PF) 0.25 % IJ SOLN
INTRAMUSCULAR | Status: AC
  Filled 2019-02-15: qty 30

## 2019-02-15 MED ORDER — LACTATED RINGERS IV SOLN
INTRAVENOUS | Status: DC
  Administered 2019-02-15: 10:00:00 via INTRAVENOUS

## 2019-02-15 MED ORDER — METOCLOPRAMIDE HCL 10 MG PO TABS
5.0000 mg | ORAL_TABLET | Freq: Three times a day (TID) | ORAL | Status: DC | PRN
  Filled 2019-02-15: qty 1

## 2019-02-15 MED ORDER — METOCLOPRAMIDE HCL 5 MG/ML IJ SOLN: 5.0000 mg | Freq: Three times a day (TID) | INTRAMUSCULAR | Status: DC | PRN

## 2019-02-15 MED ORDER — KETOROLAC TROMETHAMINE 15 MG/ML IJ SOLN
INTRAMUSCULAR | Status: AC
  Administered 2019-02-15: 14:00:00
  Filled 2019-02-15: qty 1

## 2019-02-15 MED ORDER — FENTANYL CITRATE (PF) 100 MCG/2ML IJ SOLN
INTRAMUSCULAR | Status: AC
  Filled 2019-02-15: qty 2

## 2019-02-15 MED ORDER — FENTANYL CITRATE (PF) 100 MCG/2ML IJ SOLN
INTRAMUSCULAR | Status: DC | PRN
  Administered 2019-02-15: 25 ug via INTRAVENOUS
  Administered 2019-02-15: 50 ug via INTRAVENOUS
  Administered 2019-02-15: 25 ug via INTRAVENOUS

## 2019-02-15 MED ORDER — PHENOL 1.4 % MT LIQD
1.0000 | OROMUCOSAL | Status: DC | PRN
  Filled 2019-02-15: qty 177

## 2019-02-15 MED ORDER — MAGNESIUM CITRATE PO SOLN
1.0000 | Freq: Once | ORAL | Status: DC | PRN
  Filled 2019-02-15: qty 296

## 2019-02-15 MED ORDER — ALBUTEROL SULFATE (2.5 MG/3ML) 0.083% IN NEBU: 2.5000 mg | INHALATION_SOLUTION | Freq: Four times a day (QID) | RESPIRATORY_TRACT | Status: DC | PRN

## 2019-02-15 MED ORDER — METOPROLOL TARTRATE 50 MG PO TABS
100.0000 mg | ORAL_TABLET | Freq: Once | ORAL | Status: AC
  Administered 2019-02-15: 100 mg via ORAL

## 2019-02-15 MED ORDER — ACETAMINOPHEN 10 MG/ML IV SOLN
INTRAVENOUS | Status: DC | PRN
  Administered 2019-02-15: 1000 mg via INTRAVENOUS

## 2019-02-15 MED ORDER — PHENYLEPHRINE HCL (PRESSORS) 10 MG/ML IV SOLN
INTRAVENOUS | Status: DC | PRN
  Administered 2019-02-15 (×6): 100 ug via INTRAVENOUS

## 2019-02-15 MED ORDER — SODIUM CHLORIDE 0.9 % IR SOLN
Status: DC | PRN
  Administered 2019-02-15: 2000 mL

## 2019-02-15 MED ORDER — MIDAZOLAM HCL 5 MG/5ML IJ SOLN
INTRAMUSCULAR | Status: AC
  Filled 2019-02-15: qty 5

## 2019-02-15 MED ORDER — METOPROLOL TARTRATE 50 MG PO TABS
ORAL_TABLET | ORAL | Status: AC
  Administered 2019-02-15: 10:00:00
  Filled 2019-02-15: qty 2

## 2019-02-15 MED ORDER — ALUM & MAG HYDROXIDE-SIMETH 200-200-20 MG/5ML PO SUSP: 30.0000 mL | ORAL | Status: DC | PRN

## 2019-02-15 MED ORDER — FUROSEMIDE 20 MG PO TABS
20.0000 mg | ORAL_TABLET | Freq: Every day | ORAL | Status: DC
  Administered 2019-02-15 – 2019-02-18 (×3): 20 mg via ORAL
  Filled 2019-02-15 (×3): qty 1

## 2019-02-15 MED ORDER — CLINDAMYCIN PHOSPHATE 600 MG/50ML IV SOLN
600.0000 mg | Freq: Four times a day (QID) | INTRAVENOUS | Status: AC
  Administered 2019-02-15 (×2): 600 mg via INTRAVENOUS
  Filled 2019-02-15 (×2): qty 50

## 2019-02-15 MED ORDER — KETOROLAC TROMETHAMINE 15 MG/ML IJ SOLN
15.0000 mg | Freq: Four times a day (QID) | INTRAMUSCULAR | Status: AC
  Administered 2019-02-15 – 2019-02-16 (×4): 15 mg via INTRAVENOUS
  Filled 2019-02-15 (×3): qty 1

## 2019-02-15 MED ORDER — ALBUTEROL SULFATE (2.5 MG/3ML) 0.083% IN NEBU
INHALATION_SOLUTION | RESPIRATORY_TRACT | Status: AC
  Administered 2019-02-15: 2.5 mg via RESPIRATORY_TRACT
  Filled 2019-02-15: qty 3

## 2019-02-15 MED ORDER — PROPOFOL 500 MG/50ML IV EMUL
INTRAVENOUS | Status: DC | PRN
  Administered 2019-02-15: 50 ug/kg/min via INTRAVENOUS

## 2019-02-15 MED ORDER — LORATADINE 10 MG PO TABS
10.0000 mg | ORAL_TABLET | Freq: Every day | ORAL | Status: DC
  Administered 2019-02-15 – 2019-02-20 (×6): 10 mg via ORAL
  Filled 2019-02-15 (×6): qty 1

## 2019-02-15 MED ORDER — METHOCARBAMOL 1000 MG/10ML IJ SOLN
500.0000 mg | Freq: Four times a day (QID) | INTRAVENOUS | Status: DC | PRN
  Filled 2019-02-15 (×2): qty 5

## 2019-02-15 MED ORDER — PROPOFOL 500 MG/50ML IV EMUL
INTRAVENOUS | Status: AC
  Filled 2019-02-15: qty 50

## 2019-02-15 MED ORDER — HYDROCODONE-ACETAMINOPHEN 5-325 MG PO TABS
1.0000 | ORAL_TABLET | ORAL | Status: DC | PRN
  Administered 2019-02-15 – 2019-02-16 (×2): 2 via ORAL
  Administered 2019-02-16: 1 via ORAL
  Administered 2019-02-17 – 2019-02-18 (×2): 2 via ORAL
  Administered 2019-02-18: 1 via ORAL
  Administered 2019-02-19: 2 via ORAL
  Administered 2019-02-20: 1 via ORAL
  Filled 2019-02-15 (×3): qty 2
  Filled 2019-02-15: qty 1
  Filled 2019-02-15 (×2): qty 2
  Filled 2019-02-15 (×2): qty 1

## 2019-02-15 MED ORDER — METHOCARBAMOL 500 MG PO TABS
500.0000 mg | ORAL_TABLET | Freq: Four times a day (QID) | ORAL | Status: DC | PRN
  Administered 2019-02-18: 500 mg via ORAL
  Filled 2019-02-15 (×2): qty 1

## 2019-02-15 MED ORDER — POVIDONE-IODINE 10 % EX SWAB
2.0000 "application " | Freq: Once | CUTANEOUS | Status: DC
  Administered 2019-02-15: 2 via TOPICAL

## 2019-02-15 MED ORDER — BISACODYL 10 MG RE SUPP: 10.0000 mg | Freq: Every day | RECTAL | Status: DC | PRN

## 2019-02-15 MED ORDER — DOCUSATE SODIUM 100 MG PO CAPS
100.0000 mg | ORAL_CAPSULE | Freq: Two times a day (BID) | ORAL | Status: DC
  Administered 2019-02-15 – 2019-02-19 (×8): 100 mg via ORAL
  Filled 2019-02-15 (×9): qty 1

## 2019-02-15 MED ORDER — FAMOTIDINE 20 MG PO TABS
ORAL_TABLET | ORAL | Status: AC
  Administered 2019-02-15: 20 mg
  Filled 2019-02-15: qty 1

## 2019-02-15 MED ORDER — LACTATED RINGERS IV SOLN
INTRAVENOUS | Status: DC
  Administered 2019-02-15 – 2019-02-18 (×2): via INTRAVENOUS

## 2019-02-15 MED ORDER — MORPHINE SULFATE (PF) 2 MG/ML IV SOLN
0.5000 mg | INTRAVENOUS | Status: DC | PRN
  Administered 2019-02-17: 1 mg via INTRAVENOUS
  Filled 2019-02-15: qty 1

## 2019-02-15 SURGICAL SUPPLY — 48 items
BLADE SAGITTAL WIDE XTHICK NO (BLADE) ×3 IMPLANT
BRUSH SCRUB EZ  4% CHG (MISCELLANEOUS) ×4
BRUSH SCRUB EZ 4% CHG (MISCELLANEOUS) ×2 IMPLANT
CHLORAPREP W/TINT 26 (MISCELLANEOUS) ×3 IMPLANT
COVER HOLE (Hips) ×3 IMPLANT
COVER WAND RF STERILE (DRAPES) ×3 IMPLANT
DRAPE 3/4 80X56 (DRAPES) ×3 IMPLANT
DRAPE C-ARM 42X72 X-RAY (DRAPES) ×3 IMPLANT
DRAPE STERI IOBAN 125X83 (DRAPES) IMPLANT
DRSG AQUACEL AG ADV 3.5X10 (GAUZE/BANDAGES/DRESSINGS) ×3 IMPLANT
DRSG AQUACEL AG ADV 3.5X14 (GAUZE/BANDAGES/DRESSINGS) IMPLANT
ELECT BLADE 6.5 EXT (BLADE) ×3 IMPLANT
ELECT REM PT RETURN 9FT ADLT (ELECTROSURGICAL) ×3
ELECTRODE REM PT RTRN 9FT ADLT (ELECTROSURGICAL) ×1 IMPLANT
GAUZE XEROFORM 1X8 LF (GAUZE/BANDAGES/DRESSINGS) IMPLANT
GLOVE INDICATOR 8.0 STRL GRN (GLOVE) ×3 IMPLANT
GLOVE SURG ORTHO 8.0 STRL STRW (GLOVE) ×6 IMPLANT
GOWN STRL REUS W/ TWL LRG LVL3 (GOWN DISPOSABLE) ×1 IMPLANT
GOWN STRL REUS W/ TWL XL LVL3 (GOWN DISPOSABLE) ×1 IMPLANT
GOWN STRL REUS W/TWL LRG LVL3 (GOWN DISPOSABLE) ×2
GOWN STRL REUS W/TWL XL LVL3 (GOWN DISPOSABLE) ×2
HEAD OXINIUM PLUS 0 32MM (Hips) ×3 IMPLANT
HOOD PEEL AWAY FLYTE STAYCOOL (MISCELLANEOUS) ×9 IMPLANT
IV NS 1000ML (IV SOLUTION) ×2
IV NS 1000ML BAXH (IV SOLUTION) ×1 IMPLANT
KIT PATIENT CARE HANA TABLE (KITS) ×3 IMPLANT
KIT TURNOVER CYSTO (KITS) ×3 IMPLANT
LINER 3H HEMI SHELL 48MM (Liner) ×3 IMPLANT
LINER ACETABULAR 32X48 (Liner) ×3 IMPLANT
MAT ABSORB  FLUID 56X50 GRAY (MISCELLANEOUS) ×2
MAT ABSORB FLUID 56X50 GRAY (MISCELLANEOUS) ×1 IMPLANT
NDL SAFETY ECLIPSE 18X1.5 (NEEDLE) ×2 IMPLANT
NEEDLE HYPO 18GX1.5 SHARP (NEEDLE) ×4
NEEDLE HYPO 22GX1.5 SAFETY (NEEDLE) ×3 IMPLANT
NEEDLE SPNL 20GX3.5 QUINCKE YW (NEEDLE) ×3 IMPLANT
PACK HIP PROSTHESIS (MISCELLANEOUS) ×3 IMPLANT
PADDING CAST BLEND 4X4 NS (MISCELLANEOUS) ×6 IMPLANT
PILLOW ABDUCTION MEDIUM (MISCELLANEOUS) ×3 IMPLANT
PULSAVAC PLUS IRRIG FAN TIP (DISPOSABLE) ×3
SCREW 6.5X25MM (Screw) ×3 IMPLANT
STAPLER SKIN PROX 35W (STAPLE) ×3 IMPLANT
STEM STD COLLAR SZ3 POLARSTEM (Stem) ×3 IMPLANT
SUT BONE WAX W31G (SUTURE) ×3 IMPLANT
SUT DVC 2 QUILL PDO  T11 36X36 (SUTURE) ×2
SUT DVC 2 QUILL PDO T11 36X36 (SUTURE) ×1 IMPLANT
SUT VIC AB 2-0 CT1 18 (SUTURE) ×3 IMPLANT
SYR 20ML LL LF (SYRINGE) ×3 IMPLANT
TIP FAN IRRIG PULSAVAC PLUS (DISPOSABLE) ×1 IMPLANT

## 2019-02-15 NOTE — H&P (Signed)
The patient has been re-examined, and the chart reviewed, and there have been no interval changes to the documented history and physical.  Plan a right total hip replacement today.  Anesthesia is not consulted regarding a peripheral nerve block for post-operative pain.  The risks, benefits, and alternatives have been discussed at length, and the patient is willing to proceed.     

## 2019-02-15 NOTE — Op Note (Signed)
02/15/2019  12:52 PM  PATIENT:  Brenda [name]   MRN: [id number]  PRE-OPERATIVE DIAGNOSIS:  Osteonecrosis right hip   POST-OPERATIVE DIAGNOSIS: Same  Procedure: Right Total Hip Replacement  Surgeon: Elyn Aquas. Harlow Mares, MD   Assist: Carlynn Spry, PA-C  Anesthesia: Spinal   EBL: 200 mL   Specimens: None   Drains: None   Components used: A size 3 Polarstem Smith [employer]hew, R3 size 48 mm shell, and a 32 mm by +0 mm head    Description of the procedure in detail: After informed consent was obtained and the appropriate extremity marked in the pre-operative holding area, the patient was taken to the operating room and placed in the supine position on the fracture table. All pressure points were well padded and bilateral lower extremities were place in traction spars. The hip was prepped and draped in standard sterile fashion. A spinal anesthetic had been delivered by the anesthesia team. The skin and subcutaneous tissues were injected with a mixture of Marcaine with epinephrine for post-operative pain. A longitudinal incision approximately 10 cm in length was carried out from the anterior superior iliac spine to the greater trochanter. The tensor fascia was divided and blunt dissection was taken down to the level of the joint capsule. The lateral circumflex vessels were cauterized. Deep retractors were placed and a portion of the anterior capsule was excised. Using fluoroscopy the neck cut was planned and carried out with a sagittal saw. The head was passed from the field with use of a corkscrew and hip skid. Deep retractors were placed along the acetabulum and the degenerative labrum and large osteophytes were removed with a Rongeur. The cup was sequentially reamed to a size 48 mm. The wound was irrigated and using fluoroscopy the size  mm cup was impacted in to anatomic position. A single screw was placed followed by a threaded hole cover. The final liner was impacted in to position. Attention was  then turned to the proximal femur. The leg was placed in extension and external rotation. The canal was opened and sequentially broached to a size 3.. The trial components were placed and the hip relocated. The components were found to be in good position using fluoroscopy. The hip was dislocated and the trial components removed. The final components were impacted in to position and the hip relocated. The final components were again check with fluoroscopy and found to be in good position. Hemostasis was achieved with electrocautery. The deep capsule was injected with Marcaine and epinephrine. The wound was irrigated with bacitracin laced normal saline and the tensor fascia closed with #2 Quill suture. The subcutaneous tissues were closed with 2-0 vicryl and staples for the skin. A sterile dressing was applied and an abduction pillow. Patient tolerated the procedure well and there were no apparent complication. Patient was taken to the recovery room in good condition.   Kurtis Bushman, MD

## 2019-02-15 NOTE — Evaluation (Signed)
Physical Therapy Evaluation Patient Details Name: Brenda [name] MRN: [id number] DOB: 1960-05-22 Today's Date: 02/15/2019   History of Present Illness  [name] diagnosed with osteonecrosis right hip and is s/p elective R THA.  PMH includes COPD, asthma, HTN, and CHF.    Clinical Impression  Pt presented with deficits in strength, transfers, mobility, gait, balance, and activity tolerance but overall performed well considering POD#0.  Pt was SBA with sup to sit with significantly increased time and effort to complete the task along with cues for sequencing but did not require physical assist.  Pt required Min A with both transfers and with limited amb from the bed to the chair to prevent posterior LOB. Pt will benefit from HHPT services upon discharge to safely address above deficits for decreased caregiver assistance and eventual return to PLOF.      Follow Up Recommendations Home health PT;Supervision for mobility/OOB    Equipment Recommendations  None recommended by PT;Other (comment)(Pt educated on tub transfer bench, need to confirm if pt would like one)    Recommendations for Other Services       Precautions / Restrictions Precautions Precautions: Anterior Hip Precaution Booklet Issued: No Restrictions Weight Bearing Restrictions: Yes RLE Weight Bearing: Weight bearing as tolerated      Mobility  Bed Mobility Overal bed mobility: Needs Assistance Bed Mobility: Supine to Sit     Supine to sit: Supervision     General bed mobility comments: Min verbal cues for sequencing with significant time and effort required but no physical assistance needed  Transfers Overall transfer level: Needs assistance Equipment used: Rolling walker (2 wheeled) Transfers: Sit to/from Stand Sit to Stand: Min guard         General transfer comment: Min A for stability upon initial stand to prevent posterior LOB  Ambulation/Gait Ambulation/Gait assistance: Min assist Gait  Distance (Feet): 3 Feet Assistive device: Rolling walker (2 wheeled) Gait Pattern/deviations: Step-to pattern;Decreased step length - right;Decreased step length - left;Antalgic;Trunk flexed Gait velocity: decreased   General Gait Details: Min A for stability from EOB to chair with heavy BUE lean on the RW  Stairs            Wheelchair Mobility    Modified Rankin (Stroke Patients Only)       Balance Overall balance assessment: Needs assistance   Sitting balance-Leahy Scale: Good     Standing balance support: Bilateral upper extremity supported;During functional activity Standing balance-Leahy Scale: Poor Standing balance comment: Min A for stability in standing                             Pertinent Vitals/Pain Pain Assessment: 0-10 Pain Score: 5  Pain Location: R hip Pain Descriptors / Indicators: Sharp Pain Intervention(s): Monitored during session;Patient requesting pain meds-RN notified    Home Living Family/patient expects to be discharged to:: Private residence Living Arrangements: Alone Available Help at Discharge: Family;Available 24 hours/day Type of Home: Apartment Home Access: Level entry     Home Layout: One level Home Equipment: Bedside commode;Walker - 2 wheels;Cane - quad Additional Comments: Pt's sister is staying with the patient 24/7 as long as needed    Prior Function Level of Independence: Independent with assistive device(s)         Comments: Mod Ind amb with a RW for R hip pain control, works PT as Scientist, water quality at [employer], Ind with ADLs, no fall history  Hand Dominance        Extremity/Trunk Assessment        Lower Extremity Assessment Lower Extremity Assessment: Generalized weakness;RLE deficits/detail RLE Deficits / Details: R ankle PF/DF strength WFL, sensation to light touch intact to BLEs RLE: Unable to fully assess due to pain RLE Sensation: WNL       Communication   Communication: No  difficulties  Cognition Arousal/Alertness: Awake/alert Behavior During Therapy: WFL for tasks assessed/performed Overall Cognitive Status: Within Functional Limits for tasks assessed                                        General Comments      Exercises Total Joint Exercises Ankle Circles/Pumps: AROM;Strengthening;Both;10 reps Quad Sets: Strengthening;Both;10 reps Gluteal Sets: Strengthening;Both;10 reps Hip ABduction/ADduction: AAROM;AROM;Both;5 reps Straight Leg Raises: AROM;AAROM;5 reps;Both Long Arc Quad: Strengthening;Both;10 reps Knee Flexion: Strengthening;Both;10 reps Marching in Standing: AROM;Both;10 reps;Standing Other Exercises Other Exercises: Sister in room with pt and sister educated on proper sequencing for car transfers with visual demonstration provided with room chair to simulate car seat Other Exercises: HEP education for BLE APs, QS, and GS x 10 each 5-6x/day as tolerated   Assessment/Plan    PT Assessment Patient needs continued PT services  PT Problem List Decreased strength;Decreased activity tolerance;Decreased balance;Decreased mobility;Decreased knowledge of use of DME       PT Treatment Interventions DME instruction;Gait training;Stair training;Functional mobility training;Therapeutic activities;Therapeutic exercise;Balance training;Patient/family education    PT Goals (Current goals can be found in the Care Plan section)  Acute Rehab PT Goals Patient Stated Goal: To get my life back and to get back to work PT Goal Formulation: With patient Time For Goal Achievement: 02/28/19 Potential to Achieve Goals: Good    Frequency BID   Barriers to discharge        Co-evaluation               AM-PAC PT "6 Clicks" Mobility  Outcome Measure Help needed turning from your back to your side while in a flat bed without using bedrails?: A Little Help needed moving from lying on your back to sitting on the side of a flat bed without  using bedrails?: A Little Help needed moving to and from a bed to a chair (including a wheelchair)?: A Little Help needed standing up from a chair using your arms (e.g., wheelchair or bedside chair)?: A Little Help needed to walk in hospital room?: A Lot Help needed climbing 3-5 steps with a railing? : A Lot 6 Click Score: 16    End of Session Equipment Utilized During Treatment: Gait belt Activity Tolerance: Patient tolerated treatment well Patient left: in chair;with call bell/phone within reach;with chair alarm set;with SCD's reapplied;with family/visitor present;Other (comment)(ice to R hip) Nurse Communication: Mobility status;Patient requests pain meds PT Visit Diagnosis: Unsteadiness on feet (R26.81);Muscle weakness (generalized) (M62.81);Other abnormalities of gait and mobility (R26.89)    Time: [id number] PT Time Calculation (min) (ACUTE ONLY): 45 min   Charges:   PT Evaluation $PT Eval Moderate Complexity: 1 Mod PT Treatments $Therapeutic Exercise: 8-22 mins $Therapeutic Activity: 8-22 mins        D. Scott Iona Stay PT, DPT 02/15/19, 5:20 PM

## 2019-02-15 NOTE — Anesthesia Post-op Follow-up Note (Signed)
Anesthesia QCDR form completed.        

## 2019-02-15 NOTE — Transfer of Care (Signed)
Immediate Anesthesia Transfer of Care Note  Patient: Brenda [name]  Procedure(s) Performed: TOTAL HIP ARTHROPLASTY ANTERIOR APPROACH (Right Hip)  Patient Location: PACU  Anesthesia Type:Spinal  Level of Consciousness: awake, alert  and oriented  Airway & Oxygen Therapy: Patient Spontanous Breathing  Post-op Assessment: Report given to RN, Post -op Vital signs reviewed and stable and Patient moving all extremities  Post vital signs: Reviewed and stable  Last Vitals:  Vitals Value Taken Time  BP 124/97 02/15/19 1258  Temp    Pulse 68 02/15/19 1258  Resp 15 02/15/19 1258  SpO2 96 % 02/15/19 1258  Vitals shown include unvalidated device data.  Last Pain:  Vitals:   02/15/19 0956  TempSrc: Temporal  PainSc: 6       Patients Stated Pain Goal: 0 ([id number])  Complications: No apparent anesthesia complications

## 2019-02-15 NOTE — Anesthesia Procedure Notes (Signed)
Spinal  Patient location during procedure: OR Start time: 02/15/2019 11:00 AM End time: 02/15/2019 11:07 AM Staffing Performed: anesthesiologist  Preanesthetic Checklist Completed: patient identified, site marked, surgical consent, pre-op evaluation, timeout performed, IV checked, risks and benefits discussed and monitors and equipment checked Spinal Block Patient position: sitting Prep: Betadine Patient monitoring: heart rate, continuous pulse ox, blood pressure and cardiac monitor Approach: midline Location: L4-5 Injection technique: single-shot Needle Needle type: Whitacre, Introducer and Sprotte  Needle gauge: 24 G Needle length: 9 cm Additional Notes Negative paresthesia. Negative blood return. Positive free-flowing CSF. Expiration date of kit checked and confirmed. Patient tolerated procedure well, without complications.

## 2019-02-15 NOTE — Anesthesia Procedure Notes (Signed)
Anesthesia Procedure Note     

## 2019-02-15 NOTE — Anesthesia Preprocedure Evaluation (Signed)
Anesthesia Evaluation  Patient identified by MRN, date of birth, ID band Patient awake    Reviewed: Allergy & Precautions, NPO status , Patient's Chart, lab work & pertinent test results  Airway Mallampati: III       Dental   Pulmonary asthma , neg sleep apnea, COPD,  COPD inhaler, Current Smoker and Patient abstained from smoking.,           Cardiovascular hypertension, Pt. on medications +CHF (diastolic dysfunction)  (-) Past MI (-) dysrhythmias (-) Valvular Problems/Murmurs     Neuro/Psych neg Seizures    GI/Hepatic Neg liver ROS, neg GERD  ,  Endo/Other  neg diabetes  Renal/GU negative Renal ROS     Musculoskeletal   Abdominal   Peds  Hematology  (+) anemia ,   Anesthesia Other Findings   Reproductive/Obstetrics                             Anesthesia Physical Anesthesia Plan  ASA: III  Anesthesia Plan: Spinal   Post-op Pain Management:    Induction:   PONV Risk Score and Plan:   Airway Management Planned:   Additional Equipment:   Intra-op Plan:   Post-operative Plan:   Informed Consent: I have reviewed the patients History and Physical, chart, labs and discussed the procedure including the risks, benefits and alternatives for the proposed anesthesia with the patient or authorized representative who has indicated his/her understanding and acceptance.       Plan Discussed with:   Anesthesia Plan Comments:         Anesthesia Quick Evaluation

## 2019-02-16 LAB — BASIC METABOLIC PANEL
Anion gap: 11 (ref 5–15)
BUN: 5 mg/dL — ABNORMAL LOW (ref 6–20)
CO2: 33 mmol/L — ABNORMAL HIGH (ref 22–32)
Calcium: 8.3 mg/dL — ABNORMAL LOW (ref 8.9–10.3)
Chloride: 92 mmol/L — ABNORMAL LOW (ref 98–111)
Creatinine, Ser: 0.37 mg/dL — ABNORMAL LOW (ref 0.44–1.00)
GFR calc Af Amer: >60 mL/min (ref >60)
GFR calc non Af Amer: >60 mL/min (ref >60)
Glucose, Bld: 123 mg/dL — ABNORMAL HIGH (ref 70–99)
Potassium: 3.7 mmol/L (ref 3.5–5.1)
Sodium: 136 mmol/L (ref 135–145)

## 2019-02-16 LAB — CBC
HCT: 33.6 % — ABNORMAL LOW (ref 36.0–46.0)
Hemoglobin: 11.2 g/dL — ABNORMAL LOW (ref 12.0–15.0)
MCH: 34.4 pg — ABNORMAL HIGH (ref 26.0–34.0)
MCHC: 33.3 g/dL (ref 30.0–36.0)
MCV: 103.1 fL — ABNORMAL HIGH (ref 80.0–100.0)
Platelets: 91 10*3/uL — ABNORMAL LOW (ref 150–400)
RBC: 3.26 MIL/uL — ABNORMAL LOW (ref 3.87–5.11)
RDW: 12.2 % (ref 11.5–15.5)
WBC: 4.5 10*3/uL (ref 4.0–10.5)
nRBC: 0 % (ref 0.0–0.2)

## 2019-02-16 LAB — SURGICAL PATHOLOGY

## 2019-02-16 NOTE — Progress Notes (Signed)
Pt attempting to xfer self had to be redirected several times . Educated on importance of call bell , verbalized understanding

## 2019-02-16 NOTE — Progress Notes (Signed)
  Subjective:  Patient reports pain as mild.    Objective:   VITALS:   Vitals:   02/16/19 0359 02/16/19 0401 02/16/19 0737 02/16/19 1558  BP: (!) 138/99 124/88 (!) 140/93 123/90  Pulse: 77 75 84 90  Resp: 20  16   Temp: 98.4 F (36.9 C)  98.6 F (37 C) 98.7 F (37.1 C)  TempSrc: Oral   Oral  SpO2: 97%  98% 97%  Weight:      Height:        PHYSICAL EXAM:  Sensation intact distally Dorsiflexion/Plantar flexion intact Incision: scant drainage Compartment soft  LABS  Results for orders placed or performed during the hospital encounter of 02/15/19 (from the past 24 hour(s))  CBC     Status: Abnormal   Collection Time: 02/16/19  6:29 AM  Result Value Ref Range   WBC 4.5 4.0 - 10.5 K/uL   RBC 3.26 (L) 3.87 - 5.11 MIL/uL   Hemoglobin 11.2 (L) 12.0 - 15.0 g/dL   HCT 33.6 (L) 36.0 - 46.0 %   MCV 103.1 (H) 80.0 - 100.0 fL   MCH 34.4 (H) 26.0 - 34.0 pg   MCHC 33.3 30.0 - 36.0 g/dL   RDW 12.2 11.5 - 15.5 %   Platelets 91 (L) 150 - 400 K/uL   nRBC 0.0 0.0 - 0.2 %  Basic metabolic panel     Status: Abnormal   Collection Time: 02/16/19  6:29 AM  Result Value Ref Range   Sodium 136 135 - 145 mmol/L   Potassium 3.7 3.5 - 5.1 mmol/L   Chloride 92 (L) 98 - 111 mmol/L   CO2 33 (H) 22 - 32 mmol/L   Glucose, Bld 123 (H) 70 - 99 mg/dL   BUN 5 (L) 6 - 20 mg/dL   Creatinine, Ser 0.37 (L) 0.44 - 1.00 mg/dL   Calcium 8.3 (L) 8.9 - 10.3 mg/dL   GFR calc non Af Amer >60 >60 mL/min   GFR calc Af Amer >60 >60 mL/min   Anion gap 11 5 - 15    X-ray Chest Pa Or Ap  Result Date: 02/15/2019 CLINICAL DATA:  Pre-procedure.  Shortness of breath EXAM: CHEST  1 VIEW COMPARISON:  01/20/2019 FINDINGS: Minimal bibasilar atelectasis. Heart is normal size. No effusions. No acute bony abnormality. IMPRESSION: Minimal bibasilar atelectasis. Electronically Signed   By: Rolm Baptise M.D.   On: 02/15/2019 09:36   Dg Hip Operative Unilat W Or W/o Pelvis Right  Result Date: 02/15/2019 CLINICAL DATA:   Hip replacement EXAM: OPERATIVE RIGHT HIP (WITH PELVIS IF PERFORMED) 2 VIEWS TECHNIQUE: Fluoroscopic spot image(s) were submitted for interpretation post-operatively. COMPARISON:  December 18, 2018 FINDINGS: The patient has undergone total hip arthroplasty on the right. The alignment appears near anatomic. There are expected postsurgical changes including subcutaneous gas and overlying soft tissue edema. There is no evidence for hardware fracture or failure. IMPRESSION: Status post right total hip arthroplasty without evidence for immediate hardware complication. Electronically Signed   By: Constance Holster M.D.   On: 02/15/2019 12:41    Assessment/Plan: 1 Day Post-Op   Active Problems:   Osteonecrosis of right hip ([place])   Up with therapy Discharge home with home health likely tomorrow Xarelto for 10 days RTC as scheduled   Lovell Sheehan , MD 02/16/2019, 6:41 PM

## 2019-02-16 NOTE — Progress Notes (Signed)
Physical Therapy Treatment Patient Details Name: Brenda [name] MRN: [id number] DOB: 1960-12-15 Today's Date: 02/16/2019    History of Present Illness [name] diagnosed with osteonecrosis right hip and is s/p elective R THA.  PMH includes COPD, asthma, HTN, and CHF.    PT Comments    Pt presented with deficits in strength, transfers, mobility, gait, balance, and activity tolerance.  Pt was Mod Ind with sup to sit with grossly improved speed and effort.  Pt was CGA with transfers with no instability noted during initial stand this session and with grossly decreased cuing required for proper sequencing.  Pt was able to amb 60' this session with slow, antalgic cadence and short B step length and declined additional amb this session secondary to increased R hip pain with WB.  Pt will benefit from HHPT upon discharge to safely address above deficits for decreased caregiver assistance and eventual return to PLOF.     Follow Up Recommendations  Home health PT;Supervision for mobility/OOB     Equipment Recommendations  Other (comment)(Pt may benefit from tub transfer bench, confirm with patient)    Recommendations for Other Services       Precautions / Restrictions Precautions Precautions: Anterior Hip Precaution Booklet Issued: Yes (comment) Restrictions Weight Bearing Restrictions: Yes RLE Weight Bearing: Weight bearing as tolerated    Mobility  Bed Mobility Overal bed mobility: Modified Independent Bed Mobility: Supine to Sit     Supine to sit: Modified independent (Device/Increase time)     General bed mobility comments: Extra time and effort and bed rail required but no physical assistance needed during sup to sit  Transfers Overall transfer level: Needs assistance Equipment used: Rolling walker (2 wheeled) Transfers: Sit to/from Stand Sit to Stand: Min guard         General transfer comment: No instability upon initial stand this session with good eccentric and  concentric control and fair carryover with proper hand placement  Ambulation/Gait Ambulation/Gait assistance: Min guard Gait Distance (Feet): 60 Feet Assistive device: Rolling walker (2 wheeled) Gait Pattern/deviations: Step-through pattern;Decreased step length - right;Decreased step length - left;Trunk flexed Gait velocity: decreased   General Gait Details: Amb 1 x 63' with a RW and CGA with min verbal cues for amb closer to the RW with pt declining additional amb secondary to increased R hip pain with WB this session   Stairs             Wheelchair Mobility    Modified Rankin (Stroke Patients Only)       Balance Overall balance assessment: Needs assistance   Sitting balance-Leahy Scale: Good     Standing balance support: Bilateral upper extremity supported;During functional activity Standing balance-Leahy Scale: Fair                              Cognition Arousal/Alertness: Awake/alert Behavior During Therapy: WFL for tasks assessed/performed Overall Cognitive Status: Within Functional Limits for tasks assessed                                        Exercises Total Joint Exercises Ankle Circles/Pumps: Strengthening;Both;10 reps;15 reps Quad Sets: Strengthening;Both;10 reps;15 reps Gluteal Sets: Strengthening;Both;15 reps;10 reps Towel Squeeze: Strengthening;Both;10 reps;15 reps Hip ABduction/ADduction: AROM;Both;5 reps;AAROM Straight Leg Raises: AROM;AAROM;5 reps;Both Long Arc Quad: Strengthening;Both;10 reps;15 reps Knee Flexion: Strengthening;Both;10 reps;15 reps  Marching in Standing: AROM;Both;10 reps;Standing    General Comments        Pertinent Vitals/Pain Pain Assessment: 0-10 Pain Score: 5  Pain Location: R hip Pain Descriptors / Indicators: Sore Pain Intervention(s): Premedicated before session;Monitored during session    Home Living                      Prior Function            PT Goals  (current goals can now be found in the care plan section) Progress towards PT goals: Progressing toward goals    Frequency    BID      PT Plan Current plan remains appropriate    Co-evaluation              AM-PAC PT "6 Clicks" Mobility   Outcome Measure  Help needed turning from your back to your side while in a flat bed without using bedrails?: A Little Help needed moving from lying on your back to sitting on the side of a flat bed without using bedrails?: A Little Help needed moving to and from a bed to a chair (including a wheelchair)?: A Little Help needed standing up from a chair using your arms (e.g., wheelchair or bedside chair)?: A Little Help needed to walk in hospital room?: A Little Help needed climbing 3-5 steps with a railing? : A Little 6 Click Score: 18    End of Session Equipment Utilized During Treatment: Gait belt Activity Tolerance: Patient limited by pain Patient left: in chair;with call bell/phone within reach;with chair alarm set;with SCD's reapplied;Other (comment)(ice to R hip) Nurse Communication: Mobility status PT Visit Diagnosis: Unsteadiness on feet (R26.81);Muscle weakness (generalized) (M62.81);Other abnormalities of gait and mobility (R26.89)     Time: [id number] PT Time Calculation (min) (ACUTE ONLY): 24 min  Charges:  $Gait Training: 8-22 mins $Therapeutic Exercise: 8-22 mins                     D. Scott Cyntia Staley PT, DPT 02/16/19, 4:51 PM

## 2019-02-16 NOTE — Anesthesia Postprocedure Evaluation (Signed)
Anesthesia Post Note  Patient: Brenda [name]  Procedure(s) Performed: TOTAL HIP ARTHROPLASTY ANTERIOR APPROACH (Right Hip)  Patient location during evaluation: Nursing Unit Anesthesia Type: Spinal Level of consciousness: oriented and awake and alert Pain management: pain level controlled Vital Signs Assessment: post-procedure vital signs reviewed and stable Respiratory status: spontaneous breathing and respiratory function stable Cardiovascular status: blood pressure returned to baseline and stable Postop Assessment: no headache, no backache, no apparent nausea or vomiting and patient able to bend at knees Anesthetic complications: no     Last Vitals:  Vitals:   02/16/19 0401 02/16/19 0737  BP: 124/88 (!) 140/93  Pulse: 75 84  Resp:  16  Temp:  37 C  SpO2:  98%    Last Pain:  Vitals:   02/16/19 0359  TempSrc: Oral  PainSc:                  [name]

## 2019-02-16 NOTE — Progress Notes (Signed)
Patient confused this evening.. She repeatedly kept thinking she was at home and not in the hospital. Easily redirected,

## 2019-02-16 NOTE — Progress Notes (Signed)
Physical Therapy Treatment Patient Details Name: Brenda [name] MRN: [id number] DOB: 31-Dec-1960 Today's Date: 02/16/2019    History of Present Illness [name] diagnosed with osteonecrosis right hip and is s/p elective R THA.  PMH includes COPD, asthma, HTN, and CHF.    PT Comments    Pt presented with deficits in strength, transfers, mobility, gait, balance, and activity tolerance but made good progress towards goals this session.  Pt was Mod Ind with sup to sit and progressed from needing min A during sit to stand at the beginning of the session for stability to requiring only CGA with proper sequencing.  Pt was able to amb 2 x 60' with a RW with slow, step-through cadence and short B step length but was steady without LOB.  Pt's SpO2 and HR were WNL during the session on room air.  Pt will benefit from HHPT services upon discharge to safely address above deficits for decreased caregiver assistance and eventual return to PLOF.     Follow Up Recommendations  Home health PT;Supervision for mobility/OOB     Equipment Recommendations  Other (comment)(Pt may benefit from tub transfer bench, need to confirm with patient)    Recommendations for Other Services       Precautions / Restrictions Precautions Precautions: Anterior Hip Precaution Booklet Issued: Yes (comment) Restrictions Weight Bearing Restrictions: Yes RLE Weight Bearing: Weight bearing as tolerated    Mobility  Bed Mobility Overal bed mobility: Modified Independent Bed Mobility: Supine to Sit     Supine to sit: Modified independent (Device/Increase time)     General bed mobility comments: Extra time and effort and bed rail required but no physical assistance needed during sup to sit  Transfers Overall transfer level: Needs assistance Equipment used: Rolling walker (2 wheeled) Transfers: Sit to/from Stand Sit to Stand: Min guard;Min assist         General transfer comment: Min A for stability upon  initial stand to prevent posterior LOB but improved during sit to/from stand training with cues for proper hand positioning  Ambulation/Gait Ambulation/Gait assistance: Min guard Gait Distance (Feet): 60 Feet Assistive device: Rolling walker (2 wheeled) Gait Pattern/deviations: Step-through pattern;Decreased step length - right;Decreased step length - left;Trunk flexed Gait velocity: decreased   General Gait Details: Amb 2 x 78' with a RW and CGA with min verbal cues for amb closer to the RW especially when turning to sit to chair/bed   [employer] Mobility    Modified Rankin (Stroke Patients Only)       Balance Overall balance assessment: Needs assistance   Sitting balance-Leahy Scale: Good     Standing balance support: Bilateral upper extremity supported;During functional activity Standing balance-Leahy Scale: Fair                              Cognition Arousal/Alertness: Awake/alert Behavior During Therapy: WFL for tasks assessed/performed Overall Cognitive Status: Within Functional Limits for tasks assessed                                 General Comments: Pt impulsive at times with cues needed for general safety      Exercises Total Joint Exercises Ankle Circles/Pumps: Strengthening;Both;10 reps;15 reps Quad Sets: Strengthening;Both;10 reps;15 reps Gluteal Sets: Strengthening;Both;15 reps;10 reps Towel Squeeze: Strengthening;Both;5 reps;10 reps  Heel Slides: AROM;Both;5 reps Hip ABduction/ADduction: AROM;Both;5 reps;AAROM Straight Leg Raises: AROM;AAROM;5 reps;Both Long Arc Quad: Strengthening;Both;10 reps Knee Flexion: Strengthening;Both;10 reps Marching in Standing: AROM;Both;10 reps;Standing Other Exercises Other Exercises: HEP education/review per handout    General Comments        Pertinent Vitals/Pain Pain Assessment: 0-10 Pain Score: 5  Pain Location: R hip Pain Descriptors / Indicators:  Shooting;Dull Pain Intervention(s): Premedicated before session;Monitored during session    Home Living                      Prior Function            PT Goals (current goals can now be found in the care plan section) Progress towards PT goals: Progressing toward goals    Frequency    BID      PT Plan Current plan remains appropriate    Co-evaluation              AM-PAC PT "6 Clicks" Mobility   Outcome Measure  Help needed turning from your back to your side while in a flat bed without using bedrails?: A Little Help needed moving from lying on your back to sitting on the side of a flat bed without using bedrails?: A Little Help needed moving to and from a bed to a chair (including a wheelchair)?: A Little Help needed standing up from a chair using your arms (e.g., wheelchair or bedside chair)?: A Little Help needed to walk in hospital room?: A Little Help needed climbing 3-5 steps with a railing? : A Little 6 Click Score: 18    End of Session Equipment Utilized During Treatment: Gait belt Activity Tolerance: Patient tolerated treatment well Patient left: in chair;with call bell/phone within reach;with chair alarm set;with SCD's reapplied;with family/visitor present;Other (comment)(ice to R hip) Nurse Communication: Mobility status PT Visit Diagnosis: Unsteadiness on feet (R26.81);Muscle weakness (generalized) (M62.81);Other abnormalities of gait and mobility (R26.89)     Time: [id number] PT Time Calculation (min) (ACUTE ONLY): 25 min  Charges:  $Gait Training: 8-22 mins $Therapeutic Exercise: 8-22 mins                     D. Scott Martin Belling PT, DPT 02/16/19, 12:04 PM

## 2019-02-16 NOTE — TOC Initial Note (Signed)
Transition of Care [facility]) - Initial/Assessment Note    Patient Details  Name: Brenda [name] MRN: [id number] Date of Birth: Jun 05, 1960  [facility] CM/SW Contact:    Su Hilt, RN Phone Number: 02/16/2019, 10:12 AM  Clinical Narrative:                 Met with the patient to discuss DC plan and needs She lives alone but her sister came from [facility] to stay with her She has DME at home and does not need additional She is up to date with PCP  She can afford her medications with no problems,\ Her sister will provide transportation She is open with PT with [facility] and would like to continue with them, I notified [place]  Expected Discharge Plan: [place] Barriers to Discharge: Continued Medical Work up   Patient Goals and CMS Choice Patient states their goals for this hospitalization and ongoing recovery are:: go home      Expected Discharge Plan and Services Expected Discharge Plan: [place]   Discharge Planning Services: CM Consult Post Acute Care Choice: [place] arrangements for the past 2 months: Single Family Home                 DME Arranged: N/A         HH Arranged: PT HH Agency: [facility] Date [place]: 02/16/19 Time HH Agency Contacted: 1011 Representative spoke with at [place]: [place]  Prior Living Arrangements/Services Living arrangements for the past 2 months: [place] with:: Self Patient language and need for interpreter reviewed:: Yes Do you feel safe going back to the place where you live?: Yes      Need for Family Participation in Patient Care: No (Comment) Care giver support system in place?: Yes (comment) Current home services: DME([facility], Kasandra Knudsen, RW) Criminal Activity/Legal Involvement Pertinent to Current Situation/Hospitalization: No - Comment as needed  Activities of Daily Living Home Assistive Devices/Equipment: Bedside commode/3-in-1,  Eyeglasses, Walker (specify type), Cane (specify quad or straight), Grab bars in shower ADL Screening (condition at time of admission) Patient's cognitive ability adequate to safely complete daily activities?: Yes Is the patient deaf or have difficulty hearing?: No Does the patient have difficulty seeing, even when wearing glasses/contacts?: No Does the patient have difficulty concentrating, remembering, or making decisions?: No Patient able to express need for assistance with ADLs?: Yes Does the patient have difficulty dressing or bathing?: No Independently performs ADLs?: Yes (appropriate for developmental age) Does the patient have difficulty walking or climbing stairs?: Yes Weakness of Legs: Right Weakness of Arms/Hands: None  Permission Sought/Granted   Permission granted to share information with : Yes, Verbal Permission Granted              Emotional Assessment Appearance:: Appears stated age Attitude/Demeanor/Rapport: Engaged Affect (typically observed): Appropriate Orientation: : Oriented to Self, Oriented to Place, Oriented to  Time, Oriented to Situation   Psych Involvement: No (comment)  Admission diagnosis:  M87.851 other osteonecrosis, right femur Patient Active Problem List   Diagnosis Date Noted  . Osteonecrosis of right hip ([place]) 02/15/2019  . Encounter for screening colonoscopy   . B12 deficiency 10/07/2014  . SOB (shortness of breath) 01/09/2014  . Chronic diastolic CHF (congestive heart failure) ([place])   . Atypical pneumonia   . Clostridium difficile colitis   . COPD (chronic obstructive pulmonary disease) ([place])   . 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  Peroneal tendinitis of left lower extremity 02/20/2013   PCP:  Elgie Collard, MD Pharmacy:   [id number] [place]Phone: [phone]: [phone]    Social Determinants of Health (SDOH) Interventions     Readmission Risk Interventions No flowsheet data found.

## 2019-02-17 LAB — COMPREHENSIVE METABOLIC PANEL
ALT: 41 U/L (ref 0–44)
ALT: 35 U/L (ref 0–44)
AST: 53 U/L — ABNORMAL HIGH (ref 15–41)
AST: 40 U/L (ref 15–41)
Albumin: 3.7 g/dL (ref 3.5–5.0)
Albumin: 3.3 g/dL — ABNORMAL LOW (ref 3.5–5.0)
Alkaline Phosphatase: 50 U/L (ref 38–126)
Alkaline Phosphatase: 44 U/L (ref 38–126)
Anion gap: 17 — ABNORMAL HIGH (ref 5–15)
Anion gap: 16 — ABNORMAL HIGH (ref 5–15)
BUN: 9 mg/dL (ref 6–20)
BUN: 7 mg/dL (ref 6–20)
CO2: 25 mmol/L (ref 22–32)
CO2: 24 mmol/L (ref 22–32)
Calcium: 8 mg/dL — ABNORMAL LOW (ref 8.9–10.3)
Calcium: 7.7 mg/dL — ABNORMAL LOW (ref 8.9–10.3)
Chloride: 88 mmol/L — ABNORMAL LOW (ref 98–111)
Chloride: 92 mmol/L — ABNORMAL LOW (ref 98–111)
Creatinine, Ser: 0.51 mg/dL (ref 0.44–1.00)
Creatinine, Ser: 0.35 mg/dL — ABNORMAL LOW (ref 0.44–1.00)
GFR calc Af Amer: >60 mL/min (ref >60)
GFR calc Af Amer: >60 mL/min (ref >60)
GFR calc non Af Amer: >60 mL/min (ref >60)
GFR calc non Af Amer: >60 mL/min (ref >60)
Glucose, Bld: 109 mg/dL — ABNORMAL HIGH (ref 70–99)
Glucose, Bld: 88 mg/dL (ref 70–99)
Potassium: 2.8 mmol/L — ABNORMAL LOW (ref 3.5–5.1)
Potassium: 2.5 mmol/L — CL (ref 3.5–5.1)
Sodium: 130 mmol/L — ABNORMAL LOW (ref 135–145)
Sodium: 132 mmol/L — ABNORMAL LOW (ref 135–145)
Total Bilirubin: 1.7 mg/dL — ABNORMAL HIGH (ref 0.3–1.2)
Total Bilirubin: 1.6 mg/dL — ABNORMAL HIGH (ref 0.3–1.2)
Total Protein: 6.7 g/dL (ref 6.5–8.1)
Total Protein: 6.3 g/dL — ABNORMAL LOW (ref 6.5–8.1)

## 2019-02-17 LAB — CBC
HCT: 32 % — ABNORMAL LOW (ref 36.0–46.0)
HCT: 32.1 % — ABNORMAL LOW (ref 36.0–46.0)
HCT: 30 % — ABNORMAL LOW (ref 36.0–46.0)
Hemoglobin: 10.8 g/dL — ABNORMAL LOW (ref 12.0–15.0)
Hemoglobin: 10.8 g/dL — ABNORMAL LOW (ref 12.0–15.0)
Hemoglobin: 10.2 g/dL — ABNORMAL LOW (ref 12.0–15.0)
MCH: 34.5 pg — ABNORMAL HIGH (ref 26.0–34.0)
MCH: 34.7 pg — ABNORMAL HIGH (ref 26.0–34.0)
MCH: 34.9 pg — ABNORMAL HIGH (ref 26.0–34.0)
MCHC: 33.8 g/dL (ref 30.0–36.0)
MCHC: 33.6 g/dL (ref 30.0–36.0)
MCHC: 34 g/dL (ref 30.0–36.0)
MCV: 102.2 fL — ABNORMAL HIGH (ref 80.0–100.0)
MCV: 103.2 fL — ABNORMAL HIGH (ref 80.0–100.0)
MCV: 102.7 fL — ABNORMAL HIGH (ref 80.0–100.0)
Platelets: 93 10*3/uL — ABNORMAL LOW (ref 150–400)
Platelets: 91 10*3/uL — ABNORMAL LOW (ref 150–400)
Platelets: 103 10*3/uL — ABNORMAL LOW (ref 150–400)
RBC: 3.13 MIL/uL — ABNORMAL LOW (ref 3.87–5.11)
RBC: 3.11 MIL/uL — ABNORMAL LOW (ref 3.87–5.11)
RBC: 2.92 MIL/uL — ABNORMAL LOW (ref 3.87–5.11)
RDW: 12.1 % (ref 11.5–15.5)
RDW: 12.6 % (ref 11.5–15.5)
RDW: 12.3 % (ref 11.5–15.5)
WBC: 6.1 10*3/uL (ref 4.0–10.5)
WBC: 6.7 10*3/uL (ref 4.0–10.5)
WBC: 5.2 10*3/uL (ref 4.0–10.5)
nRBC: 0 % (ref 0.0–0.2)
nRBC: 0 % (ref 0.0–0.2)
nRBC: 0 % (ref 0.0–0.2)

## 2019-02-17 LAB — PHOSPHORUS
Phosphorus: 3.7 mg/dL (ref 2.5–4.6)
Phosphorus: 3.9 mg/dL (ref 2.5–4.6)

## 2019-02-17 LAB — MAGNESIUM
Magnesium: 0.7 mg/dL — CL (ref 1.7–2.4)
Magnesium: 2.4 mg/dL (ref 1.7–2.4)

## 2019-02-17 MED ORDER — THIAMINE HCL 100 MG/ML IJ SOLN: 100.0000 mg | Freq: Every day | INTRAMUSCULAR | Status: DC

## 2019-02-17 MED ORDER — VITAMIN B-1 100 MG PO TABS
100.0000 mg | ORAL_TABLET | Freq: Every day | ORAL | Status: DC
  Administered 2019-02-18 – 2019-02-20 (×3): 100 mg via ORAL
  Filled 2019-02-17 (×3): qty 1

## 2019-02-17 MED ORDER — METHOCARBAMOL 500 MG PO TABS: 500.0000 mg | ORAL_TABLET | Freq: Four times a day (QID) | ORAL | 0 refills | Status: DC | PRN

## 2019-02-17 MED ORDER — VITAMIN B-1 100 MG PO TABS: 100.0000 mg | ORAL_TABLET | Freq: Every day | ORAL | Status: DC

## 2019-02-17 MED ORDER — ADULT MULTIVITAMIN W/MINERALS CH
1.0000 | ORAL_TABLET | Freq: Every day | ORAL | Status: DC
  Administered 2019-02-18 – 2019-02-20 (×3): 1 via ORAL
  Filled 2019-02-17 (×3): qty 1

## 2019-02-17 MED ORDER — DEXMEDETOMIDINE HCL IN NACL 400 MCG/100ML IV SOLN
0.4000 ug/kg/h | INTRAVENOUS | Status: DC
  Administered 2019-02-17 – 2019-02-18 (×3): 0.7 ug/kg/h via INTRAVENOUS
  Filled 2019-02-17 (×3): qty 100

## 2019-02-17 MED ORDER — DOCUSATE SODIUM 100 MG PO CAPS: 100.0000 mg | ORAL_CAPSULE | Freq: Two times a day (BID) | ORAL | 0 refills | Status: DC

## 2019-02-17 MED ORDER — LORAZEPAM 1 MG PO TABS
1.0000 mg | ORAL_TABLET | ORAL | Status: DC | PRN
  Administered 2019-02-18: 2 mg via ORAL
  Filled 2019-02-17: qty 2

## 2019-02-17 MED ORDER — LORAZEPAM 1 MG PO TABS: 1.0000 mg | ORAL_TABLET | ORAL | Status: DC | PRN

## 2019-02-17 MED ORDER — LORAZEPAM 2 MG/ML IJ SOLN: 1.0000 mg | INTRAMUSCULAR | Status: DC | PRN

## 2019-02-17 MED ORDER — FOLIC ACID 1 MG PO TABS: 1.0000 mg | ORAL_TABLET | Freq: Every day | ORAL | Status: DC

## 2019-02-17 MED ORDER — POTASSIUM CHLORIDE 10 MEQ/100ML IV SOLN
10.0000 meq | INTRAVENOUS | Status: AC
  Administered 2019-02-17 (×2): 10 meq via INTRAVENOUS
  Filled 2019-02-17 (×2): qty 100

## 2019-02-17 MED ORDER — ADULT MULTIVITAMIN W/MINERALS CH: 1.0000 | ORAL_TABLET | Freq: Every day | ORAL | Status: DC

## 2019-02-17 MED ORDER — LORAZEPAM 2 MG/ML IJ SOLN: 0.0000 mg | Freq: Two times a day (BID) | INTRAMUSCULAR | Status: DC

## 2019-02-17 MED ORDER — LORAZEPAM 2 MG/ML IJ SOLN: 0.0000 mg | Freq: Four times a day (QID) | INTRAMUSCULAR | Status: DC

## 2019-02-17 MED ORDER — POTASSIUM CHLORIDE 10 MEQ/100ML IV SOLN
10.0000 meq | INTRAVENOUS | Status: AC
  Administered 2019-02-17 – 2019-02-18 (×6): 10 meq via INTRAVENOUS
  Filled 2019-02-17 (×6): qty 100

## 2019-02-17 MED ORDER — HYDROCODONE-ACETAMINOPHEN 5-325 MG PO TABS: 1.0000 | ORAL_TABLET | ORAL | 0 refills | Status: DC | PRN

## 2019-02-17 MED ORDER — DEXMEDETOMIDINE HCL IN NACL 200 MCG/50ML IV SOLN: 0.4000 ug/kg/h | INTRAVENOUS | Status: DC

## 2019-02-17 MED ORDER — RIVAROXABAN 10 MG PO TABS: 10.0000 mg | ORAL_TABLET | Freq: Every day | ORAL | 0 refills | Status: DC

## 2019-02-17 MED ORDER — MAGNESIUM SULFATE 4 GM/100ML IV SOLN
4.0000 g | Freq: Once | INTRAVENOUS | Status: AC
  Administered 2019-02-17: 4 g via INTRAVENOUS
  Filled 2019-02-17: qty 100

## 2019-02-17 MED ORDER — LORAZEPAM 2 MG/ML IJ SOLN
1.0000 mg | INTRAMUSCULAR | Status: DC | PRN
  Administered 2019-02-17: 2 mg via INTRAVENOUS
  Administered 2019-02-17 (×2): 4 mg via INTRAVENOUS
  Filled 2019-02-17 (×2): qty 2
  Filled 2019-02-17: qty 1

## 2019-02-17 MED ORDER — FOLIC ACID 1 MG PO TABS
1.0000 mg | ORAL_TABLET | Freq: Every day | ORAL | Status: DC
  Administered 2019-02-18 – 2019-02-20 (×3): 1 mg via ORAL
  Filled 2019-02-17 (×3): qty 1

## 2019-02-17 MED ORDER — CHLORHEXIDINE GLUCONATE CLOTH 2 % EX PADS
6.0000 | MEDICATED_PAD | Freq: Every day | CUTANEOUS | Status: DC
  Administered 2019-02-17 – 2019-02-18 (×2): 6 via TOPICAL

## 2019-02-17 NOTE — Consult Note (Signed)
Patient Demographics  Brenda [name], is a 58 y.o. female   MRN: [id number]   DOB - 1961/04/24  Admit Date - 02/15/2019    Outpatient Primary MD for the patient is Elgie Collard, MD  Consult requested in the Hospital by Lovell Sheehan, MD, On 02/17/2019    Reason for consult ; alcohol withdrawal.  HPI: 58 year old female patient admitted because right osteonecrosis of the right hip status post elective right THA, patient supposed to be discharged beta-blocker to severe withdrawal symptoms, this morning patient heart rate was very high as 160, patient's nurse Anderson Malta called rapid response because of concern for withdrawal symptoms, patient received a total of 8 mg of Ativan in 1 and half hour, 2 mg morphine.  Patient lives alone, according to sister she does drink daily.  Do not know how much she drinks as patient lives alone, note patient is admitted to [place] noted to have severe hypokalemia, hypomagnesemia on blood work. With History of -  Past Medical History:  Diagnosis Date  . Anemia   . Arthritis   . Asthma   . Atypical pneumonia    a. 12/2013  . B12 deficiency 10/07/2014  . Clostridium difficile colitis    a. 12/2013.was sick with pneumonia and sepsis  . Hypertension   . Lower extremity edema       Past Surgical History:  Procedure Laterality Date  . COLONOSCOPY WITH PROPOFOL N/A 08/30/2017   Procedure: COLONOSCOPY WITH PROPOFOL;  Surgeon: Lin Landsman, MD;  Location: [facility] ENDOSCOPY;  Service: Gastroenterology;  Laterality: N/A;  . COLONOSCOPY WITH PROPOFOL N/A 08/31/2017   Procedure: COLONOSCOPY WITH PROPOFOL;  Surgeon: Lin Landsman, MD;  Location: [facility] ENDOSCOPY;  Service: Gastroenterology;  Laterality: N/A;  . KNEE SURGERY Left 2014   arthroscopy. had to replace tendon with cadaver  tissue  . NECK SURGERY  2014   metal plate in neck  . SHOULDER SURGERY Left 2012   arthroscopy  . THROAT SURGERY  2013   removed nodules.  benign  . TONSILLECTOMY    . TOTAL HIP ARTHROPLASTY Right 02/15/2019   Procedure: TOTAL HIP ARTHROPLASTY ANTERIOR APPROACH;  Surgeon: Lovell Sheehan, MD;  Location: [facility] ORS;  Service: Orthopedics;  Laterality: Right;       Review of Systems    Unable to obtain review of systems because of sedation effect  Social History Social History   Tobacco Use  . Smoking status: Current Some Day Smoker    Packs/day: 0.50    Years: 8.00    Pack years: 4.00    Types: Cigarettes  . Smokeless tobacco: Never Used  . Tobacco comment: smokes about 2 cigs per day  Substance Use Topics  . Alcohol use: Yes    Comment: SOCIAL DRINKER 1-2 vodka a week    Family History Family History  Problem Relation Age of Onset  . Multiple myeloma Mother   . Breast cancer Maternal Aunt   . Lung cancer Maternal Grandmother   . Parkinson's disease  Father      Prior to Admission medications   Medication Sig Start Date End Date Taking? Authorizing Provider  acetaminophen (TYLENOL) 325 MG tablet Take 650 mg by mouth every 6 (six) hours as needed for moderate pain.   Yes [provider]  albuterol (PROVENTIL HFA;VENTOLIN HFA) 108 (90 BASE) MCG/ACT inhaler Inhale 2 puffs into the lungs every 6 (six) hours as needed for wheezing or shortness of breath. 01/30/14  Yes Juanito Doom, MD  aspirin 81 MG tablet Take 81 mg by mouth daily.   Yes [provider]  Biotin 10000 MCG TABS Take 10,000 mcg by mouth daily.   Yes [provider]  calcium carbonate (OSCAL) 1500 (600 Ca) MG TABS tablet Take 600 mg of elemental calcium by mouth daily with breakfast.   Yes [provider]  cetirizine (ZYRTEC) 10 MG tablet Take 10 mg by mouth daily.   Yes [provider]  Coenzyme Q10 100 MG TABS Take 100 mg by mouth daily.    Yes [provider]  cyanocobalamin (,VITAMIN B-12,) 1000 MCG/ML injection Inject 1,000 mcg into the muscle See admin instructions. Inject 1000 mcg intramuscularly every 60 days   Yes [provider]  diclofenac sodium (VOLTAREN) 1 % GEL Apply 2 g topically 3 (three) times daily as needed (pain).  01/21/17  Yes [provider]  furosemide (LASIX) 20 MG tablet Take 1 tablet (20 mg total) by mouth daily. 01/11/14  Yes Theora Gianotti, NP  ibuprofen (ADVIL) 100 MG/5ML suspension Take 200 mg by mouth every 4 (four) hours as needed.   Yes [provider]  metoprolol tartrate (LOPRESSOR) 100 MG tablet Take 100 mg by mouth every evening.    Yes [provider]  Multiple Vitamins-Minerals (MULTIVITAMIN GUMMIES ADULT PO) Take 1 tablet by mouth daily.    Yes [provider]  Omega-3 Fatty Acids (FISH OIL) 1000 MG CAPS Take 1,000 mg by mouth daily.    Yes [provider]  Potassium Gluconate 550 (90 K) MG TABS Take 550 mg by mouth daily.   Yes [provider]  Probiotic Product (PROBIOTIC & ACIDOPHILUS EX ST PO) Take 1 capsule by mouth daily.    Yes [provider]  Vitamin D, Ergocalciferol, (DRISDOL) 1.25 MG (50000 UT) CAPS capsule Take 50,000 Units by mouth See admin instructions. Take 50000 units by mouth every 60 days with b12 injection   Yes [provider]  docusate sodium (COLACE) 100 MG capsule Take 1 capsule (100 mg total) by mouth 2 (two) times daily. 02/17/19   Carlynn Spry, PA-C  HYDROcodone-acetaminophen (NORCO/VICODIN) 5-325 MG tablet Take 1-2 tablets by mouth every 4 (four) hours as needed for moderate pain (pain score 4-6). 02/17/19   Carlynn Spry, PA-C  methocarbamol (ROBAXIN) 500 MG tablet Take 1 tablet (500 mg total) by mouth every 6 (six) hours as needed for muscle spasms. 02/17/19   Carlynn Spry, PA-C  rivaroxaban (XARELTO) 10 MG TABS tablet Take 1 tablet (10 mg total) by mouth daily with breakfast. May  continue aspirin once xarelto discontinued 02/17/19   Carlynn Spry, PA-C  traMADol (ULTRAM) 50 MG tablet Take 1 tablet (50 mg total) by mouth every 6 (six) hours as needed. Patient not taking: Reported on 02/01/2019 12/18/18   Versie Starks, PA-C    Anti-infectives (From admission, onward)   Start     Dose/Rate Route Frequency Ordered Stop   02/15/19 1700  clindamycin (CLEOCIN) IVPB 600 mg  600 mg 100 mL/hr over 30 Minutes Intravenous Every 6 hours 02/15/19 1536 02/15/19 2245   02/15/19 1220  50,000 units bacitracin in 0.9% normal saline 250 mL irrigation  Status:  Discontinued       As needed 02/15/19 1220 02/15/19 1251   02/15/19 0845  clindamycin (CLEOCIN) 900 MG/50ML IVPB    Note to Pharmacy: Register, Jazminn   : cabinet override      02/15/19 0845 02/15/19 1055   02/15/19 0600  clindamycin (CLEOCIN) IVPB 900 mg     900 mg 100 mL/hr over 30 Minutes Intravenous On call to O.R. 02/14/19 2205 02/15/19 1055      Scheduled Meds: . docusate sodium  100 mg Oral BID  . folic acid  1 mg Oral Daily  . furosemide  20 mg Oral Daily  . loratadine  10 mg Oral Daily  . LORazepam  0-4 mg Intravenous Q6H   Followed by  . [START ON 02/19/2019] LORazepam  0-4 mg Intravenous Q12H  . metoprolol tartrate  100 mg Oral QPM  . multivitamin with minerals  1 tablet Oral Daily  . rivaroxaban  10 mg Oral Q breakfast  . thiamine  100 mg Oral Daily   Or  . thiamine  100 mg Intravenous Daily   Continuous Infusions: . lactated ringers 75 mL/hr at 02/15/19 1655  . methocarbamol (ROBAXIN) IV     PRN Meds:.acetaminophen, albuterol, alum & mag hydroxide-simeth, bisacodyl, HYDROcodone-acetaminophen, HYDROcodone-acetaminophen, LORazepam **OR** LORazepam, magnesium citrate, magnesium hydroxide, menthol-cetylpyridinium **OR** phenol, methocarbamol **OR** methocarbamol (ROBAXIN) IV, metoCLOPramide **OR** metoCLOPramide (REGLAN) injection, morphine injection, ondansetron **OR** ondansetron (ZOFRAN) IV  Allergies   Allergen Reactions  . Spiriva [Tiotropium Bromide Monohydrate] Other (See Comments)    CLOSES THROAT  . Shellfish Allergy Diarrhea and Nausea And Vomiting  . Penicillins     DOES NOT KNOW WHAT TYPE OF REACTION   Patient was 58 years old when she had this  . Codeine Nausea And Vomiting    Physical Exam  Vitals  Blood pressure 102/71, pulse (!) 116, temperature 99.2 F (37.3 C), resp. rate (!) 32, height '5\' 4"'$  (1.626 m), weight 81.6 kg, SpO2 94 %.   1.  Patient is sedated at this time.. 2.  Sedated.   3. No F.N deficits, ALL C.Nerves Intact, Strength 5/5 all 4 extremities, Sensation intact all 4 extremities, Plantars down going.  4. Ears and Eyes appear Normal, Conjunctivae clear, PERRLA. Moist Oral Mucosa.  5. Supple Neck, No JVD, No cervical lymphadenopathy appriciated, No Carotid Bruits.  6. Symmetrical Chest wall movement, Good air movement bilaterally, CTAB.  7. RRR, No Gallops, Rubs or Murmurs, No Parasternal Heave.  8. Positive Bowel Sounds, Abdomen Soft, No tenderness, No organomegaly appriciated,No rebound -guarding or rigidity.  9.  No Cyanosis, decreased skin turgor 10. Good muscle tone,  joints appear normal , no effusions, Normal ROM.  11. No Palpable Lymph Nodes in Neck or Axillae    Data Review  CBC Recent Labs  Lab 02/15/19 0923 02/16/19 0629 02/17/19 0449 02/17/19 1347  WBC  --  4.5 6.1 6.7  HGB 13.6 11.2* 10.8* 10.8*  HCT 40.0 33.6* 32.0* 32.1*  PLT  --  91* 93* 91*  MCV  --  103.1* 102.2* 103.2*  MCH  --  34.4* 34.5* 34.7*  MCHC  --  33.3 33.8 33.6  RDW  --  12.2 12.1 12.6   ------------------------------------------------------------------------------------------------------------------  Chemistries  Recent Labs  Lab 02/15/19 0923 02/16/19 0629 02/17/19 1347  NA 138 136 130*  K 3.3* 3.7 2.8*  CL 98 92* 88*  CO2  --  33* 25  GLUCOSE 257* 123* 109*  BUN <3* 5* 9  CREATININE 0.30* 0.37* 0.51  CALCIUM  --  8.3* 8.0*  MG  --   --   0.7*  AST  --   --  53*  ALT  --   --  41  ALKPHOS  --   --  50  BILITOT  --   --  1.7*   ------------------------------------------------------------------------------------------------------------------ estimated creatinine clearance is 80.2 mL/min (by C-G formula based on SCr of 0.51 mg/dL). ------------------------------------------------------------------------------------------------------------------ No results for input(s): TSH, T4TOTAL, T3FREE, THYROIDAB in the last 72 hours.  Invalid input(s): FREET3   Coagulation profile No results for input(s): INR, PROTIME in the last 168 hours. ------------------------------------------------------------------------------------------------------------------- No results for input(s): DDIMER in the last 72 hours. -------------------------------------------------------------------------------------------------------------------  Cardiac Enzymes No results for input(s): CKMB, TROPONINI, MYOGLOBIN in the last 168 hours.  Invalid input(s): CK ------------------------------------------------------------------------------------------------------------------ Invalid input(s): POCBNP   ---------------------------------------------------------------------------------------------------------------  Urinalysis    Component Value Date/Time   COLORURINE STRAW (A) 02/07/2019 1021   APPEARANCEUR CLEAR (A) 02/07/2019 1021   APPEARANCEUR Cloudy 12/22/2013 1436   LABSPEC 1.008 02/07/2019 1021   LABSPEC 1.024 12/22/2013 1436   PHURINE 6.0 02/07/2019 1021   GLUCOSEU NEGATIVE 02/07/2019 1021   GLUCOSEU Negative 12/22/2013 1436   HGBUR SMALL (A) 02/07/2019 1021   BILIRUBINUR NEGATIVE 02/07/2019 1021   BILIRUBINUR 2+ 12/22/2013 1436   KETONESUR NEGATIVE 02/07/2019 1021   PROTEINUR NEGATIVE 02/07/2019 1021   NITRITE NEGATIVE 02/07/2019 1021   LEUKOCYTESUR NEGATIVE 02/07/2019 1021   LEUKOCYTESUR Negative 12/22/2013 1436     Imaging results:    No results found.    Assessment & Plan  Active Problems:   Osteonecrosis of right hip (HCC)    1.  Severe sinus tachycardia, heart rate 160 bpm this morning associated with severe tremors.  Alcohol withdrawal, patient is going to be placed on CIWA protocol along with Ativan.  Patient so far received Ativan 8 mg and last 82mnutes, now sedated.  According to nurse patient was very agitated, while this morning with severe tachycardia, rapid response was called, patient will go to ICU if the Ativan does not help for possible Precedex infusion for alcohol withdrawal symptoms.  As patient lives alone we do not know how much she drinks.  Patient sister mentioned that she drinks daily. Continue thiamine, folic acid, IV fluids  2.  Severe hypokalemia, hypomagnesemia, pharmacy consulted for update management. 3.  Right hip osteonecrosis status post right hip surgery, Ortho managing, pulmonology plan to discharge today but because of withdrawal patient discharge canceled, on Xarelto   -DVT prophylaxis; on Xarelto.  AM Labs Ordered, also please review Full Orders     Thank you for the consult, we will follow the patient with you in the Hospital.   SEpifanio LeschesM.D on 02/17/2019 at 3:46 PM  Note: This dictation was prepared with Dragon dictation along with smaller phrase technology. Any transcriptional errors that result from this process are unintentional.

## 2019-02-17 NOTE — Significant Event (Signed)
Rapid Response Event Note  Overview: Time Called: 1402 Arrival Time: 1407 Event Type: Other (Comment)(etoh withdrawal)  Initial Focused Assessment: Patient agitated- elevated BP and HR-ETOH withdrawal- Interventions: Patient given 4mg  of ativan and 1mg  of morphine.  After medication administration- patient sleeping- vitals stable- oxygen 94% on room air.  Sister at bedside.    Plan of Care (if not transferred): Dr. Benjie Karvonen made aware that patient is resting at this time- will keep on floor- Potassium and Magnesium needing replacement.  If patient becomes more agitated and aggressive later- patient may need to be transferred to ICU for closer monitoring.     Event Summary:   at      at          [facility]

## 2019-02-17 NOTE — Progress Notes (Signed)
Physical Therapy Treatment Patient Details Name: Brenda [name] MRN: [id number] DOB: Jul 28, 1960 Today's Date: 02/17/2019    History of Present Illness [name] diagnosed with osteonecrosis right hip and is s/p elective R THA.  PMH includes COPD, asthma, HTN, and CHF.    PT Comments    Pt limited by increased confusion and decreased orientation this session.  Pt stated she was standing up while in supine and reported that she was currently at an apartment with two of her friends.  Pt also with noted BUE/BLE tremors, nursing notified.  Pt's HR at rest ranged from the low 100s to low 120s with BP taken at 142/96.  SpO2 WNL throughout.  Pt participated with low intensity BLE therex in supine but required verbal and tactile cuing alone with physical assistance throughout.  Transfers/gait deferred this session for patient safety.  Pt will benefit from HHPT services once medically cleared for discharge with orientation returned to baseline to safely address above deficits for decreased caregiver assistance and eventual return to PLOF.       Follow Up Recommendations  Home health PT;Supervision for mobility/OOB     Equipment Recommendations  Other (comment)(Possible tub transfer bench, confirm pt wishes)    Recommendations for Other Services       Precautions / Restrictions Precautions Precautions: Anterior Hip Precaution Booklet Issued: Yes (comment) Restrictions Weight Bearing Restrictions: Yes RLE Weight Bearing: Weight bearing as tolerated    Mobility  Bed Mobility               General bed mobility comments: Not safe to attempt this session secondry to whole body shaking and significant confusion/disorientation  Transfers                 General transfer comment: Not safe to attempt this session secondry to whole body shaking and significant confusion/disorientation  Ambulation/Gait             General Gait Details: Not safe to attempt this session  secondry to whole body shaking and significant confusion/disorientation   Stairs             Wheelchair Mobility    Modified Rankin (Stroke Patients Only)       Balance                                            Cognition Arousal/Alertness: Awake/alert Behavior During Therapy: Restless;Anxious Overall Cognitive Status: Impaired/Different from baseline Area of Impairment: Orientation;Following commands;Safety/judgement                 Orientation Level: Disoriented to;Place;Time   Memory: Decreased short-term memory Following Commands: Follows one step commands inconsistently Safety/Judgement: Decreased awareness of safety Awareness: Intellectual   General Comments: Pt with a significant decline in orientation/increase in confusion this session including thinking she was standing up while in supine and stating that she was currently at an apartment with two of her friends.      Exercises Other Exercises Other Exercises: Pt partcipated with BLE supine ankle, knee, and hip AAROM but required extensive verbal and tactile cuing to complete exercises along with physical assistance    General Comments        Pertinent Vitals/Pain Pain Assessment: 0-10 Pain Score: 7  Pain Location: R hip Pain Descriptors / Indicators: Sore Pain Intervention(s): Premedicated before session;Monitored during session    Home  Living Family/patient expects to be discharged to:: Private residence Living Arrangements: Alone Available Help at Discharge: Family;Available 24 hours/day Type of Home: Apartment Home Access: Level entry   Home Layout: One level Home Equipment: Bedside commode;Walker - 2 wheels;Cane - quad Additional Comments: Pt's sister is staying with the patient 24/7 as long as needed    Prior Function Level of Independence: Independent with assistive device(s)      Comments: Mod Ind amb with a RW for R hip pain control, works PT as Scientist, water quality at [employer], Ind with ADLs, no fall history   PT Goals (current goals can now be found in the care plan section) Acute Rehab PT Goals Patient Stated Goal: To get my life back and to get back to work Progress towards PT goals: Not progressing toward goals - comment(limited by confusion this session)    Frequency    BID      PT Plan Current plan remains appropriate    Co-evaluation              AM-PAC PT "6 Clicks" Mobility   Outcome Measure  Help needed turning from your back to your side while in a flat bed without using bedrails?: A Little Help needed moving from lying on your back to sitting on the side of a flat bed without using bedrails?: A Little Help needed moving to and from a bed to a chair (including a wheelchair)?: A Little Help needed standing up from a chair using your arms (e.g., wheelchair or bedside chair)?: A Little Help needed to walk in hospital room?: A Little Help needed climbing 3-5 steps with a railing? : A Little 6 Click Score: 18    End of Session   Activity Tolerance: Other (comment)(Session limited by pt confusion/disorientation) Patient left: in bed;with bed alarm set;with call bell/phone within reach Nurse Communication: Mobility status;Other (comment)(Nursing notified of pt's increased confusion, disorientation, and whole body tremors) PT Visit Diagnosis: Unsteadiness on feet (R26.81);Muscle weakness (generalized) (M62.81);Other abnormalities of gait and mobility (R26.89)     Time: [id number] PT Time Calculation (min) (ACUTE ONLY): 17 min  Charges:  $Therapeutic Exercise: 8-22 mins                     D. Scott Esli Jernigan PT, DPT 02/17/19, 11:38 AM

## 2019-02-17 NOTE — Consult Note (Signed)
PULMONARY / CRITICAL CARE MEDICINE  Name: Brenda [name] MRN: [id number] DOB: 02-23-1961    LOS: 2  Referring Provider: Dr. Vianne Bulls Reason for Referral:  Acute ETOH withdrawal Brief patient description:  58 y/o female admitted with a osteonecrosis of the right hip, now s/p right hip arthroplasty. Today patient developed acute alcohol withdrawal necessitating ICU transfer for precedex  HPI: 58 y/o female with a medical history as indicated below admitted with right hip osteonecrosis. She underwent a Right Total Hip Replacement on 02/15/2019. On post-op day 2, she developed severe ETOH withdrawal. She was given multiple doses of lorazepam without any effect. She was transferred to the ICU for a precedex infusion.   Past Medical History:  Diagnosis Date  . Anemia   . Arthritis   . Asthma   . Atypical pneumonia    a. 12/2013  . B12 deficiency 10/07/2014  . Clostridium difficile colitis    a. 12/2013.was sick with pneumonia and sepsis  . Hypertension   . Lower extremity edema    Past Surgical History:  Procedure Laterality Date  . COLONOSCOPY WITH PROPOFOL N/A 08/30/2017   Procedure: COLONOSCOPY WITH PROPOFOL;  Surgeon: Lin Landsman, MD;  Location: [facility] ENDOSCOPY;  Service: Gastroenterology;  Laterality: N/A;  . COLONOSCOPY WITH PROPOFOL N/A 08/31/2017   Procedure: COLONOSCOPY WITH PROPOFOL;  Surgeon: Lin Landsman, MD;  Location: [facility] ENDOSCOPY;  Service: Gastroenterology;  Laterality: N/A;  . KNEE SURGERY Left 2014   arthroscopy. had to replace tendon with cadaver tissue  . NECK SURGERY  2014   metal plate in neck  . SHOULDER SURGERY Left 2012   arthroscopy  . THROAT SURGERY  2013   removed nodules.  benign  . TONSILLECTOMY    . TOTAL HIP ARTHROPLASTY Right 02/15/2019   Procedure: TOTAL HIP ARTHROPLASTY ANTERIOR APPROACH;  Surgeon: Lovell Sheehan, MD;  Location: [facility] ORS;  Service: Orthopedics;  Laterality: Right;   No current facility-administered medications on file  prior to encounter.    Current Outpatient Medications on File Prior to Encounter  Medication Sig  . acetaminophen (TYLENOL) 325 MG tablet Take 650 mg by mouth every 6 (six) hours as needed for moderate pain.  Marland Kitchen albuterol (PROVENTIL HFA;VENTOLIN HFA) 108 (90 BASE) MCG/ACT inhaler Inhale 2 puffs into the lungs every 6 (six) hours as needed for wheezing or shortness of breath.  Marland Kitchen aspirin 81 MG tablet Take 81 mg by mouth daily.  . Biotin 10000 MCG TABS Take 10,000 mcg by mouth daily.  . calcium carbonate (OSCAL) 1500 (600 Ca) MG TABS tablet Take 600 mg of elemental calcium by mouth daily with breakfast.  . cetirizine (ZYRTEC) 10 MG tablet Take 10 mg by mouth daily.  . Coenzyme Q10 100 MG TABS Take 100 mg by mouth daily.   . cyanocobalamin (,VITAMIN B-12,) 1000 MCG/ML injection Inject 1,000 mcg into the muscle See admin instructions. Inject 1000 mcg intramuscularly every 60 days  . diclofenac sodium (VOLTAREN) 1 % GEL Apply 2 g topically 3 (three) times daily as needed (pain).   . furosemide (LASIX) 20 MG tablet Take 1 tablet (20 mg total) by mouth daily.  . metoprolol tartrate (LOPRESSOR) 100 MG tablet Take 100 mg by mouth every evening.   . Multiple Vitamins-Minerals (MULTIVITAMIN GUMMIES ADULT PO) Take 1 tablet by mouth daily.   . Omega-3 Fatty Acids (FISH OIL) 1000 MG CAPS Take 1,000 mg by mouth daily.   . Potassium Gluconate 550 (90 K) MG TABS Take 550 mg by mouth daily.  Marland Kitchen  Probiotic Product (PROBIOTIC & ACIDOPHILUS EX ST PO) Take 1 capsule by mouth daily.   . Vitamin D, Ergocalciferol, (DRISDOL) 1.25 MG (50000 UT) CAPS capsule Take 50,000 Units by mouth See admin instructions. Take 50000 units by mouth every 60 days with b12 injection  . traMADol (ULTRAM) 50 MG tablet Take 1 tablet (50 mg total) by mouth every 6 (six) hours as needed. (Patient not taking: Reported on 02/01/2019)    Allergies Allergies  Allergen Reactions  . Spiriva [Tiotropium Bromide Monohydrate] Other (See Comments)     CLOSES THROAT  . Shellfish Allergy Diarrhea and Nausea And Vomiting  . Penicillins     DOES NOT KNOW WHAT TYPE OF REACTION   Patient was 58 years old when she had this  . Codeine Nausea And Vomiting    Family History Family History  Problem Relation Age of Onset  . Multiple myeloma Mother   . Breast cancer Maternal Aunt   . Lung cancer Maternal Grandmother   . Parkinson's disease Father    Social History  reports that she has been smoking cigarettes. She has a 4.00 pack-year smoking history. She has never used smokeless tobacco. She reports current alcohol use. She reports that she does not use drugs.  Review Of Systems:  Unable to obtain as patient is severely somnolent  VITAL SIGNS: BP (!) 126/113   Pulse (!) 120   Temp 99.3 F (37.4 C) (Oral)   Resp (!) 22   Ht 5' 4"  (1.626 m)   Wt 81.6 kg   SpO2 95%   BMI 30.90 kg/m   HEMODYNAMICS:    VENTILATOR SETTINGS:    INTAKE / OUTPUT: I/O last 3 completed shifts: In: 1121.8 [P.O.:960; IV Piggyback:161.8] Out: 2 [Urine:1; Stool:1]  PHYSICAL EXAMINATION: General:  NAD HEENT: PERRLA, trachea midline Neuro: Somnolent due to precedex, moves all extremities Cardiovascular:  AP regular, S1/S2, no edema Lungs: CTAB Abdomen:  ND/NT, normal bowel sounds Musculoskeletal: Right hip immobilized with pain with gentle rom Skin: warm and dry  LABS:  BMET Recent Labs  Lab 02/16/19 0629 02/17/19 1347 02/17/19 1956  NA 136 130* 132*  K 3.7 2.8* 2.5*  CL 92* 88* 92*  CO2 33* 25 24  BUN 5* 9 7  CREATININE 0.37* 0.51 0.35*  GLUCOSE 123* 109* 88    Electrolytes Recent Labs  Lab 02/16/19 0629 02/17/19 1347 02/17/19 1956  CALCIUM 8.3* 8.0* 7.7*  MG  --  0.7* 2.4  PHOS  --  3.7 3.9    CBC Recent Labs  Lab 02/17/19 0449 02/17/19 1347 02/17/19 1956  WBC 6.1 6.7 5.2  HGB 10.8* 10.8* 10.2*  HCT 32.0* 32.1* 30.0*  PLT 93* 91* 103*    Coag's No results for input(s): APTT, INR in the last 168 hours.  Sepsis  Markers No results for input(s): LATICACIDVEN, PROCALCITON, O2SATVEN in the last 168 hours.  ABG Recent Labs  Lab 02/17/19 1956  PHART 7.51*  PCO2ART 31*  PO2ART 100    Liver Enzymes Recent Labs  Lab 02/17/19 1347 02/17/19 1956  AST 53* 40  ALT 41 35  ALKPHOS 50 44  BILITOT 1.7* 1.6*  ALBUMIN 3.7 3.3*    Cardiac Enzymes No results for input(s): TROPONINI, PROBNP in the last 168 hours.  Glucose No results for input(s): GLUCAP in the last 168 hours.  Imaging No results found.   STUDIES:  none  CULTURES: none  ANTIBIOTICS: none  SIGNIFICANT EVENTS: 9/30> admitted 10/2> transferred to the ICU for precedex  LINES/TUBES:  PIVs  DISCUSSION: 58 y/o female presenting with acute ETOH withdrawal  ASSESSMENT  Acute ETOH withdrawal Right hip osteonecrosis s/p Total hip replacement Hypertension Asthma   PLAN Stepdown alcohol withdrawal protocol CIWA protocol PRN lorazepam Thiamine, folic acid  and multivitamin PRN bronchodilators Pain management with Norco Rest of the treatment plan per Ortho  Best Practice: Code Status: Full code Diet: N.p.o. until fully alert GI prophylaxis: Not indicated VTE prophylaxis: Xarelto and SCDs  FAMILY  - Updates: Family updated by admitting and orthopedic team.  Will update with any changes in treatment plan  Akshat Minehart S. Genoveva Ill ANP-BC Pulmonary and [place] Pager [phone] or [phone]  NB: This document was prepared using Dragon voice recognition software and may include unintentional dictation errors.    02/17/2019, 8:31 PM

## 2019-02-17 NOTE — Evaluation (Signed)
Occupational Therapy Evaluation Patient Details Name: Brenda [name] MRN: [id number] DOB: 01-21-1961 Today's Date: 02/17/2019    History of Present Illness [name] diagnosed with osteonecrosis right hip and is s/p elective R THA.  PMH includes COPD, asthma, HTN, and CHF.   Clinical Impression   [name] s/p R THA (anterior)  who lives at home alone. She has her sister her from [place] to help her for 2 weeks and concerned about her safety since she is still confused.  See comments below.  Pt was independent in all ADLs prior to surgery and is eager to return to PLOF.  Pt is currently limited in functional ADLs due to pain, decreased ROM, and confusion.  Pt requires mod assist and cues for LB dressing and bathing skills due to pain and decreased AROM of R LE with cues for impulsivity and confusion mainly which was discussed with NSG and PT and will also discuss with SW.  She would benefit from continued skilled OT services for education in assistive devices, functional mobility, and education in recommendations for home modifications to increase safety and prevent falls.  Pt is a good candidate for OT HH to continue rehabilitation and at this time needs 24 hour supervision for confusion.      Follow Up Recommendations  Home health OT;Supervision/Assistance - 24 hour(concerned about her confusion that has not resolved and unclear if this is baseline)    Equipment Recommendations       Recommendations for Other Services       Precautions / Restrictions Precautions Precautions: Anterior Hip Restrictions Weight Bearing Restrictions: Yes RLE Weight Bearing: Weight bearing as tolerated      Mobility Bed Mobility                  Transfers                      Balance                                           ADL either performed or assessed with clinical judgement   ADL Overall ADL's : Needs assistance/impaired Eating/Feeding:  Independent;Set up   Grooming: Wash/dry hands;Wash/dry face;Oral care;Set up;Sitting;Min guard Grooming Details (indicate cue type and reason): cues to stay focused on task and has tremors in B hands which she stated were since surgery. Upper Body Bathing: Set up;Min guard;Bed level   Lower Body Bathing: Moderate assistance;Set up;Bed level Lower Body Bathing Details (indicate cue type and reason): Pt was seen in bed only due to increased BP this morning and confusion appears to be worse.  NSG updated and expressed concerns about confusion. Upper Body Dressing : Independent;Set up;Bed level   Lower Body Dressing: Moderate assistance;Set up;With adaptive equipment;Bed level Lower Body Dressing Details (indicate cue type and reason): mod assist and cues to practice using AD in bed with assist to lift LLE               General ADL Comments: NO sitting EOB or standing due to high BP this morning and confusion appears to be worse and NSG notified as well as PT.  BP 140/95 and pulse 109 and sats 98% which were lower than earlier but pt very confused most of sesssion.     Vision Baseline Vision/History: Wears glasses Wears Glasses:  Reading only Patient Visual Report: No change from baseline       Perception     Praxis      Pertinent Vitals/Pain Pain Assessment: 0-10 Pain Score: 7  Pain Location: R hip Pain Descriptors / Indicators: Sore Pain Intervention(s): Limited activity within patient's tolerance;Monitored during session;Premedicated before session     Hand Dominance Right   Extremity/Trunk Assessment Upper Extremity Assessment Upper Extremity Assessment: Overall WFL for tasks assessed   Lower Extremity Assessment Lower Extremity Assessment: Defer to PT evaluation       Communication Communication Communication: No difficulties   Cognition Arousal/Alertness: Awake/alert Behavior During Therapy: (pt confused most of session and NSG and PT updated) Overall  Cognitive Status: Impaired/Different from baseline Area of Impairment: Awareness;Safety/judgement;Memory                     Memory: Decreased short-term memory   Safety/Judgement: Decreased awareness of safety Awareness: Intellectual   General Comments: Pt with increased confusion today and when lady came to get her breakfast tray she stated "I did not want to eat any more than the fruit and soda because I thought it was yours", insisted her shoes were on in bed and were not and kept saying she had an appt with Anderson Malta at [facility].  However, she knew she lived alone and her sister was here from [place] for 2 weeks to help her and knew she had hip surgery and why.   General Comments       Exercises     Shoulder Instructions      Home Living Family/patient expects to be discharged to:: Private residence Living Arrangements: Alone Available Help at Discharge: Family;Available 24 hours/day Type of Home: Apartment Home Access: Level entry     Home Layout: One level     Bathroom Shower/Tub: Teacher, early years/pre: Handicapped height     Home Equipment: Bedside commode;Walker - 2 wheels;Cane - quad   Additional Comments: Pt's sister is staying with the patient 24/7 as long as needed      Prior Functioning/Environment Level of Independence: Independent with assistive device(s)        Comments: Mod Ind amb with a RW for R hip pain control, works PT as Scientist, water quality at [employer], Ind with ADLs, no fall history        OT Problem List: Decreased strength;Decreased range of motion;Pain;Impaired balance (sitting and/or standing);Decreased activity tolerance      OT Treatment/Interventions: Self-care/ADL training;DME and/or AE instruction    OT Goals(Current goals can be found in the care plan section) Acute Rehab OT Goals Patient Stated Goal: To get my life back and to get back to work OT Goal Formulation: With patient Time For Goal Achievement:  03/03/19 Potential to Achieve Goals: Fair ADL Goals Pt Will Perform Lower Body Dressing: with set-up;with adaptive equipment;with min assist;sit to/from stand Pt Will Transfer to Toilet: with set-up;bedside commode;regular height toilet;with min assist  OT Frequency: Min 2X/week   Barriers to D/C:    was living at home alone prior to surgery and sister who is coming from [place] is only staying for first 2 weeks       Co-evaluation              AM-PAC OT "6 Clicks" Daily Activity     Outcome Measure Help from another person eating meals?: A Little Help from another person taking care of personal grooming?: A Little Help from another person toileting, which includes  using toliet, bedpan, or urinal?: A Lot Help from another person bathing (including washing, rinsing, drying)?: A Lot Help from another person to put on and taking off regular upper body clothing?: None Help from another person to put on and taking off regular lower body clothing?: A Lot 6 Click Score: 16   End of Session Nurse Communication: Other (comment)(updated about confusion and BP and concerned about DC home today due to confusion)  Activity Tolerance: Patient tolerated treatment well Patient left: in bed;with call bell/phone within reach;with bed alarm set  OT Visit Diagnosis: Unsteadiness on feet (R26.81);Other abnormalities of gait and mobility (R26.89);Pain Pain - Right/Left: Right Pain - part of body: Hip                Time: [id number] OT Time Calculation (min): 35 min Charges:  OT General Charges $OT Visit: 1 Visit OT Evaluation $OT Eval Low Complexity: 1 Low OT Treatments $Self Care/Home Management : 23-37 mins  Chrys Racer, OTR/L, [place] ascom [phone] 02/17/19, 11:03 AM

## 2019-02-17 NOTE — Discharge Summary (Signed)
Physician Discharge Summary  Patient ID: Brenda [name] MRN: [id number] DOB/AGE: [birth year] 57 y.o.  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 Anemia   . Arthritis   . Asthma   . Atypical pneumonia    a. 12/2013  . B12 deficiency 10/07/2014  . Clostridium difficile colitis    a. 12/2013.was sick with pneumonia and sepsis  . Hypertension   . Lower extremity edema     Surgeries: Procedure(s): TOTAL HIP ARTHROPLASTY ANTERIOR APPROACH on 02/15/2019   Consultants (if any):   Discharged Condition: Improved  Hospital Course: Brenda [name] is an 58 y.o. 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    She was given perioperative antibiotics:  Anti-infectives (From admission, onward)   Start     Dose/Rate Route Frequency Ordered Stop   02/15/19 1700  clindamycin (CLEOCIN) IVPB 600 mg     600 mg 100 mL/hr over 30 Minutes Intravenous Every 6 hours 02/15/19 1536 02/15/19 2245   02/15/19 1220  50,000 units bacitracin in 0.9% normal saline 250 mL irrigation  Status:  Discontinued       As needed 02/15/19 1220 02/15/19 1251   02/15/19 0845  clindamycin (CLEOCIN) 900 MG/50ML IVPB    Note to Pharmacy: Register, Liora   : cabinet override      02/15/19 0845 02/15/19 1055   02/15/19 0600  clindamycin (CLEOCIN) IVPB 900 mg     900 mg 100 mL/hr over 30 Minutes Intravenous On call to O.R. 02/14/19 2205 02/15/19 1055    .  She was given sequential compression devices, early ambulation, and Xarelto for DVT prophylaxis.  She benefited maximally from the hospital  stay and there were no complications.    Recent vital signs:  Vitals:   02/16/19 1558 02/16/19 2319  BP: 123/90 135/87  Pulse: 90 97  Resp:  20  Temp: 98.7 F (37.1 C) 98.2 F (36.8 C)  SpO2: 97% 96%    Recent laboratory studies:  Lab Results  Component Value Date   HGB 10.8 (L) 02/17/2019   HGB 11.2 (L) 02/16/2019   HGB 13.6 02/15/2019   Lab Results  Component Value Date   WBC 6.1 02/17/2019   PLT 93 (L) 02/17/2019   Lab Results  Component Value Date   INR 1.0 02/07/2019   Lab Results  Component Value Date   NA 136 02/16/2019   K 3.7 02/16/2019   CL 92 (L) 02/16/2019   CO2 33 (H) 02/16/2019   BUN 5 (L) 02/16/2019   CREATININE 0.37 (L) 02/16/2019   GLUCOSE 123 (H) 02/16/2019    Discharge Medications:   Allergies as of 02/17/2019      Reactions   Spiriva [tiotropium Bromide Monohydrate] Other (See Comments)   CLOSES THROAT   Shellfish Allergy Diarrhea, Nausea And Vomiting   Penicillins    DOES NOT KNOW WHAT TYPE OF REACTION   Patient was 58 years old when she had this   Codeine Nausea And Vomiting      Medication List    STOP taking these medications   aspirin 81 MG tablet   ibuprofen 100 MG/5ML suspension  Commonly known as: ADVIL     TAKE these medications   acetaminophen 325 MG tablet Commonly known as: TYLENOL Take 650 mg by mouth every 6 (six) hours as needed for moderate pain.   albuterol 108 (90 Base) MCG/ACT inhaler Commonly known as: VENTOLIN HFA Inhale 2 puffs into the lungs every 6 (six) hours as needed for wheezing or shortness of breath.   Biotin 10000 MCG Tabs Take 10,000 mcg by mouth daily.   calcium carbonate 1500 (600 Ca) MG Tabs tablet Commonly known as: OSCAL Take 600 mg of elemental calcium by mouth daily with breakfast.   cetirizine 10 MG tablet Commonly known as: ZYRTEC Take 10 mg by mouth daily.   Coenzyme Q10 100 MG Tabs Take 100 mg by mouth daily.   cyanocobalamin 1000 MCG/ML injection Commonly known as:  (VITAMIN B-12) Inject 1,000 mcg into the muscle See admin instructions. Inject 1000 mcg intramuscularly every 60 days   diclofenac sodium 1 % Gel Commonly known as: VOLTAREN Apply 2 g topically 3 (three) times daily as needed (pain).   docusate sodium 100 MG capsule Commonly known as: COLACE Take 1 capsule (100 mg total) by mouth 2 (two) times daily.   Fish Oil 1000 MG Caps Take 1,000 mg by mouth daily.   furosemide 20 MG tablet Commonly known as: LASIX Take 1 tablet (20 mg total) by mouth daily.   HYDROcodone-acetaminophen 5-325 MG tablet Commonly known as: NORCO/VICODIN Take 1-2 tablets by mouth every 4 (four) hours as needed for moderate pain (pain score 4-6).   methocarbamol 500 MG tablet Commonly known as: ROBAXIN Take 1 tablet (500 mg total) by mouth every 6 (six) hours as needed for muscle spasms.   metoprolol tartrate 100 MG tablet Commonly known as: LOPRESSOR Take 100 mg by mouth every evening.   MULTIVITAMIN GUMMIES ADULT PO Take 1 tablet by mouth daily.   Potassium Gluconate 550 (90 K) MG Tabs Take 550 mg by mouth daily.   PROBIOTIC & ACIDOPHILUS EX ST PO Take 1 capsule by mouth daily.   rivaroxaban 10 MG Tabs tablet Commonly known as: XARELTO Take 1 tablet (10 mg total) by mouth daily with breakfast. May continue aspirin once xarelto discontinued   traMADol 50 MG tablet Commonly known as: ULTRAM Take 1 tablet (50 mg total) by mouth every 6 (six) hours as needed.   Vitamin D (Ergocalciferol) 1.25 MG (50000 UT) Caps capsule Commonly known as: DRISDOL Take 50,000 Units by mouth See admin instructions. Take 50000 units by mouth every 60 days with b12 injection            Durable Medical Equipment  (From admission, onward)         Start     Ordered   02/15/19 1537  DME Walker rolling  Once    Question:  Patient needs a walker to treat with the following condition  Answer:  Status post total hip replacement, right   02/15/19 1536   02/15/19 1537   DME 3 n 1  Once     02/15/19 1536   02/15/19 1537  DME Bedside commode  Once    Question:  Patient needs a bedside commode to treat with the following condition  Answer:  Status post THR (total hip replacement)   02/15/19 1536          Diagnostic Studies: X-ray Chest Pa Or Ap  Result Date: 02/15/2019 CLINICAL DATA:  Pre-procedure.  Shortness of breath EXAM: CHEST  1 VIEW COMPARISON:  01/20/2019 FINDINGS:  Minimal bibasilar atelectasis. Heart is normal size. No effusions. No acute bony abnormality. IMPRESSION: Minimal bibasilar atelectasis. Electronically Signed   By: Rolm Baptise M.D.   On: 02/15/2019 09:36   Dg Chest 2 View  Result Date: 01/20/2019 CLINICAL DATA:  Obstructive chronic bronchitis without exacerbation. Hypertension. Smoker. Pre operative respiratory exam. EXAM: CHEST - 2 VIEW COMPARISON:  01/31/2014 FINDINGS: Heart size and pulmonary vascularity are normal. Lungs are clear except for small calcified granuloma in the left upper lobe. No effusion. No significant bone abnormality. IMPRESSION: No active cardiopulmonary disease. Electronically Signed   By: Lorriane Shire M.D.   On: 01/20/2019 15:24   Dg Hip Operative Unilat W Or W/o Pelvis Right  Result Date: 02/15/2019 CLINICAL DATA:  Hip replacement EXAM: OPERATIVE RIGHT HIP (WITH PELVIS IF PERFORMED) 2 VIEWS TECHNIQUE: Fluoroscopic spot image(s) were submitted for interpretation post-operatively. COMPARISON:  December 18, 2018 FINDINGS: The patient has undergone total hip arthroplasty on the right. The alignment appears near anatomic. There are expected postsurgical changes including subcutaneous gas and overlying soft tissue edema. There is no evidence for hardware fracture or failure. IMPRESSION: Status post right total hip arthroplasty without evidence for immediate hardware complication. Electronically Signed   By: Constance Holster M.D.   On: 02/15/2019 12:41    Disposition: Discharge disposition: 01-Home or Self  Care            Signed: Carlynn Spry ,PA-C 02/17/2019, 6:53 AM

## 2019-02-17 NOTE — Progress Notes (Signed)
PHARMACY CONSULT NOTE - FOLLOW UP  Pharmacy Consult for Electrolyte Monitoring and Replacement   Recent Labs: Potassium (mmol/L)  Date Value  02/17/2019 2.8 (L)  01/19/2014 3.6   Magnesium (mg/dL)  Date Value  02/17/2019 0.7 (LL)  12/28/2013 1.8   Calcium (mg/dL)  Date Value  02/17/2019 8.0 (L)   Calcium, Total (mg/dL)  Date Value  01/19/2014 8.1 (L)   Albumin (g/dL)  Date Value  02/17/2019 3.7   Phosphorus (mg/dL)  Date Value  02/17/2019 3.7  12/28/2013 3.5   Sodium (mmol/L)  Date Value  02/17/2019 130 (L)  01/19/2014 141     Assessment: Patient is a 58yo female POD#2 for osteonecrosis of right hip. Patient developed confusion. Electrolytes are out of range, pharmacy consulted for replacement.  Plan:  Will replace with KCl 1mEq IV times 4 runs and Mag Sulfate 4g IV once. Will recheck K and Mag ~6 hours after Mag Sulfate replacement is completed. Will follow up on further replacement and follow up labs.  Paulina Fusi, PharmD, BCPS 02/17/2019 4:25 PM

## 2019-02-17 NOTE — Discharge Instructions (Signed)

## 2019-02-17 NOTE — Progress Notes (Signed)
  Subjective:  Patient reports pain as mild.  Patient confused this morning. She repeatedly kept thinking she was at home and not in the hospital.  Nurse says no narcotics given last night when confusion started.  Objective:   VITALS:   Vitals:   02/16/19 0401 02/16/19 0737 02/16/19 1558 02/16/19 2319  BP: 124/88 (!) 140/93 123/90 135/87  Pulse: 75 84 90 97  Resp:  16  20  Temp:  98.6 F (37 C) 98.7 F (37.1 C) 98.2 F (36.8 C)  TempSrc:   Oral   SpO2:  98% 97% 96%  Weight:      Height:        PHYSICAL EXAM:  Neurologically intact ABD soft Neurovascular intact Sensation intact distally Intact pulses distally Dorsiflexion/Plantar flexion intact Incision: scant drainage and dressing changed No cellulitis present Compartment soft  LABS  Results for orders placed or performed during the hospital encounter of 02/15/19 (from the past 24 hour(s))  CBC     Status: Abnormal   Collection Time: 02/17/19  4:49 AM  Result Value Ref Range   WBC 6.1 4.0 - 10.5 K/uL   RBC 3.13 (L) 3.87 - 5.11 MIL/uL   Hemoglobin 10.8 (L) 12.0 - 15.0 g/dL   HCT 32.0 (L) 36.0 - 46.0 %   MCV 102.2 (H) 80.0 - 100.0 fL   MCH 34.5 (H) 26.0 - 34.0 pg   MCHC 33.8 30.0 - 36.0 g/dL   RDW 12.1 11.5 - 15.5 %   Platelets 93 (L) 150 - 400 K/uL   nRBC 0.0 0.0 - 0.2 %    X-ray Chest Pa Or Ap  Result Date: 02/15/2019 CLINICAL DATA:  Pre-procedure.  Shortness of breath EXAM: CHEST  1 VIEW COMPARISON:  01/20/2019 FINDINGS: Minimal bibasilar atelectasis. Heart is normal size. No effusions. No acute bony abnormality. IMPRESSION: Minimal bibasilar atelectasis. Electronically Signed   By: Rolm Baptise M.D.   On: 02/15/2019 09:36   Dg Hip Operative Unilat W Or W/o Pelvis Right  Result Date: 02/15/2019 CLINICAL DATA:  Hip replacement EXAM: OPERATIVE RIGHT HIP (WITH PELVIS IF PERFORMED) 2 VIEWS TECHNIQUE: Fluoroscopic spot image(s) were submitted for interpretation post-operatively. COMPARISON:  December 18, 2018  FINDINGS: The patient has undergone total hip arthroplasty on the right. The alignment appears near anatomic. There are expected postsurgical changes including subcutaneous gas and overlying soft tissue edema. There is no evidence for hardware fracture or failure. IMPRESSION: Status post right total hip arthroplasty without evidence for immediate hardware complication. Electronically Signed   By: Constance Holster M.D.   On: 02/15/2019 12:41    Assessment/Plan: 2 Days Post-Op   Active Problems:   Osteonecrosis of right hip ([place])   Advance diet Up with therapy Discharge home with home health if confusion resolved   Brenda [name] , MD 02/17/2019, 6:44 AM

## 2019-02-17 NOTE — Progress Notes (Signed)
PT Cancellation Note  Patient Details Name: Brenda [name] MRN: [id number] DOB: 03-28-1961   Cancelled Treatment:    Reason Eval/Treat Not Completed: Medical issues which prohibited therapy.  Per chart review pt sedated at this time with CIWA protocol initiated.  Pt also has recent Ka level of 2.8 and trending down falling outside guidelines for participation with PT services.  Will attempt to see pt at at future date/time as medically appropriate.     Linus Salmons PT, DPT 02/17/19, 4:44 PM

## 2019-02-17 NOTE — TOC Progression Note (Signed)
Transition of Care [facility]) - Progression Note    Patient Details  Name: Brenda [name] MRN: [id number] Date of Birth: 1960/09/29  Transition of Care [facility]) CM/SW [place], RN Phone Number: 02/17/2019, 11:52 AM  Clinical Narrative:    The patient is increasingly confused the bedside nurse is contacting the Physician to inquire about cancelling the DC   Expected Discharge Plan: [place] Barriers to Discharge: Continued Medical Work up  Expected Discharge Plan and Services Expected Discharge Plan: [place]   Discharge Planning Services: CM Consult Post Acute Care Choice: [place] arrangements for the past 2 months: Single Family Home Expected Discharge Date: 02/17/19               DME Arranged: N/A         HH Arranged: PT HH Agency: [facility] [place] Date [place]: 02/16/19 Time [place]: 1011 Representative spoke with at [place]: [place] ([place]) Interventions    Readmission Risk Interventions No flowsheet data found.

## 2019-02-17 NOTE — Progress Notes (Signed)
Patient continued to have severe withdrawal symptoms with CIWA score of 34, continue to have severe tachycardia, hallucinations, patient will be transferred to stepdown for Precedex infusion.  Spoke with nurse Beverlee Nims and also Anderson Malta.  Spoke with ICU nurse Charlie.

## 2019-02-17 NOTE — Progress Notes (Signed)
[place] Progress Note Patient Name: Brenda [name] DOB: 09-12-1960 MRN: [id number]   Date of Service  02/17/2019  HPI/Events of Note  61 F underwent elective right THA went into ETOH withdrawal. Hypokalemia and hypomagnesemia on workup.  eICU Interventions  On CIWA protocol and Prcedex Correcting electrolytes Pain controlled     Intervention Category Major Interventions: Electrolyte abnormality - evaluation and management;Infection - evaluation and management Minor Interventions: Agitation / anxiety - evaluation and management Evaluation Type: New Patient Evaluation  Judd Lien 02/17/2019, 9:10 PM

## 2019-02-17 NOTE — Progress Notes (Signed)
Pt very agitated this entire shift. Began prior to shift change. Per shift change report Pt was moved to 151 from another room for the same reason because of safety. Conversation with Ortho PA approx 8ish in am regarding uncontrolled HR. Verbal order to give metoprolol early. HR continued to be in 120s, then recheck HR 40. Pt with extreme tremors, profuse sweating, strong hallucinations, confusion and agitation. PA conversation prior to their off campus surgery. Pt continued with same symptoms and denying drinking alcohol on a daily basis. Several attempts to reach primary Ortho MD, left vvm to reach out to writer between cases. Pt continued to decline.Prime Doc paged with new orders for CIWA. 4mg  Ativan given for CIWA score of 34. Upon reassessment of Pt without any change. Rapid response called.

## 2019-02-18 LAB — COMPREHENSIVE METABOLIC PANEL
ALT: 32 U/L (ref 0–44)
AST: 38 U/L (ref 15–41)
Albumin: 3.1 g/dL — ABNORMAL LOW (ref 3.5–5.0)
Alkaline Phosphatase: 48 U/L (ref 38–126)
Anion gap: 12 (ref 5–15)
BUN: 8 mg/dL (ref 6–20)
CO2: 26 mmol/L (ref 22–32)
Calcium: 7.6 mg/dL — ABNORMAL LOW (ref 8.9–10.3)
Chloride: 97 mmol/L — ABNORMAL LOW (ref 98–111)
Creatinine, Ser: 0.46 mg/dL (ref 0.44–1.00)
GFR calc Af Amer: >60 mL/min (ref >60)
GFR calc non Af Amer: >60 mL/min (ref >60)
Glucose, Bld: 86 mg/dL (ref 70–99)
Potassium: 3.4 mmol/L — ABNORMAL LOW (ref 3.5–5.1)
Sodium: 135 mmol/L (ref 135–145)
Total Bilirubin: 1.4 mg/dL — ABNORMAL HIGH (ref 0.3–1.2)
Total Protein: 6.1 g/dL — ABNORMAL LOW (ref 6.5–8.1)

## 2019-02-18 LAB — PHOSPHORUS: Phosphorus: 3.4 mg/dL (ref 2.5–4.6)

## 2019-02-18 LAB — POTASSIUM: Potassium: 3 mmol/L — ABNORMAL LOW (ref 3.5–5.1)

## 2019-02-18 LAB — MAGNESIUM
Magnesium: 1.8 mg/dL (ref 1.7–2.4)
Magnesium: 2 mg/dL (ref 1.7–2.4)

## 2019-02-18 MED ORDER — MAGNESIUM SULFATE 2 GM/50ML IV SOLN
2.0000 g | Freq: Once | INTRAVENOUS | Status: AC
  Administered 2019-02-18: 2 g via INTRAVENOUS
  Filled 2019-02-18: qty 50

## 2019-02-18 MED ORDER — POTASSIUM CHLORIDE 20 MEQ PO PACK
40.0000 meq | PACK | Freq: Once | ORAL | Status: AC
  Administered 2019-02-18: 40 meq via ORAL
  Filled 2019-02-18: qty 2

## 2019-02-18 NOTE — Progress Notes (Signed)
OT Cancellation Note  Patient Details Name: Brenda [name] MRN: [id number] DOB: [birth year]   Cancelled Treatment:    Reason Eval/Treat Not Completed: Medical issues which prohibited therapy;Other (comment) Pt t/f'ed to ICU d/t change in medical status. Will complete order and await new order as pt becomes more appropriate for therapy.   Jake Church Shyann Hefner 02/18/2019, 11:10 AM

## 2019-02-18 NOTE — Progress Notes (Signed)
Received patient from CCU in bed, IVF infusing, tele monitor in place

## 2019-02-18 NOTE — Progress Notes (Signed)
PT Cancellation Note  Patient Details Name: Brenda [name] MRN: [id number] DOB: [birth year]   Cancelled Treatment:    Reason Eval/Treat Not Completed: Other (comment);Medical issues which prohibited therapy(Patient change in medical status. Code yesterday 10/2 and transferred to ICU. Due to change in medical status patient will require new PT order once appropriate for care.)  Janna Arch, PT, DPT   02/18/2019, 11:09 AM

## 2019-02-18 NOTE — Progress Notes (Addendum)
PHARMACY CONSULT NOTE - FOLLOW UP  Pharmacy Consult for Electrolyte Monitoring and Replacement   Recent Labs: Potassium (mmol/L)  Date Value  02/18/2019 3.4 (L)  01/19/2014 3.6   Magnesium (mg/dL)  Date Value  02/18/2019 2.0  12/28/2013 1.8   Calcium (mg/dL)  Date Value  02/18/2019 7.6 (L)   Calcium, Total (mg/dL)  Date Value  01/19/2014 8.1 (L)   Albumin (g/dL)  Date Value  02/18/2019 3.1 (L)   Phosphorus (mg/dL)  Date Value  02/17/2019 3.9  12/28/2013 3.5   Sodium (mmol/L)  Date Value  02/18/2019 135  01/19/2014 141   Assessment: Patient is a 58yo female POD#2 for osteonecrosis of right hip. Patient developed confusion. Electrolytes are out of range, pharmacy consulted for replacement.  Mg is WNL.  K+ remains slightly low but has improved w/ KCL runs.  Mg = 1.8.  Plan:  KCL 29mEq packet x 1 dose now Will recheck K and Mag in am  Will follow up on further replacement  Hart Robinsons, PharmD 02/18/2019 5:40 AM

## 2019-02-18 NOTE — Progress Notes (Signed)
PHARMACY CONSULT NOTE - FOLLOW UP  Pharmacy Consult for Electrolyte Monitoring and Replacement   Recent Labs: Potassium (mmol/L)  Date Value  02/18/2019 3.0 (L)  01/19/2014 3.6   Magnesium (mg/dL)  Date Value  02/18/2019 2.0  12/28/2013 1.8   Calcium (mg/dL)  Date Value  02/17/2019 7.7 (L)   Calcium, Total (mg/dL)  Date Value  01/19/2014 8.1 (L)   Albumin (g/dL)  Date Value  02/17/2019 3.3 (L)   Phosphorus (mg/dL)  Date Value  02/17/2019 3.9  12/28/2013 3.5   Sodium (mmol/L)  Date Value  02/17/2019 132 (L)  01/19/2014 141   Assessment: Patient is a 58yo female POD#2 for osteonecrosis of right hip. Patient developed confusion. Electrolytes are out of range, pharmacy consulted for replacement.  Mg is WNL.  K+ remains low.  Plan:  Pt currently receiving KCl 85mEq IV times 6 runs which started at 10pm (per MD order) Will recheck K and Mag in am after runs completed.   Will follow up on further replacement and follow up labs.  Hart Robinsons, PharmD 02/18/2019 12:34 AM

## 2019-02-18 NOTE — Progress Notes (Addendum)
Pharmacy Electrolyte Monitoring Consult:  Pharmacy consulted to assist in monitoring and replacing electrolytes in this 58 y.o. female admitted on 02/15/2019. Patient is POD 3 for osteonecrosis of right hip. Patient with history anemia, arthritis, asthma, B12 deficiency, hypertension, and edema.   Labs:  Sodium (mmol/L)  Date Value  02/18/2019 135  01/19/2014 141   Potassium (mmol/L)  Date Value  02/18/2019 3.4 (L)  01/19/2014 3.6   Magnesium (mg/dL)  Date Value  02/18/2019 1.8  12/28/2013 1.8   Phosphorus (mg/dL)  Date Value  02/18/2019 3.4  12/28/2013 3.5   Calcium (mg/dL)  Date Value  02/18/2019 7.6 (L)   Calcium, Total (mg/dL)  Date Value  01/19/2014 8.1 (L)   Albumin (g/dL)  Date Value  02/18/2019 3.1 (L)    Assessment/Plan: Patient currently on LR at 74mL/hr and furosemide 20mg  PO Daily. Patient received potassium 40mg  PO x 1 this am. Will order magnesium 2g IV x 1.   Will obtain BMP/Magnesium with am labs.   Will replace to maintain electrolytes within normal limits.   Pharmacy will continue to monitor and adjust per consult.   Simpson,Michael L 02/18/2019 11:30 AM

## 2019-02-18 NOTE — Progress Notes (Signed)
Subjective:  POD #3 s/p right total hip arthroplasty.   Patient reports right hip pain as mild to moderate.  Patient transferred to the ICU overnight for alcohol withdrawal symptoms of severe tachycardia and hallucinations.  Patient is improved and is off Precedex.  Patient being transferred back to the floor.  Objective:   VITALS:   Vitals:   02/18/19 0900 02/18/19 1000 02/18/19 1100 02/18/19 1200  BP: 99/74 (!) 107/91 110/75 102/77  Pulse: 75 78 69 70  Resp: (!) 22 (!) 25 (!) 21 17  Temp:    98 F (36.7 C)  TempSrc:      SpO2: 95% 95% 93% 93%  Weight:      Height:        PHYSICAL EXAM: Right lower extremity Neurovascular intact Sensation intact distally Intact pulses distally Dorsiflexion/Plantar flexion intact Incision: moderate drainage No cellulitis present Compartment soft  LABS  Results for orders placed or performed during the hospital encounter of 02/15/19 (from the past 24 hour(s))  Comprehensive metabolic panel     Status: Abnormal   Collection Time: 02/17/19  7:56 PM  Result Value Ref Range   Sodium 132 (L) 135 - 145 mmol/L   Potassium 2.5 (LL) 3.5 - 5.1 mmol/L   Chloride 92 (L) 98 - 111 mmol/L   CO2 24 22 - 32 mmol/L   Glucose, Bld 88 70 - 99 mg/dL   BUN 7 6 - 20 mg/dL   Creatinine, Ser 0.35 (L) 0.44 - 1.00 mg/dL   Calcium 7.7 (L) 8.9 - 10.3 mg/dL   Total Protein 6.3 (L) 6.5 - 8.1 g/dL   Albumin 3.3 (L) 3.5 - 5.0 g/dL   AST 40 15 - 41 U/L   ALT 35 0 - 44 U/L   Alkaline Phosphatase 44 38 - 126 U/L   Total Bilirubin 1.6 (H) 0.3 - 1.2 mg/dL   GFR calc non Af Amer >60 >60 mL/min   GFR calc Af Amer >60 >60 mL/min   Anion gap 16 (H) 5 - 15  Magnesium     Status: None   Collection Time: 02/17/19  7:56 PM  Result Value Ref Range   Magnesium 2.4 1.7 - 2.4 mg/dL  Phosphorus     Status: None   Collection Time: 02/17/19  7:56 PM  Result Value Ref Range   Phosphorus 3.9 2.5 - 4.6 mg/dL  CBC     Status: Abnormal   Collection Time: 02/17/19  7:56 PM   Result Value Ref Range   WBC 5.2 4.0 - 10.5 K/uL   RBC 2.92 (L) 3.87 - 5.11 MIL/uL   Hemoglobin 10.2 (L) 12.0 - 15.0 g/dL   HCT 30.0 (L) 36.0 - 46.0 %   MCV 102.7 (H) 80.0 - 100.0 fL   MCH 34.9 (H) 26.0 - 34.0 pg   MCHC 34.0 30.0 - 36.0 g/dL   RDW 12.3 11.5 - 15.5 %   Platelets 103 (L) 150 - 400 K/uL   nRBC 0.0 0.0 - 0.2 %  Blood gas, arterial     Status: Abnormal (Preliminary result)   Collection Time: 02/17/19  7:56 PM  Result Value Ref Range   pH, Arterial 7.51 (H) 7.350 - 7.450   pCO2 arterial 31 (L) 32.0 - 48.0 mmHg   pO2, Arterial 100 83.0 - 108.0 mmHg   Bicarbonate 24.7 20.0 - 28.0 mmol/L   Acid-Base Excess 2.3 (H) 0.0 - 2.0 mmol/L   O2 Saturation 98.3 %   Patient temperature 37.0    Collection  site LEFT RADIAL    Sample type ARTERIAL DRAW    Allens test (pass/fail) PASS PASS   Mechanical Rate PENDING   Potassium     Status: Abnormal   Collection Time: 02/18/19 12:03 AM  Result Value Ref Range   Potassium 3.0 (L) 3.5 - 5.1 mmol/L  Magnesium     Status: None   Collection Time: 02/18/19 12:03 AM  Result Value Ref Range   Magnesium 2.0 1.7 - 2.4 mg/dL  Magnesium     Status: None   Collection Time: 02/18/19  4:35 AM  Result Value Ref Range   Magnesium 1.8 1.7 - 2.4 mg/dL  Phosphorus     Status: None   Collection Time: 02/18/19  4:35 AM  Result Value Ref Range   Phosphorus 3.4 2.5 - 4.6 mg/dL  Comprehensive metabolic panel     Status: Abnormal   Collection Time: 02/18/19  4:35 AM  Result Value Ref Range   Sodium 135 135 - 145 mmol/L   Potassium 3.4 (L) 3.5 - 5.1 mmol/L   Chloride 97 (L) 98 - 111 mmol/L   CO2 26 22 - 32 mmol/L   Glucose, Bld 86 70 - 99 mg/dL   BUN 8 6 - 20 mg/dL   Creatinine, Ser 0.46 0.44 - 1.00 mg/dL   Calcium 7.6 (L) 8.9 - 10.3 mg/dL   Total Protein 6.1 (L) 6.5 - 8.1 g/dL   Albumin 3.1 (L) 3.5 - 5.0 g/dL   AST 38 15 - 41 U/L   ALT 32 0 - 44 U/L   Alkaline Phosphatase 48 38 - 126 U/L   Total Bilirubin 1.4 (H) 0.3 - 1.2 mg/dL   GFR calc  non Af Amer >60 >60 mL/min   GFR calc Af Amer >60 >60 mL/min   Anion gap 12 5 - 15    No results found.  Assessment/Plan: 3 Days Post-Op   Active Problems:   Osteonecrosis of right hip ([place])  Patient is improved in regards to alcohol withdrawal symptoms.  Continue CIWA protocol.  Appreciate hospitalist service management.  Continue physical therapy as medically appropriate.  Patient on Xarelto for DVT prophylaxis.    Thornton Park , MD 02/18/2019, 1:52 PM

## 2019-02-18 NOTE — Progress Notes (Addendum)
[place] [facility] [place] NAME: Brenda [name]    MR#:  [id number]  DATE OF BIRTH:  07-07-1960  SUBJECTIVE:  CHIEF COMPLAINT: Patient is a still having tremors but feels better Off Precedex drip  REVIEW OF SYSTEMS:  CONSTITUTIONAL: No fever, fatigue or weakness.  EYES: No blurred or double vision.  EARS, NOSE, AND THROAT: No tinnitus or ear pain.  RESPIRATORY: No cough, shortness of breath, wheezing or hemoptysis.  CARDIOVASCULAR: No chest pain, orthopnea, edema.  GASTROINTESTINAL: No nausea, vomiting, diarrhea or abdominal pain.  GENITOURINARY: No dysuria, hematuria.  ENDOCRINE: No polyuria, nocturia,  HEMATOLOGY: No anemia, easy bruising or bleeding SKIN: No rash or lesion. MUSCULOSKELETAL: No joint pain or arthritis.   NEUROLOGIC: No tingling, numbness, weakness.  Jittery PSYCHIATRY: no  anxiety or depression.   DRUG ALLERGIES:   Allergies  Allergen Reactions  . Spiriva [Tiotropium Bromide Monohydrate] Other (See Comments)    CLOSES THROAT  . Shellfish Allergy Diarrhea and Nausea And Vomiting  . Penicillins     DOES NOT KNOW WHAT TYPE OF REACTION   Patient was 58 years old when she had this  . Codeine Nausea And Vomiting    VITALS:  Blood pressure 102/77, pulse 70, temperature 98 F (36.7 C), resp. rate 17, height 5\' 4"  (1.626 m), weight 81.6 kg, SpO2 93 %.  PHYSICAL EXAMINATION:  GENERAL:  58 y.o.-year-old patient lying in the bed with no acute distress.  EYES: Pupils equal, round, reactive to light and accommodation. No scleral icterus. Extraocular muscles intact.  HEENT: Head atraumatic, normocephalic. Oropharynx and nasopharynx clear.  NECK:  Supple, no jugular venous distention. No thyroid enlargement, no tenderness.  LUNGS: Normal breath sounds bilaterally, no wheezing, rales,rhonchi or crepitation. No use of accessory muscles of respiration.  CARDIOVASCULAR: S1, S2 normal. No murmurs, rubs, or gallops.  ABDOMEN: Soft,  nontender, nondistended. Bowel sounds present EXTREMITIES: Right hip with dressing no pedal edema, cyanosis, or clubbing.  NEUROLOGIC: Cranial nerves II through XII are intact. Muscle strength generalized weakness in all extremities. Sensation intact. Gait not checked for safety.  Positive tremors PSYCHIATRIC: The patient is alert and oriented x 3.  SKIN: No obvious rash, lesion, or ulcer.    LABORATORY PANEL:   CBC Recent Labs  Lab 02/17/19 1956  WBC 5.2  HGB 10.2*  HCT 30.0*  PLT 103*   ------------------------------------------------------------------------------------------------------------------  Chemistries  Recent Labs  Lab 02/18/19 0435  NA 135  K 3.4*  CL 97*  CO2 26  GLUCOSE 86  BUN 8  CREATININE 0.46  CALCIUM 7.6*  MG 1.8  AST 38  ALT 32  ALKPHOS 48  BILITOT 1.4*   ------------------------------------------------------------------------------------------------------------------  Cardiac Enzymes No results for input(s): TROPONINI in the last 168 hours. ------------------------------------------------------------------------------------------------------------------  RADIOLOGY:  No results found.  EKG:   Orders placed or performed in visit on 01/20/19  . EKG 12-Lead  . EKG 12-Lead  . EKG 12-Lead    ASSESSMENT AND PLAN:    #Alcohol withdrawal with severe sinus tachycardia and tremors CIWA protocol implemented She is off Precedex drip Clinically improving  #Severe hypokalemia and hypomagnesemia Replete electrolytes per protocol Pharmacy is following  #Right hip osteonecrosis status post right hip surgery on September 30 Pain management as needed Postop care by orthopedics DVT prophylaxis with Xarelto  #Hyponatremia from alcoholism Resolved sodium is at 135 today   Discussed with intensivist  All the records are reviewed and case discussed with Care Management/Social Workerr. Management plans discussed with  the patient, she is  in  agreement.  CODE STATUS: fc   TOTAL TIME TAKING CARE OF THIS PATIENT: 35  minutes.   POSSIBLE D/C IN 2-3  DAYS, DEPENDING ON CLINICAL CONDITION.  Note: This dictation was prepared with Dragon dictation along with smaller phrase technology. Any transcriptional errors that result from this process are unintentional.   Nicholes Mango M.D on 02/18/2019 at 1:28 PM  Between 7am to 6pm - Pager - [phone] After 6pm go to [email or web address] - password [email or web address] [facility]  Office  [phone]  CC: Primary care physician; Elgie Collard, MD

## 2019-02-19 LAB — BASIC METABOLIC PANEL
Anion gap: 10 (ref 5–15)
BUN: 10 mg/dL (ref 6–20)
CO2: 26 mmol/L (ref 22–32)
Calcium: 7.8 mg/dL — ABNORMAL LOW (ref 8.9–10.3)
Chloride: 98 mmol/L (ref 98–111)
Creatinine, Ser: 0.52 mg/dL (ref 0.44–1.00)
GFR calc Af Amer: >60 mL/min (ref >60)
GFR calc non Af Amer: >60 mL/min (ref >60)
Glucose, Bld: 98 mg/dL (ref 70–99)
Potassium: 3.1 mmol/L — ABNORMAL LOW (ref 3.5–5.1)
Sodium: 134 mmol/L — ABNORMAL LOW (ref 135–145)

## 2019-02-19 LAB — MAGNESIUM: Magnesium: 1.7 mg/dL (ref 1.7–2.4)

## 2019-02-19 LAB — GLUCOSE, CAPILLARY: Glucose-Capillary: 73 mg/dL (ref 70–99)

## 2019-02-19 MED ORDER — POTASSIUM CHLORIDE IN NACL 20-0.9 MEQ/L-% IV SOLN
INTRAVENOUS | Status: DC
  Administered 2019-02-19 – 2019-02-20 (×2): via INTRAVENOUS
  Filled 2019-02-19 (×2): qty 1000

## 2019-02-19 MED ORDER — MAGNESIUM SULFATE 2 GM/50ML IV SOLN
2.0000 g | Freq: Once | INTRAVENOUS | Status: AC
  Administered 2019-02-19: 2 g via INTRAVENOUS
  Filled 2019-02-19: qty 50

## 2019-02-19 MED ORDER — POTASSIUM CHLORIDE CRYS ER 20 MEQ PO TBCR
40.0000 meq | EXTENDED_RELEASE_TABLET | ORAL | Status: AC
  Administered 2019-02-19 (×2): 40 meq via ORAL
  Filled 2019-02-19 (×2): qty 2

## 2019-02-19 MED ORDER — METOPROLOL SUCCINATE ER 50 MG PO TB24
100.0000 mg | ORAL_TABLET | Freq: Every day | ORAL | Status: DC
  Administered 2019-02-19 – 2019-02-20 (×2): 100 mg via ORAL
  Filled 2019-02-19 (×2): qty 2

## 2019-02-19 NOTE — Progress Notes (Signed)
[place] [facility] [place] NAME: Brenda [name]    MR#:  [id number]  DATE OF BIRTH:  10-Jan-1961  SUBJECTIVE:  CHIEF COMPLAINT: Patient is tachycardic otherwise feels fine and wants to go home .CIWA-0.  Okay to discharge patient from orthopedic standpoint Off Precedex drip  REVIEW OF SYSTEMS:  CONSTITUTIONAL: No fever, fatigue or weakness.  EYES: No blurred or double vision.  EARS, NOSE, AND THROAT: No tinnitus or ear pain.  RESPIRATORY: No cough, shortness of breath, wheezing or hemoptysis.  CARDIOVASCULAR: No chest pain, orthopnea, edema.  GASTROINTESTINAL: No nausea, vomiting, diarrhea or abdominal pain.  GENITOURINARY: No dysuria, hematuria.  ENDOCRINE: No polyuria, nocturia,  HEMATOLOGY: No anemia, easy bruising or bleeding SKIN: No rash or lesion. MUSCULOSKELETAL: Some right hip pain NEUROLOGIC: No tingling, numbness, weakness.  Jittery PSYCHIATRY: no  anxiety or depression.   DRUG ALLERGIES:   Allergies  Allergen Reactions  . Spiriva [Tiotropium Bromide Monohydrate] Other (See Comments)    CLOSES THROAT  . Shellfish Allergy Diarrhea and Nausea And Vomiting  . Penicillins     DOES NOT KNOW WHAT TYPE OF REACTION   Patient was 58 years old when she had this  . Codeine Nausea And Vomiting    VITALS:  Blood pressure (!) 143/97, pulse (!) 137, temperature 98.3 F (36.8 C), resp. rate 17, height 5\' 4"  (1.626 m), weight 81.6 kg, SpO2 96 %.  PHYSICAL EXAMINATION:  GENERAL:  58 y.o.-year-old patient lying in the bed with no acute distress.  EYES: Pupils equal, round, reactive to light and accommodation. No scleral icterus. Extraocular muscles intact.  HEENT: Head atraumatic, normocephalic. Oropharynx and nasopharynx clear.  NECK:  Supple, no jugular venous distention. No thyroid enlargement, no tenderness.  LUNGS: Normal breath sounds bilaterally, no wheezing, rales,rhonchi or crepitation. No use of accessory muscles of respiration.   CARDIOVASCULAR: S1, S2 normal. No murmurs, rubs, or gallops.  ABDOMEN: Soft, nontender, nondistended. Bowel sounds present EXTREMITIES: Right hip with dressing no pedal edema, cyanosis, or clubbing.  NEUROLOGIC: Cranial nerves II through XII are intact. Muscle strength generalized weakness in all extremities. Sensation intact. Gait not checked for safety.  Positive tremors PSYCHIATRIC: The patient is alert and oriented x 3.  SKIN: No obvious rash, lesion, or ulcer.    LABORATORY PANEL:   CBC Recent Labs  Lab 02/17/19 1956  WBC 5.2  HGB 10.2*  HCT 30.0*  PLT 103*   ------------------------------------------------------------------------------------------------------------------  Chemistries  Recent Labs  Lab 02/18/19 0435 02/19/19 0541  NA 135 134*  K 3.4* 3.1*  CL 97* 98  CO2 26 26  GLUCOSE 86 98  BUN 8 10  CREATININE 0.46 0.52  CALCIUM 7.6* 7.8*  MG 1.8 1.7  AST 38  --   ALT 32  --   ALKPHOS 48  --   BILITOT 1.4*  --    ------------------------------------------------------------------------------------------------------------------  Cardiac Enzymes No results for input(s): TROPONINI in the last 168 hours. ------------------------------------------------------------------------------------------------------------------  RADIOLOGY:  No results found.  EKG:   Orders placed or performed in visit on 01/20/19  . EKG 12-Lead  . EKG 12-Lead  . EKG 12-Lead    ASSESSMENT AND PLAN:  #Sinus tachycardia Hydrate with IV fluids Patient is on metoprolol 100 mg once daily and blood pressure being low cannot increase the dose of metoprolol  #Alcohol withdrawal with severe sinus tachycardia and tremors CIWA protocol implemented today CIWA score is 0 She is off Precedex drip Clinically improving Outpatient alcohol Anonymous  #Severe hypokalemia  and hypomagnesemia Replete electrolytes per protocol Pharmacy is following  #Right hip osteonecrosis status post right  hip surgery on September 30 Pain management as needed Postop care by orthopedics, okay to discharge patient from orthopedic standpoint with Xarelto for DVT prophylaxis DVT prophylaxis with Xarelto Physical therapy is recommending home health PT  #Hyponatremia from alcoholism Resolved sodium is at 135 today  Disposition Home health PT when medically stable  Discussed with orthopedics  All the records are reviewed and case discussed with Care Management/Social Workerr. Management plans discussed with the patient, she is  in agreement.  CODE STATUS: fc   TOTAL TIME TAKING CARE OF THIS PATIENT: 35  minutes.   POSSIBLE D/C IN 2-3  DAYS, DEPENDING ON CLINICAL CONDITION.  Note: This dictation was prepared with Dragon dictation along with smaller phrase technology. Any transcriptional errors that result from this process are unintentional.   Nicholes Mango M.D on 02/19/2019 at 1:51 PM  Between 7am to 6pm - Pager - [phone] After 6pm go to [email or web address] - password [email or web address] [facility]  Office  [phone]  CC: Primary care physician; Elgie Collard, MD

## 2019-02-19 NOTE — Progress Notes (Signed)
Pts HR elevated, MD Gouru notified. MD Placing orders

## 2019-02-19 NOTE — Progress Notes (Addendum)
Pharmacy Electrolyte Monitoring Consult:  Pharmacy consulted to assist in monitoring and replacing electrolytes in this 58 y.o. female admitted on 02/15/2019. Patient is POD 3 for osteonecrosis of right hip. Patient with history anemia, arthritis, asthma, B12 deficiency, hypertension, and edema.   Labs:  Sodium (mmol/[name])  Date Value  02/19/2019 134 ([name])  01/19/2014 141   Potassium (mmol/[name])  Date Value  02/19/2019 3.1 ([name])  01/19/2014 3.6   Magnesium (mg/dL)  Date Value  02/19/2019 1.7  12/28/2013 1.8   Phosphorus (mg/dL)  Date Value  02/18/2019 3.4  12/28/2013 3.5   Calcium (mg/dL)  Date Value  02/19/2019 7.8 ([name])   Calcium, Total (mg/dL)  Date Value  01/19/2014 8.1 ([name])   Albumin (g/dL)  Date Value  02/18/2019 3.1 ([name])   Corrected calcium: 8.5  Assessment/Plan: Patient currently on LR at 12mL/hr and furosemide 20mg  PO Daily. Potassium 40mg  PO q4hr x 2 doses and magnesium 2g IV x 1.   Will obtain BMP/Magnesium with am labs.   Will replace to maintain electrolytes within normal limits. Will replace for goal magnesium ~ 2 in setting of alcohol withdrawal.    Pharmacy will continue to monitor and adjust per consult.   Brenda [name],Brenda [name] 02/19/2019 8:16 AM

## 2019-02-19 NOTE — Evaluation (Signed)
Physical Therapy Evaluation Patient Details Name: Brenda [name] MRN: [id number] DOB: 1960/06/24 Today's Date: 02/19/2019   History of Present Illness  [name] diagnosed with osteonecrosis right hip and is s/p elective R THA.  PMH includes COPD, asthma, HTN, and CHF. Patient was transferred to CCU for alcohol withdrawal symptoms of severe tachycardia and hallucinations. Is now stablized and back to floor needing new PT evaluation.    Clinical Impression  Patient was alert and oriented and eager to participate in physical therapy today. Re-Evaluation today due to change in medical status due to transfer to CCU for alcohol withdrawal symptoms of severe tachycardia and hallucinations that have since improved so that she is back on the hospital floor. Confirmed no changes in PLOF or expected discharge environment. Patient demo mod I for bed mobility, requiring extra time and using bed handrails to go supine to sit. She required CGA - SBA for sit <> stand transfers using RW. She was able to ambulate 200 feet with RW and CGA - SBA for safety and frequent checks of her heart rate. Heart rate was 114 bpm while resting in reclined position, increased to 124 bpm when sitting, and up to 137 bpm while ambulating but stayed steady between 133-137 bpm during ambulation. Patient appears to have experienced a decline in functional independence and strength compared to her level of function prior to hospitalization but is improving with physical therapy since she returned from the CCU. Patient would benefit from continued physical therapy to address impairments and functional limitations (see PT Problem List below) to work towards stated goals and return to PLOF or maximal functional independence.       Follow Up Recommendations Home health PT;Supervision for mobility/OOB    Equipment Recommendations  Other (comment)(Pt may benefit from tub transfer bench, confirm with patient)    Recommendations for Other  Services       Precautions / Restrictions Precautions Precautions: Anterior Hip Restrictions Weight Bearing Restrictions: Yes RLE Weight Bearing: Weight bearing as tolerated      Mobility  Bed Mobility Overal bed mobility: Modified Independent Bed Mobility: Supine to Sit     Supine to sit: Modified independent (Device/Increase time);HOB elevated     General bed mobility comments: Extra time and effort and bed rail required but no physical assistance needed during sup to sit. HR 114 bpm in reclined position. Up to 124 bpm upon sitting up.  Transfers Overall transfer level: Needs assistance Equipment used: Rolling walker (2 wheeled) Transfers: Sit to/from Stand Sit to Stand: Min guard;Supervision         General transfer comment: Patient with slight tremor. HR climbs to 133 bpm when standing.  Ambulation/Gait Ambulation/Gait assistance: Min guard;Supervision Gait Distance (Feet): 200 Feet Assistive device: Rolling walker (2 wheeled) Gait Pattern/deviations: Step-through pattern;Decreased step length - left;Trunk flexed;Decreased stance time - right Gait velocity: decreased   General Gait Details: Ambulated around nursing station this afternoon with CGA - SBA and RW. Noted for slightly reduced weight acceptance on R LE that worsens with fatigues and improves with cuing to take bigger step through left LE. Demo good AD handling. HR climbs to 137 bpm  Stairs            Wheelchair Mobility    Modified Rankin (Stroke Patients Only)       Balance Overall balance assessment: Needs assistance   Sitting balance-Leahy Scale: Good     Standing balance support: Bilateral upper extremity supported;During functional activity Standing balance-Leahy  Scale: Fair Standing balance comment: requires BUE support for stable standing.                             Pertinent Vitals/Pain Pain Assessment: Faces Faces Pain Scale: Hurts a little bit Pain Location: R  hip when moving Pain Descriptors / Indicators: Grimacing;Guarding Pain Intervention(s): Limited activity within patient's tolerance;Monitored during session;Repositioned    Home Living Family/patient expects to be discharged to:: Private residence Living Arrangements: Alone Available Help at Discharge: Family;Available 24 hours/day(sister will be staying with her) Type of Home: Apartment Home Access: Level entry     Home Layout: One level Home Equipment: Bedside commode;Walker - 2 wheels;Cane - quad Additional Comments: Pt's sister is staying with the patient 24/7 as long as needed    Prior Function Level of Independence: Independent with assistive device(s)         Comments: Mod Ind amb with a RW for R hip pain control, works PT as Scientist, water quality at [employer], Ind with ADLs, no fall history     Hand Dominance   Dominant Hand: Right    Extremity/Trunk Assessment   Upper Extremity Assessment Upper Extremity Assessment: Overall WFL for tasks assessed    Lower Extremity Assessment Lower Extremity Assessment: Generalized weakness RLE Deficits / Details: R ankle PF/DF strength WFL, sensation to light touch intact to BLEs RLE: Unable to fully assess due to pain RLE Sensation: WNL    Cervical / Trunk Assessment Cervical / Trunk Assessment: Normal  Communication   Communication: No difficulties  Cognition Arousal/Alertness: Awake/alert   Overall Cognitive Status: Within Functional Limits for tasks assessed                                 General Comments: Patient very eager to go home. Follows directions well and appears to have returned to baseline cognitive function.      General Comments      Exercises Other Exercises Other Exercises: Discussed increased time for allowing vitals to normalize directly after going supine to sit and sit to stand. Education on proper transfer techniques and AD use while ambulating. Discussed HEP.   Assessment/Plan     PT Assessment    PT Problem List Decreased strength;Decreased activity tolerance;Decreased balance;Decreased mobility;Decreased knowledge of use of DME;Decreased range of motion;Pain       PT Treatment Interventions DME instruction;Gait training;Stair training;Functional mobility training;Therapeutic activities;Therapeutic exercise;Balance training;Patient/family education    PT Goals (Current goals can be found in the Care Plan section)  Acute Rehab PT Goals Patient Stated Goal: To get my life back and to get back to work PT Goal Formulation: With patient Time For Goal Achievement: 03/05/19 Potential to Achieve Goals: Good    Frequency BID   Barriers to discharge        Co-evaluation               AM-PAC PT "6 Clicks" Mobility  Outcome Measure Help needed turning from your back to your side while in a flat bed without using bedrails?: A Little Help needed moving from lying on your back to sitting on the side of a flat bed without using bedrails?: A Little Help needed moving to and from a bed to a chair (including a wheelchair)?: A Little Help needed standing up from a chair using your arms (e.g., wheelchair or bedside chair)?: A Little Help needed to walk  in hospital room?: A Little Help needed climbing 3-5 steps with a railing? : A Lot 6 Click Score: 17    End of Session Equipment Utilized During Treatment: Gait belt Activity Tolerance: Patient limited by pain;Patient limited by fatigue;Patient tolerated treatment well Patient left: in chair;with call bell/phone within reach;with chair alarm set;with SCD's reapplied;with family/visitor present Nurse Communication: Mobility status;Other (comment)(Updated physician with heart rate during each activity) PT Visit Diagnosis: Unsteadiness on feet (R26.81);Muscle weakness (generalized) (M62.81);Other abnormalities of gait and mobility (R26.89)    Time: [id number] PT Time Calculation (min) (ACUTE ONLY): 35 min   Charges:    PT Evaluation $PT Re-evaluation: 1 Re-eval PT Treatments $Gait Training: 8-22 mins        Everlean Alstrom. Graylon Good, PT, DPT 02/19/19, 1:54 PM

## 2019-02-19 NOTE — Progress Notes (Signed)
  Subjective:  POD #4 s/p right THA.   Patient reports hip hip pain as moderate to severe.  Patient tachycardic.  Otherwise no acute issues.  Objective:   VITALS:   Vitals:   02/19/19 1315 02/19/19 1339 02/19/19 1358 02/19/19 1423  BP:      Pulse: (!) 124 (!) 137 (!) 116 (!) 108  Resp:      Temp:      TempSrc:      SpO2:      Weight:      Height:        PHYSICAL EXAM: Right lower extremity Neurovascular intact Sensation intact distally Intact pulses distally Dorsiflexion/Plantar flexion intact Incision: moderate drainage No cellulitis present Compartment soft  LABS  Results for orders placed or performed during the hospital encounter of 02/15/19 (from the past 24 hour(s))  Magnesium     Status: None   Collection Time: 02/19/19  5:41 AM  Result Value Ref Range   Magnesium 1.7 1.7 - 2.4 mg/dL  Basic metabolic panel     Status: Abnormal   Collection Time: 02/19/19  5:41 AM  Result Value Ref Range   Sodium 134 (L) 135 - 145 mmol/L   Potassium 3.1 (L) 3.5 - 5.1 mmol/L   Chloride 98 98 - 111 mmol/L   CO2 26 22 - 32 mmol/L   Glucose, Bld 98 70 - 99 mg/dL   BUN 10 6 - 20 mg/dL   Creatinine, Ser 0.52 0.44 - 1.00 mg/dL   Calcium 7.8 (L) 8.9 - 10.3 mg/dL   GFR calc non Af Amer >60 >60 mL/min   GFR calc Af Amer >60 >60 mL/min   Anion gap 10 5 - 15    No results found.  Assessment/Plan: 4 Days Post-Op   Active Problems:   Osteonecrosis of right hip [facility])  Discharge cancelled today due to tachycardia.  Continue PT as medically indicated.  Continue xarelto.  Will defer to Dr. Harlow Mares regarding possible dressing change tomorrow for sanguinous drainage under aquacel dressing.     Thornton Park , MD 02/19/2019, 3:06 PM

## 2019-02-20 LAB — BASIC METABOLIC PANEL
Anion gap: 8 (ref 5–15)
BUN: 9 mg/dL (ref 6–20)
CO2: 25 mmol/L (ref 22–32)
Calcium: 8.5 mg/dL — ABNORMAL LOW (ref 8.9–10.3)
Chloride: 103 mmol/L (ref 98–111)
Creatinine, Ser: 0.36 mg/dL — ABNORMAL LOW (ref 0.44–1.00)
GFR calc Af Amer: >60 mL/min (ref >60)
GFR calc non Af Amer: >60 mL/min (ref >60)
Glucose, Bld: 108 mg/dL — ABNORMAL HIGH (ref 70–99)
Potassium: 4 mmol/L (ref 3.5–5.1)
Sodium: 136 mmol/L (ref 135–145)

## 2019-02-20 LAB — MAGNESIUM: Magnesium: 1.6 mg/dL — ABNORMAL LOW (ref 1.7–2.4)

## 2019-02-20 MED ORDER — METOPROLOL SUCCINATE ER 100 MG PO TB24: 100.0000 mg | ORAL_TABLET | Freq: Every day | ORAL | 0 refills | Status: DC

## 2019-02-20 MED ORDER — MAGNESIUM SULFATE 2 GM/50ML IV SOLN
2.0000 g | Freq: Once | INTRAVENOUS | Status: AC
  Administered 2019-02-20: 2 g via INTRAVENOUS
  Filled 2019-02-20: qty 50

## 2019-02-20 NOTE — Progress Notes (Signed)
[facility] [place] NAME: Brenda [name]    MR#:  [id number]  DATE OF BIRTH:  Mar 17, 1961  SUBJECTIVE:  CHIEF COMPLAINT: Patients tachycardia significantly improved with Toprol-XL, feels fine and wants to go home .CIWA-0.  Okay to discharge patient from medical standpoint.  Patient sister is going to stay with the patient at least for a week to assist her Off Precedex drip  REVIEW OF SYSTEMS:  CONSTITUTIONAL: No fever, fatigue or weakness.  EYES: No blurred or double vision.  EARS, NOSE, AND THROAT: No tinnitus or ear pain.  RESPIRATORY: No cough, shortness of breath, wheezing or hemoptysis.  CARDIOVASCULAR: No chest pain, orthopnea, edema.  GASTROINTESTINAL: No nausea, vomiting, diarrhea or abdominal pain.  GENITOURINARY: No dysuria, hematuria.  ENDOCRINE: No polyuria, nocturia,  HEMATOLOGY: No anemia, easy bruising or bleeding SKIN: No rash or lesion. MUSCULOSKELETAL: Some right hip pain NEUROLOGIC: No tingling, numbness, weakness.  Jittery PSYCHIATRY: no  anxiety or depression.   DRUG ALLERGIES:   Allergies  Allergen Reactions  . Spiriva [Tiotropium Bromide Monohydrate] Other (See Comments)    CLOSES THROAT  . Shellfish Allergy Diarrhea and Nausea And Vomiting  . Penicillins     DOES NOT KNOW WHAT TYPE OF REACTION   Patient was 58 years old when she had this  . Codeine Nausea And Vomiting    VITALS:  Blood pressure 119/82, pulse 80, temperature 98.6 F (37 C), temperature source Oral, resp. rate 18, height 5\' 4"  (1.626 m), weight 81.6 kg, SpO2 98 %.  PHYSICAL EXAMINATION:  GENERAL:  58 y.o.-year-old patient lying in the bed with no acute distress.  EYES: Pupils equal, round, reactive to light and accommodation. No scleral icterus. Extraocular muscles intact.  HEENT: Head atraumatic, normocephalic. Oropharynx and nasopharynx clear.  NECK:  Supple, no jugular venous distention. No thyroid enlargement, no tenderness.  LUNGS:  Normal breath sounds bilaterally, no wheezing, rales,rhonchi or crepitation. No use of accessory muscles of respiration.  CARDIOVASCULAR: S1, S2 normal. No murmurs, rubs, or gallops.  ABDOMEN: Soft, nontender, nondistended. Bowel sounds present EXTREMITIES: Right hip with dressing no pedal edema, cyanosis, or clubbing.  NEUROLOGIC: Cranial nerves II through XII are intact. Muscle strength generalized weakness in all extremities. Sensation intact. Gait not checked for safety.  Positive tremors PSYCHIATRIC: The patient is alert and oriented x 3.  SKIN: No obvious rash, lesion, or ulcer.    LABORATORY PANEL:   CBC Recent Labs  Lab 02/17/19 1956  WBC 5.2  HGB 10.2*  HCT 30.0*  PLT 103*   ------------------------------------------------------------------------------------------------------------------  Chemistries  Recent Labs  Lab 02/18/19 0435  02/20/19 0616  NA 135   < > 136  K 3.4*   < > 4.0  CL 97*   < > 103  CO2 26   < > 25  GLUCOSE 86   < > 108*  BUN 8   < > 9  CREATININE 0.46   < > 0.36*  CALCIUM 7.6*   < > 8.5*  MG 1.8   < > 1.6*  AST 38  --   --   ALT 32  --   --   ALKPHOS 48  --   --   BILITOT 1.4*  --   --    < > = values in this interval not displayed.   ------------------------------------------------------------------------------------------------------------------  Cardiac Enzymes No results for input(s): TROPONINI in the last 168 hours. ------------------------------------------------------------------------------------------------------------------  RADIOLOGY:  No results found.  EKG:   Orders  placed or performed in visit on 01/20/19  . EKG 12-Lead  . EKG 12-Lead  . EKG 12-Lead    ASSESSMENT AND PLAN:  #Sinus tachycardia Hydrated with IV fluids, tachycardia resolved Patient is on metoprolol XL 100 mg once daily   #Alcohol withdrawal with severe sinus tachycardia and tremors CIWA protocol implemented today CIWA score is 0 She is off  Precedex drip Clinically improving Outpatient alcohol Anonymous  #Severe hypokalemia and hypomagnesemia Repleted electrolytes per protocol Pharmacy is following  #Right hip osteonecrosis status post right hip surgery on September 30 Pain management as needed Postop care by orthopedics, okay to discharge patient from orthopedic standpoint with Xarelto for DVT prophylaxis DVT prophylaxis with Xarelto Physical therapy is recommending home health PT  #Hyponatremia from alcoholism Resolved sodium is at 135 today  Disposition Home health PT. medically stable to be discharged   All the records are reviewed and case discussed with Care Management/Social Workerr. Management plans discussed with the patient, she is  in agreement.  CODE STATUS: fc   TOTAL TIME TAKING CARE OF THIS PATIENT: 35  minutes.   POSSIBLE D/C IN 2-3  DAYS, DEPENDING ON CLINICAL CONDITION.  Note: This dictation was prepared with Dragon dictation along with smaller phrase technology. Any transcriptional errors that result from this process are unintentional.   Nicholes Mango M.D on 02/20/2019 at 9:35 AM  Between 7am to 6pm - Pager - [phone] After 6pm go to [email or web address] - password [email or web address] [facility]  Office  [phone]  CC: Primary care physician; Elgie Collard, MD

## 2019-02-20 NOTE — Progress Notes (Signed)
  Subjective:  Patient reports pain as mild to moderate.  No confusion today.  Patient in good spirits.  No tachycardia today.  Objective:   VITALS:   Vitals:   02/19/19 1614 02/19/19 1648 02/19/19 2341 02/20/19 0739  BP:  (!) 145/104 123/90 119/82  Pulse: 97 91 74 80  Resp:  17 18 18   Temp:  98.7 F (37.1 C) 98.1 F (36.7 C) 98.6 F (37 C)  TempSrc:   Oral Oral  SpO2:  99% 97% 98%  Weight:      Height:        PHYSICAL EXAM:  Neurologically intact ABD soft Neurovascular intact Sensation intact distally Intact pulses distally Dorsiflexion/Plantar flexion intact Incision: moderate drainage and dressing changed No cellulitis present Compartment soft  LABS  Results for orders placed or performed during the hospital encounter of 02/15/19 (from the past 24 hour(s))  Magnesium     Status: Abnormal   Collection Time: 02/20/19  6:16 AM  Result Value Ref Range   Magnesium 1.6 (L) 1.7 - 2.4 mg/dL  Basic metabolic panel     Status: Abnormal   Collection Time: 02/20/19  6:16 AM  Result Value Ref Range   Sodium 136 135 - 145 mmol/L   Potassium 4.0 3.5 - 5.1 mmol/L   Chloride 103 98 - 111 mmol/L   CO2 25 22 - 32 mmol/L   Glucose, Bld 108 (H) 70 - 99 mg/dL   BUN 9 6 - 20 mg/dL   Creatinine, Ser 0.36 (L) 0.44 - 1.00 mg/dL   Calcium 8.5 (L) 8.9 - 10.3 mg/dL   GFR calc non Af Amer >60 >60 mL/min   GFR calc Af Amer >60 >60 mL/min   Anion gap 8 5 - 15    No results found.  Assessment/Plan: 5 Days Post-Op   Active Problems:   Osteonecrosis of right hip (HCC)   Advance diet Up with therapy  Continue xarelto.  Continue pain control. Dressing changed. Pharmacy consulted to assist in monitoring and replacing electrolytes  Pharmacy will replace to maintain electrolytes within normal limits. Will replace for goal magnesium ~ 2 in setting of alcohol withdrawal. Discharge today or tomorrow depending on meeting PT goals, recurrent tachycardia and  electrolytes     Carlynn Spry , PA-C 02/20/2019, 8:06 AM

## 2019-02-20 NOTE — Discharge Summary (Signed)
Physician Discharge Summary  Patient ID: Brenda [name] MRN: [id number] DOB/AGE: 1960/08/06 58 y.o.  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 Anemia   . Arthritis   . Asthma   . Atypical pneumonia    a. 12/2013  . B12 deficiency 10/07/2014  . Clostridium difficile colitis    a. 12/2013.was sick with pneumonia and sepsis  . Hypertension   . Lower extremity edema     Surgeries: Procedure(s): TOTAL HIP ARTHROPLASTY ANTERIOR APPROACH on 02/15/2019   Consultants (if any):   Discharged Condition: Improved  Hospital Course: Brenda [name] is an 58 y.o. 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    Hospital stay was complicated by confusion and tachycardia from alcohol withdrawal.  Hospitalist consulted and she was placed on Toprol XL.  She was given perioperative antibiotics:  Anti-infectives (From admission, onward)   Start     Dose/Rate Route Frequency Ordered Stop   02/15/19 1700  clindamycin (CLEOCIN) IVPB 600 mg     600 mg 100 mL/hr over 30 Minutes Intravenous Every 6 hours 02/15/19 1536 02/15/19 2245   02/15/19 1220  50,000 units bacitracin in 0.9% normal saline 250 mL irrigation  Status:  Discontinued       As needed 02/15/19 1220 02/15/19 1251   02/15/19 0845  clindamycin (CLEOCIN) 900 MG/50ML IVPB    Note to Pharmacy: [name], Brenda   : cabinet override      02/15/19 0845 02/15/19 1055   02/15/19 0600  clindamycin (CLEOCIN) IVPB 900 mg     900 mg 100 mL/hr over 30 Minutes Intravenous On call to O.R. 02/14/19 2205 02/15/19 1055     .  She was given sequential compression devices, early ambulation, and Xarelto for DVT prophylaxis.  She benefited maximally from the hospital stay and there were no complications.    Recent vital signs:  Vitals:   02/19/19 2341 02/20/19 0739  BP: 123/90 119/82  Pulse: 74 80  Resp: 18 18  Temp: 98.1 F (36.7 C) 98.6 F (37 C)  SpO2: 97% 98%    Recent laboratory studies:  Lab Results  Component Value Date   HGB 10.2 (L) 02/17/2019   HGB 10.8 (L) 02/17/2019   HGB 10.8 (L) 02/17/2019   Lab Results  Component Value Date   WBC 5.2 02/17/2019   PLT 103 (L) 02/17/2019   Lab Results  Component Value Date   INR 1.0 02/07/2019   Lab Results  Component Value Date   NA 136 02/20/2019   K 4.0 02/20/2019   CL 103 02/20/2019   CO2 25 02/20/2019   BUN 9 02/20/2019   CREATININE 0.36 (L) 02/20/2019   GLUCOSE 108 (H) 02/20/2019    Discharge Medications:   Allergies as of 02/20/2019      Reactions   Spiriva [tiotropium Bromide Monohydrate] Other (See Comments)   CLOSES THROAT   Shellfish Allergy Diarrhea, Nausea And Vomiting   Penicillins    DOES NOT KNOW WHAT TYPE OF REACTION   Patient was 58 years old when she had this   Codeine Nausea And Vomiting      Medication  List    STOP taking these medications   aspirin 81 MG tablet   ibuprofen 100 MG/5ML suspension Commonly known as: ADVIL     TAKE these medications   acetaminophen 325 MG tablet Commonly known as: TYLENOL Take 650 mg by mouth every 6 (six) hours as needed for moderate pain. Notes to patient: Last dose was Thursday at 11:51 am   albuterol 108 (90 Base) MCG/ACT inhaler Commonly known as: VENTOLIN HFA Inhale 2 puffs into the lungs every 6 (six) hours as needed for wheezing or shortness of breath.   Biotin 10000 MCG Tabs Take 10,000 mcg by mouth daily. Notes to patient: Not given during this hospitalization   calcium carbonate 1500 (600 Ca) MG Tabs tablet Commonly known as: OSCAL Take 600 mg of  elemental calcium by mouth daily with breakfast. Notes to patient: Not given during this hospitalization   cetirizine 10 MG tablet Commonly known as: ZYRTEC Take 10 mg by mouth daily. Notes to patient: Not given during this hospitalization   Coenzyme Q10 100 MG Tabs Take 100 mg by mouth daily. Notes to patient: Not given during this hospitalization   cyanocobalamin 1000 MCG/ML injection Commonly known as: (VITAMIN B-12) Inject 1,000 mcg into the muscle See admin instructions. Inject 1000 mcg intramuscularly every 60 days Notes to patient: Not given during this hospitalization   diclofenac sodium 1 % Gel Commonly known as: VOLTAREN Apply 2 g topically 3 (three) times daily as needed (pain). Notes to patient: Not given during this hospitalization   docusate sodium 100 MG capsule Commonly known as: COLACE Take 1 capsule (100 mg total) by mouth 2 (two) times daily.   Fish Oil 1000 MG Caps Take 1,000 mg by mouth daily. Notes to patient: Not given during this hospitalization   furosemide 20 MG tablet Commonly known as: LASIX Take 1 tablet (20 mg total) by mouth daily.   HYDROcodone-acetaminophen 5-325 MG tablet Commonly known as: NORCO/VICODIN Take 1-2 tablets by mouth every 4 (four) hours as needed for moderate pain (pain score 4-6).   methocarbamol 500 MG tablet Commonly known as: ROBAXIN Take 1 tablet (500 mg total) by mouth every 6 (six) hours as needed for muscle spasms. Notes to patient: Not given during this hospitalization   metoprolol succinate 100 MG 24 hr tablet Commonly known as: TOPROL-XL Take 1 tablet (100 mg total) by mouth daily. Take with or immediately following a meal. Start taking on: February 21, 2019   metoprolol tartrate 100 MG tablet Commonly known as: LOPRESSOR Take 100 mg by mouth every evening.   MULTIVITAMIN GUMMIES ADULT PO Take 1 tablet by mouth daily. Notes to patient: Not given during this hospitalization   Potassium Gluconate 550 (90 K)  MG Tabs Take 550 mg by mouth daily. Notes to patient: Not given during this hospitalization   PROBIOTIC & ACIDOPHILUS EX ST PO Take 1 capsule by mouth daily. Notes to patient: Not given during this hospitalization   rivaroxaban 10 MG Tabs tablet Commonly known as: XARELTO Take 1 tablet (10 mg total) by mouth daily with breakfast. May continue aspirin once xarelto discontinued   traMADol 50 MG tablet Commonly known as: ULTRAM Take 1 tablet (50 mg total) by mouth every 6 (six) hours as needed. Notes to patient: Not given during this hospitalization   Vitamin D (Ergocalciferol) 1.25 MG (50000 UT) Caps capsule Commonly known as: DRISDOL Take 50,000 Units by mouth See admin instructions. Take 50000 units by mouth every 60 days with b12 injection Notes to  patient: Not given during this hospitalization            Durable Medical Equipment  (From admission, onward)         Start     Ordered   02/17/19 0655  For home use only DME 3 n 1  Once     02/17/19 0654   02/17/19 0655  For home use only DME Walker rolling  Once    Question:  Patient needs a walker to treat with the following condition  Answer:  Osteoarthritis of right hip   02/17/19 0654   02/15/19 1537  DME Walker rolling  Once    Question:  Patient needs a walker to treat with the following condition  Answer:  Status post total hip replacement, right   02/15/19 1536   02/15/19 1537  DME 3 n 1  Once     02/15/19 1536   02/15/19 1537  DME Bedside commode  Once    Question:  Patient needs a bedside commode to treat with the following condition  Answer:  Status post THR (total hip replacement)   02/15/19 1536          Diagnostic Studies: X-ray Chest Pa Or Ap  Result Date: 02/15/2019 CLINICAL DATA:  Pre-procedure.  Shortness of breath EXAM: CHEST  1 VIEW COMPARISON:  01/20/2019 FINDINGS: Minimal bibasilar atelectasis. Heart is normal size. No effusions. No acute bony abnormality. IMPRESSION: Minimal bibasilar  atelectasis. Electronically Signed   By: Rolm Baptise M.D.   On: 02/15/2019 09:36   Dg Hip Operative Unilat W Or W/o Pelvis Right  Result Date: 02/15/2019 CLINICAL DATA:  Hip replacement EXAM: OPERATIVE RIGHT HIP (WITH PELVIS IF PERFORMED) 2 VIEWS TECHNIQUE: Fluoroscopic spot image(s) were submitted for interpretation post-operatively. COMPARISON:  December 18, 2018 FINDINGS: The patient has undergone total hip arthroplasty on the right. The alignment appears near anatomic. There are expected postsurgical changes including subcutaneous gas and overlying soft tissue edema. There is no evidence for hardware fracture or failure. IMPRESSION: Status post right total hip arthroplasty without evidence for immediate hardware complication. Electronically Signed   By: Constance Holster M.D.   On: 02/15/2019 12:41    Disposition: Discharge disposition: 01-Home or Self Care            Signed: Carlynn Spry ,PA-C 02/20/2019, 10:56 AM

## 2019-02-20 NOTE — Progress Notes (Signed)
Pharmacy Electrolyte Monitoring Consult:  Pharmacy consulted to assist in monitoring and replacing electrolytes in this 58 y.o. female admitted on 02/15/2019. Patient is POD 3 for osteonecrosis of right hip. Patient with history anemia, arthritis, asthma, B12 deficiency, hypertension, and edema.   Labs:  Sodium (mmol/L)  Date Value  02/20/2019 136  01/19/2014 141   Potassium (mmol/L)  Date Value  02/20/2019 4.0  01/19/2014 3.6   Magnesium (mg/dL)  Date Value  02/20/2019 1.6 (L)  12/28/2013 1.8   Phosphorus (mg/dL)  Date Value  02/18/2019 3.4  12/28/2013 3.5   Calcium (mg/dL)  Date Value  02/20/2019 8.5 (L)   Calcium, Total (mg/dL)  Date Value  01/19/2014 8.1 (L)   Albumin (g/dL)  Date Value  02/18/2019 3.1 (L)   Corrected calcium: 9.22  Assessment/Plan: Patient currently on LR at 27mL/hr and NS + KCL 20 mEq @75  mL/hr. Hypomagnesemic today.    Will replace to maintain electrolytes within normal limits. Will replace for goal magnesium ~ 2 in setting of alcohol withdrawal.  1. Will order Magnesium 2 g x1 dose.   Will obtain BMP/Magnesium/Phosphorus with AM labs.   Pharmacy will continue to monitor and adjust per consult.   Rowland Lathe 02/20/2019 7:27 AM

## 2019-02-20 NOTE — Progress Notes (Signed)
Physical Therapy Treatment Patient Details Name: Brenda [name] MRN: [id number] DOB: [birth year] Today's Date: 02/20/2019    History of Present Illness [name] diagnosed with osteonecrosis right hip and is s/p elective R THA.  PMH includes COPD, asthma, HTN, and CHF. Patient was transferred to CCU for alcohol withdrawal symptoms of severe tachycardia and hallucinations. Is now stablized and back to floor.    PT Comments    Pt presented with mild deficits in strength, transfers, mobility, gait, balance, and activity tolerance. Pt's HR and SpO2 was monitored throughout, Rest HR at rest was 95, and with functional activity and stair training HR elevated appropriately, to a max of 99. SpO2 stayed within a range of 92-98% throughout. Pt completed mobility with mod Ind. Pt sat up and completed sit<>stand with CGA. Pt walked to and from the gym, and completed stair training to prepare for safe community ambulation with an "up with the good, down with the bad" pattern using bilat railings and min A for stability. Pt reported that stairs felt like they were "medium-hard", but did not fatigue in-session. Pt will benefit from HHPT services upon discharge to safely address above deficits for decreased caregiver assistance and eventual return to PLOF.     Follow Up Recommendations  Home health PT;Supervision for mobility/OOB     Equipment Recommendations       Recommendations for Other Services       Precautions / Restrictions Precautions Precautions: Anterior Hip Restrictions Weight Bearing Restrictions: Yes RLE Weight Bearing: Weight bearing as tolerated    Mobility  Bed Mobility Overal bed mobility: Modified Independent             General bed mobility comments: Extra time and effort and bed rail required but no physical assistance needed during sup to sit.  Transfers Overall transfer level: Needs assistance Equipment used: Rolling walker (2 wheeled) Transfers: Sit to/from  Stand Sit to Stand: Min guard         General transfer comment: Patient with slight tremor in standing.  Ambulation/Gait Ambulation/Gait assistance: Min guard Gait Distance (Feet): 200 Feet Assistive device: Rolling walker (2 wheeled) Gait Pattern/deviations: Step-through pattern;Decreased step length - left;Trunk flexed;Decreased stance time - right Gait velocity: Decreased   General Gait Details: Ambulated and took about a 100' walk x2   Stairs Stairs: Yes Stairs assistance: Min assist Stair Management: Two rails;Forwards Number of Stairs: 4 General stair comments: Pt educated on the "up with the good, down with the bad" pattern for safe community ambulation. Good concentric control, fair eccentric control during stair training.   Wheelchair Mobility    Modified Rankin (Stroke Patients Only)       Balance Overall balance assessment: Needs assistance Sitting-balance support: Single extremity supported Sitting balance-Leahy Scale: Good     Standing balance support: Bilateral upper extremity supported;During functional activity Standing balance-Leahy Scale: Good Standing balance comment: Uses BUE support in RW for stable standing.                            Cognition Arousal/Alertness: Awake/alert Behavior During Therapy: WFL for tasks assessed/performed Overall Cognitive Status: Within Functional Limits for tasks assessed                                 General Comments: Patient very eager to go home. Follows directions well and appears to have returned to  baseline cognitive function.      Exercises Total Joint Exercises Ankle Circles/Pumps: Strengthening;Both;10 reps;15 reps Quad Sets: Strengthening;Both;10 reps;15 reps Gluteal Sets: Strengthening;Both;10 reps;15 reps Hip ABduction/ADduction: AROM;Both;10 reps;15 reps Straight Leg Raises: AROM;Both;10 reps Knee Flexion: Strengthening;Both;10 reps;15 reps Marching in Standing:  AROM;Both;5 reps Other Exercises Other Exercises: Pt re-educated on HEP Other Exercises: Pt educated on safe stair pattern. Asked to repeat/teach-back the pattern.    General Comments        Pertinent Vitals/Pain Pain Assessment: 0-10 Pain Score: 2  Pain Location: R hip when moving Pain Descriptors / Indicators: Tightness Pain Intervention(s): Limited activity within patient's tolerance;Monitored during session    Home Living                      Prior Function            PT Goals (current goals can now be found in the care plan section) Progress towards PT goals: Progressing toward goals    Frequency    BID      PT Plan Current plan remains appropriate    Co-evaluation              AM-PAC PT "6 Clicks" Mobility   Outcome Measure  Help needed turning from your back to your side while in a flat bed without using bedrails?: None Help needed moving from lying on your back to sitting on the side of a flat bed without using bedrails?: None Help needed moving to and from a bed to a chair (including a wheelchair)?: A Little Help needed standing up from a chair using your arms (e.g., wheelchair or bedside chair)?: A Little Help needed to walk in hospital room?: A Little Help needed climbing 3-5 steps with a railing? : A Lot 6 Click Score: 19    End of Session Equipment Utilized During Treatment: Gait belt Activity Tolerance: Patient tolerated treatment well Patient left: in bed;with call bell/phone within reach;with bed alarm set;with SCD's reapplied Nurse Communication: Mobility status;Other (comment)(Updated nursing with heart rate during activities) PT Visit Diagnosis: Unsteadiness on feet (R26.81);Muscle weakness (generalized) (M62.81);Other abnormalities of gait and mobility (R26.89)     Time: [id number] PT Time Calculation (min) (ACUTE ONLY): 40 min  Charges:                        Juanda Crumble "Gus" Pharell Rolfson, SPT  02/20/19, 2:12 PM

## 2019-02-20 NOTE — TOC Transition Note (Signed)
Transition of Care [facility]) - CM/SW Discharge Note   Patient Details  Name: Brenda [name] MRN: [id number] Date of Birth: June 20, 1960  [facility] CM/SW Contact:  Su Hilt, RN Phone Number: 02/20/2019, 11:43 AM   Clinical Narrative:    Met with the patient today to confirm DC plan, her sister is here form out of state to help her and stay with her.  I provided her with AA information and other [place] resources, provided her with the Purple book as well, she does not have any DME needs , she is set up with [facility] and [place] is aware of the DC NO additional needs   Final next level of care: [place] Barriers to Discharge: Continued Medical Work up   Patient Goals and CMS Choice Patient states their goals for this hospitalization and ongoing recovery are:: go home      Discharge Placement                       Discharge Plan and Services   Discharge Planning Services: CM Consult Post Acute Care Choice: Home Health          DME Arranged: N/A         HH Arranged: PT HH Agency: [facility] Date [place]: 02/16/19 Time [place]: 1011 Representative spoke with at [place]: [place] ([place]) Interventions     Readmission Risk Interventions No flowsheet data found.

## 2019-02-23 NOTE — H&P (Signed)
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 Symptoms are rated as moderate to severe, and have been worsening.  This is significantly impairing activities of daily living.  She has elected for surgical management.   Past Medical History:  Diagnosis Date  . Anemia   . Arthritis   . Asthma   . Atypical pneumonia    a. 12/2013  . B12 deficiency 10/07/2014  . Clostridium difficile colitis    a. 12/2013.was sick with pneumonia and sepsis  . Hypertension   . Lower extremity edema    Past Surgical History:  Procedure Laterality Date  . COLONOSCOPY WITH PROPOFOL N/A 08/30/2017   Procedure: COLONOSCOPY WITH PROPOFOL;  Surgeon: Lin Landsman, MD;  Location: [facility] ENDOSCOPY;  Service: Gastroenterology;  Laterality: N/A;  . COLONOSCOPY WITH PROPOFOL N/A 08/31/2017   Procedure: COLONOSCOPY WITH PROPOFOL;  Surgeon: Lin Landsman, MD;  Location: [facility] ENDOSCOPY;  Service: Gastroenterology;  Laterality: N/A;  . KNEE SURGERY Left 2014   arthroscopy. had to replace tendon with cadaver tissue  . NECK SURGERY  2014   metal plate in neck  . SHOULDER SURGERY Left 2012   arthroscopy  . THROAT SURGERY  2013   removed nodules.  benign  . TONSILLECTOMY    . TOTAL HIP ARTHROPLASTY Right 02/15/2019   Procedure: TOTAL HIP ARTHROPLASTY ANTERIOR APPROACH;  Surgeon: Lovell Sheehan, MD;  Location: [facility] ORS;  Service: Orthopedics;  Laterality: Right;   Social History   Socioeconomic History  . Marital status: Divorced    Spouse name: Not on file  . Number of children: Not on file  . Years of education: Not on file  . Highest education level: Not on file  Occupational History  . Not on file  Social Needs  . Financial resource strain: Not on file  . Food insecurity    Worry: Not on file    Inability: Not on file   . Transportation needs    Medical: Not on file    Non-medical: Not on file  Tobacco Use  . Smoking status: Current Some Day Smoker    Packs/day: 0.50    Years: 8.00    Pack years: 4.00    Types: Cigarettes  . Smokeless tobacco: Never Used  . Tobacco comment: smokes about 2 cigs per day  Substance and Sexual Activity  . Alcohol use: Yes    Comment: SOCIAL DRINKER 1-2 vodka a week  . Drug use: No  . Sexual activity: Not on file  Lifestyle  . Physical activity    Days per week: Not on file    Minutes per session: Not on file  . Stress: Not on file  Relationships  . Social Herbalist on phone: Not on file    Gets together: Not on file    Attends religious service: Not on file    Active member of club or organization: Not on file    Attends meetings of clubs or organizations: Not on file    Relationship status: Not on file  Other Topics Concern  . Not on file  Social History Narrative   Lives alone.  Sister, Juliann Pulse from [place] will be coming to stay with patient for 2 weeks.   Family History  Problem Relation Age of Onset  . Multiple myeloma Mother   . Breast  cancer Maternal Aunt   . Lung cancer Maternal Grandmother   . Parkinson's disease Father    Allergies  Allergen Reactions  . Spiriva [Tiotropium Bromide Monohydrate] Other (See Comments)    CLOSES THROAT  . Shellfish Allergy Diarrhea and Nausea And Vomiting  . Penicillins     DOES NOT KNOW WHAT TYPE OF REACTION   Patient was 58 years old when she had this  . Codeine Nausea And Vomiting   Prior to Admission medications   Medication Sig Start Date End Date Taking? Authorizing Provider  acetaminophen (TYLENOL) 325 MG tablet Take 650 mg by mouth every 6 (six) hours as needed for moderate pain.   Yes [provider]  albuterol (PROVENTIL HFA;VENTOLIN HFA) 108 (90 BASE) MCG/ACT inhaler Inhale 2 puffs into the lungs every 6 (six) hours as needed for wheezing or shortness of breath. 01/30/14  Yes  Juanito Doom, MD  Biotin 10000 MCG TABS Take 10,000 mcg by mouth daily.   Yes [provider]  calcium carbonate (OSCAL) 1500 (600 Ca) MG TABS tablet Take 600 mg of elemental calcium by mouth daily with breakfast.   Yes [provider]  cetirizine (ZYRTEC) 10 MG tablet Take 10 mg by mouth daily.   Yes [provider]  Coenzyme Q10 100 MG TABS Take 100 mg by mouth daily.    Yes [provider]  cyanocobalamin (,VITAMIN B-12,) 1000 MCG/ML injection Inject 1,000 mcg into the muscle See admin instructions. Inject 1000 mcg intramuscularly every 60 days   Yes [provider]  diclofenac sodium (VOLTAREN) 1 % GEL Apply 2 g topically 3 (three) times daily as needed (pain).  01/21/17  Yes [provider]  furosemide (LASIX) 20 MG tablet Take 1 tablet (20 mg total) by mouth daily. 01/11/14  Yes Theora Gianotti, NP  metoprolol tartrate (LOPRESSOR) 100 MG tablet Take 100 mg by mouth every evening.    Yes [provider]  Multiple Vitamins-Minerals (MULTIVITAMIN GUMMIES ADULT PO) Take 1 tablet by mouth daily.    Yes [provider]  Omega-3 Fatty Acids (FISH OIL) 1000 MG CAPS Take 1,000 mg by mouth daily.    Yes [provider]  Potassium Gluconate 550 (90 K) MG TABS Take 550 mg by mouth daily.   Yes [provider]  Probiotic Product (PROBIOTIC & ACIDOPHILUS EX ST PO) Take 1 capsule by mouth daily.    Yes [provider]  Vitamin D, Ergocalciferol, (DRISDOL) 1.25 MG (50000 UT) CAPS capsule Take 50,000 Units by mouth See admin instructions. Take 50000 units by mouth every 60 days with b12 injection   Yes [provider]  docusate sodium (COLACE) 100 MG capsule Take 1 capsule (100 mg total) by mouth 2 (two) times daily. 02/17/19   Carlynn Spry, PA-C  HYDROcodone-acetaminophen (NORCO/VICODIN) 5-325 MG tablet Take 1-2 tablets by mouth every 4 (four) hours as needed for moderate pain (pain score  4-6). 02/17/19   Carlynn Spry, PA-C  methocarbamol (ROBAXIN) 500 MG tablet Take 1 tablet (500 mg total) by mouth every 6 (six) hours as needed for muscle spasms. 02/17/19   Carlynn Spry, PA-C  metoprolol succinate (TOPROL-XL) 100 MG 24 hr tablet Take 1 tablet (100 mg total) by mouth daily. Take with or immediately following a meal. 02/21/19   Carlynn Spry, PA-C  rivaroxaban (XARELTO) 10 MG TABS tablet Take 1 tablet (10 mg total) by mouth daily with breakfast. May continue aspirin once xarelto discontinued 02/17/19   Carlynn Spry, PA-C  traMADol (ULTRAM) 50 MG tablet Take 1 tablet (50 mg total) by mouth every 6 (six) hours as needed. Patient not taking: Reported on 02/01/2019 12/18/18   Versie Starks, PA-C     Positive ROS: All other systems have been reviewed and were otherwise negative with the exception of those mentioned in the HPI and as above.  Physical Exam: General: Alert, no acute distress Cardiovascular: Regular rate and rhythm, no murmurs rubs or gallops.  No pedal edema Respiratory: Clear to auscultation bilaterally, no wheezes rales or rhonchi. No cyanosis, no use of accessory musculature GI: No organomegaly, abdomen is soft and non-tender nondistended with positive bowel sounds. Skin: Skin intact, no lesions within the operative field. 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 The risks include but are not limited to infection, bleeding requiring blood transfusion, nerve or blood vessel injury, joint stiffness or loss of motion, persistent pain, weakness or instability, malunion, nonunion and hardware failure and the need for further  surgery. Medical risks include but are not limited to DVT and pulmonary embolism, myocardial infarction, stroke, pneumonia, respiratory failure and death. Patient understood these risks and wished to proceed.   Lovell Sheehan, MD   02/23/2019 1:07 PM

## 2019-03-23 LAB — BLOOD GAS, ARTERIAL
Acid-Base Excess: 2.3 mmol/L — ABNORMAL HIGH (ref 0.0–2.0)
Bicarbonate: 24.7 mmol/L (ref 20.0–28.0)
O2 Saturation: 98.3 %
Patient temperature: 37
pCO2 arterial: 31 mmHg — ABNORMAL LOW (ref 32.0–48.0)
pH, Arterial: 7.51 — ABNORMAL HIGH (ref 7.350–7.450)
pO2, Arterial: 100 mmHg (ref 83.0–108.0)

## 2019-08-03 ENCOUNTER — Ambulatory Visit: Payer: BLUE CROSS/BLUE SHIELD

## 2019-08-05 ENCOUNTER — Ambulatory Visit: Payer: Self-pay | Attending: Internal Medicine

## 2019-08-05 DIAGNOSIS — Z23 Encounter for immunization: Secondary | ICD-10-CM

## 2019-08-05 NOTE — Progress Notes (Signed)
   Covid-19 Vaccination Clinic  Name:  HARMONIE WONDRA    MRN: [id number] DOB: 09-19-1960  08/05/2019  Ms. Kassam was observed post Covid-19 immunization for 15 minutes without incident. She was provided with Vaccine Information Sheet and instruction to access the V-Safe system.   Ms. Trench was instructed to call 911 with any severe reactions post vaccine: Marland Kitchen Difficulty breathing  . Swelling of face and throat  . A fast heartbeat  . A bad rash all over body  . Dizziness and weakness   Immunizations Administered    Name Date Dose VIS Date Route   Pfizer COVID-19 Vaccine 08/05/2019  1:43 PM 0.3 mL 04/28/2019 Intramuscular   Manufacturer: [place]   Lot: [id number]   [place]: [id number]

## 2019-08-12 ENCOUNTER — Ambulatory Visit: Payer: BLUE CROSS/BLUE SHIELD

## 2019-08-29 ENCOUNTER — Ambulatory Visit: Payer: Self-pay | Attending: Internal Medicine

## 2019-08-29 DIAGNOSIS — Z23 Encounter for immunization: Secondary | ICD-10-CM

## 2019-08-29 NOTE — Progress Notes (Signed)
   Covid-19 Vaccination Clinic  Name:  XOEY PEGLOW    MRN: [id number] DOB: 07-18-1960  08/29/2019  Ms. Buick was observed post Covid-19 immunization for 15 minutes without incident. She was provided with Vaccine Information Sheet and instruction to access the V-Safe system.   Ms. Crisan was instructed to call 911 with any severe reactions post vaccine: Marland Kitchen Difficulty breathing  . Swelling of face and throat  . A fast heartbeat  . A bad rash all over body  . Dizziness and weakness   Immunizations Administered    Name Date Dose VIS Date Route   Pfizer COVID-19 Vaccine 08/29/2019  3:05 PM 0.3 mL 04/28/2019 Intramuscular   Manufacturer: [place]   Lot: [id number]   [place]: [id number]

## 2020-05-06 ENCOUNTER — Ambulatory Visit
Admission: RE | Admit: 2020-05-06 | Discharge: 2020-05-06 | Disposition: A | Discharge status: Home / Self Care | Payer: 59 | Source: Ambulatory Visit | Attending: Obstetrics and Gynecology | Admitting: Obstetrics and Gynecology

## 2020-05-06 ENCOUNTER — Other Ambulatory Visit: Payer: Self-pay

## 2020-05-06 ENCOUNTER — Other Ambulatory Visit: Payer: Self-pay | Admitting: Obstetrics and Gynecology

## 2020-05-06 DIAGNOSIS — Z1231 Encounter for screening mammogram for malignant neoplasm of breast: Secondary | ICD-10-CM | POA: Diagnosis not present

## 2020-05-07 ENCOUNTER — Inpatient Hospital Stay
Admission: RE | Admit: 2020-05-07 | Discharge: 2020-05-07 | Disposition: A | Discharge status: Home / Self Care | Payer: Self-pay | Source: Ambulatory Visit | Attending: *Deleted | Admitting: *Deleted

## 2020-05-07 ENCOUNTER — Other Ambulatory Visit: Payer: Self-pay | Admitting: *Deleted

## 2020-05-07 DIAGNOSIS — Z1231 Encounter for screening mammogram for malignant neoplasm of breast: Secondary | ICD-10-CM

## 2020-07-19 ENCOUNTER — Telehealth: Payer: 59

## 2020-07-24 ENCOUNTER — Other Ambulatory Visit: Payer: Self-pay

## 2020-07-24 ENCOUNTER — Telehealth (INDEPENDENT_AMBULATORY_CARE_PROVIDER_SITE_OTHER): Payer: Self-pay | Admitting: Gastroenterology

## 2020-07-24 DIAGNOSIS — D649 Anemia, unspecified: Secondary | ICD-10-CM | POA: Insufficient documentation

## 2020-07-24 DIAGNOSIS — Z86010 Personal history of colonic polyps: Secondary | ICD-10-CM

## 2020-07-24 MED ORDER — PEG 3350-KCL-NA BICARB-NACL 420 G PO SOLR: 4000.0000 mL | Freq: Once | ORAL | 0 refills | Status: AC

## 2020-07-24 NOTE — Progress Notes (Signed)
Gastroenterology Pre-Procedure Review  Request Date: Monday 08/26/20 Requesting Physician: Dr. Marius Ditch  PATIENT REVIEW QUESTIONS: The patient responded to the following health history questions as indicated:    1. Are you having any GI issues? no 2. Do you have a personal history of Polyps? yes (Colon polyps noted on colonoscopy performed by Dr. Marius Ditch on 08/31/17) 3. Do you have a family history of Colon Cancer or Polyps? no 4. Diabetes Mellitus? no 5. Joint replacements in the past 12 months?no 6. Major health problems in the past 3 months?no 7. Any artificial heart valves, MVP, or defibrillator?no    MEDICATIONS & ALLERGIES:    Patient reports the following regarding taking any anticoagulation/antiplatelet therapy:   Plavix, Coumadin, Eliquis, Xarelto, Lovenox, Pradaxa, Brilinta, or Effient? no Aspirin? Yes 81 mg  Patient confirms/reports the following medications:  Current Outpatient Medications  Medication Sig Dispense Refill  . albuterol (PROVENTIL HFA;VENTOLIN HFA) 108 (90 BASE) MCG/ACT inhaler Inhale 2 puffs into the lungs every 6 (six) hours as needed for wheezing or shortness of breath. 1 Inhaler 2  . aspirin (ASPIRIN 81) 81 MG EC tablet Take 81 mg by mouth daily. Swallow whole.    . Biotin 10000 MCG TABS Take 10,000 mcg by mouth daily.    . calcium carbonate (OSCAL) 1500 (600 Ca) MG TABS tablet Take 600 mg of elemental calcium by mouth daily with breakfast.    . cetirizine (ZYRTEC) 10 MG tablet Take 10 mg by mouth daily.    . Coenzyme Q10 100 MG TABS Take 100 mg by mouth daily.     . cyanocobalamin (,VITAMIN B-12,) 1000 MCG/ML injection Inject 1,000 mcg into the muscle See admin instructions. Inject 1000 mcg intramuscularly every 60 days    . diclofenac sodium (VOLTAREN) 1 % GEL Apply 2 g topically 3 (three) times daily as needed (pain).   2  . furosemide (LASIX) 20 MG tablet Take 1 tablet (20 mg total) by mouth daily. 30 tablet 0  . metoprolol succinate (TOPROL-XL) 100 MG 24  hr tablet Take 1 tablet (100 mg total) by mouth daily. Take with or immediately following a meal. 30 tablet 0  . Multiple Vitamins-Minerals (MULTIVITAMIN GUMMIES ADULT PO) Take 1 tablet by mouth daily.     . Omega-3 Fatty Acids (FISH OIL) 1000 MG CAPS Take 1,000 mg by mouth daily.     . Oxymetazoline HCl (NASAL RELIEF NA) Place into the nose. Homeopathic formula    . Potassium Gluconate 550 (90 K) MG TABS Take 550 mg by mouth daily.    . Probiotic Product (PROBIOTIC & ACIDOPHILUS EX ST PO) Take 1 capsule by mouth daily.     . Vitamin D, Ergocalciferol, (DRISDOL) 1.25 MG (50000 UT) CAPS capsule Take 50,000 Units by mouth See admin instructions. Take 50000 units by mouth every 60 days with b12 injection    . acetaminophen (TYLENOL) 325 MG tablet Take 650 mg by mouth every 6 (six) hours as needed for moderate pain. (Patient not taking: No sig reported)    . docusate sodium (COLACE) 100 MG capsule Take 1 capsule (100 mg total) by mouth 2 (two) times daily. (Patient not taking: Reported on 07/24/2020) 20 capsule 0  . HYDROcodone-acetaminophen (NORCO/VICODIN) 5-325 MG tablet Take 1-2 tablets by mouth every 4 (four) hours as needed for moderate pain (pain score 4-6). (Patient not taking: Reported on 07/24/2020) 30 tablet 0  . methocarbamol (ROBAXIN) 500 MG tablet Take 1 tablet (500 mg total) by mouth every 6 (six) hours as needed for  muscle spasms. (Patient not taking: Reported on 07/24/2020) 30 tablet 0  . metoprolol tartrate (LOPRESSOR) 100 MG tablet Take 100 mg by mouth every evening.  (Patient not taking: Reported on 07/24/2020)    . rivaroxaban (XARELTO) 10 MG TABS tablet Take 1 tablet (10 mg total) by mouth daily with breakfast. May continue aspirin once xarelto discontinued (Patient not taking: Reported on 07/24/2020) 10 tablet 0  . traMADol (ULTRAM) 50 MG tablet Take 1 tablet (50 mg total) by mouth every 6 (six) hours as needed. (Patient not taking: No sig reported) 20 tablet 0   No current  facility-administered medications for this visit.    Patient confirms/reports the following allergies:  Allergies  Allergen Reactions  . Spiriva [Tiotropium Bromide Monohydrate] Other (See Comments)    CLOSES THROAT  . Shellfish Allergy Diarrhea and Nausea And Vomiting  . Penicillins     DOES NOT KNOW WHAT TYPE OF REACTION   Patient was 60 years old when she had this  . Codeine Nausea And Vomiting    Orders Placed This Encounter  Procedures  . Procedural/ Surgical Case Request: COLONOSCOPY WITH PROPOFOL    Standing Status:   Standing    Number of Occurrences:   1    Order Specific Question:   Pre-op diagnosis    Answer:   personal history of colon polyps    Order Specific Question:   CPT Code    Answer:   46286    AUTHORIZATION INFORMATION Primary Insurance: 1D#: Group #:  Secondary Insurance: 1D#: Group #:  SCHEDULE INFORMATION: Date: 08/26/20 Time: Location:[facility]

## 2020-08-22 ENCOUNTER — Other Ambulatory Visit
Admission: RE | Admit: 2020-08-22 | Discharge: 2020-08-22 | Disposition: A | Discharge status: Home / Self Care | Payer: 59 | Source: Ambulatory Visit | Attending: Gastroenterology | Admitting: Gastroenterology

## 2020-08-22 ENCOUNTER — Other Ambulatory Visit: Payer: Self-pay

## 2020-08-22 DIAGNOSIS — Z01812 Encounter for preprocedural laboratory examination: Secondary | ICD-10-CM | POA: Diagnosis present

## 2020-08-22 DIAGNOSIS — Z20822 Contact with and (suspected) exposure to covid-19: Secondary | ICD-10-CM | POA: Diagnosis not present

## 2020-08-22 LAB — SARS CORONAVIRUS 2 (TAT 6-24 HRS): SARS Coronavirus 2: NEGATIVE

## 2020-08-23 ENCOUNTER — Encounter: Payer: Self-pay | Admitting: Gastroenterology

## 2020-08-26 ENCOUNTER — Ambulatory Visit
Admission: RE | Admit: 2020-08-26 | Discharge: 2020-08-26 | Disposition: A | Discharge status: Home / Self Care | Payer: 59 | Source: Ambulatory Visit | Attending: Gastroenterology | Admitting: Gastroenterology

## 2020-08-26 ENCOUNTER — Encounter
Admission: RE | Disposition: A | Discharge status: Home / Self Care | Payer: Self-pay | Source: Ambulatory Visit | Attending: Gastroenterology

## 2020-08-26 ENCOUNTER — Ambulatory Visit: Payer: 59 | Admitting: Certified Registered Nurse Anesthetist

## 2020-08-26 ENCOUNTER — Encounter: Payer: Self-pay | Admitting: Gastroenterology

## 2020-08-26 DIAGNOSIS — Z888 Allergy status to other drugs, medicaments and biological substances status: Secondary | ICD-10-CM | POA: Insufficient documentation

## 2020-08-26 DIAGNOSIS — Z7982 Long term (current) use of aspirin: Secondary | ICD-10-CM | POA: Insufficient documentation

## 2020-08-26 DIAGNOSIS — Z91013 Allergy to seafood: Secondary | ICD-10-CM | POA: Diagnosis not present

## 2020-08-26 DIAGNOSIS — Z803 Family history of malignant neoplasm of breast: Secondary | ICD-10-CM | POA: Diagnosis not present

## 2020-08-26 DIAGNOSIS — Z86010 Personal history of colonic polyps: Secondary | ICD-10-CM | POA: Diagnosis not present

## 2020-08-26 DIAGNOSIS — Z79899 Other long term (current) drug therapy: Secondary | ICD-10-CM | POA: Diagnosis not present

## 2020-08-26 DIAGNOSIS — Z820 Family history of epilepsy and other diseases of the nervous system: Secondary | ICD-10-CM | POA: Insufficient documentation

## 2020-08-26 DIAGNOSIS — K5730 Diverticulosis of large intestine without perforation or abscess without bleeding: Secondary | ICD-10-CM | POA: Insufficient documentation

## 2020-08-26 DIAGNOSIS — Z801 Family history of malignant neoplasm of trachea, bronchus and lung: Secondary | ICD-10-CM | POA: Diagnosis not present

## 2020-08-26 DIAGNOSIS — D124 Benign neoplasm of descending colon: Secondary | ICD-10-CM | POA: Insufficient documentation

## 2020-08-26 DIAGNOSIS — F17210 Nicotine dependence, cigarettes, uncomplicated: Secondary | ICD-10-CM | POA: Diagnosis not present

## 2020-08-26 DIAGNOSIS — Z880 Allergy status to penicillin: Secondary | ICD-10-CM | POA: Insufficient documentation

## 2020-08-26 DIAGNOSIS — K644 Residual hemorrhoidal skin tags: Secondary | ICD-10-CM | POA: Insufficient documentation

## 2020-08-26 DIAGNOSIS — Z808 Family history of malignant neoplasm of other organs or systems: Secondary | ICD-10-CM | POA: Diagnosis not present

## 2020-08-26 DIAGNOSIS — Z1211 Encounter for screening for malignant neoplasm of colon: Secondary | ICD-10-CM | POA: Diagnosis not present

## 2020-08-26 DIAGNOSIS — K635 Polyp of colon: Secondary | ICD-10-CM | POA: Diagnosis not present

## 2020-08-26 DIAGNOSIS — I10 Essential (primary) hypertension: Secondary | ICD-10-CM | POA: Insufficient documentation

## 2020-08-26 DIAGNOSIS — Z791 Long term (current) use of non-steroidal anti-inflammatories (NSAID): Secondary | ICD-10-CM | POA: Insufficient documentation

## 2020-08-26 DIAGNOSIS — Z885 Allergy status to narcotic agent status: Secondary | ICD-10-CM | POA: Insufficient documentation

## 2020-08-26 HISTORY — PX: COLONOSCOPY WITH PROPOFOL: SHX5780

## 2020-08-26 SURGERY — COLONOSCOPY WITH PROPOFOL
Anesthesia: General

## 2020-08-26 MED ORDER — SODIUM CHLORIDE 0.9 % IV SOLN
INTRAVENOUS | Status: DC
  Administered 2020-08-26: 1000 mL via INTRAVENOUS

## 2020-08-26 MED ORDER — LIDOCAINE HCL (CARDIAC) PF 100 MG/5ML IV SOSY
PREFILLED_SYRINGE | INTRAVENOUS | Status: DC | PRN
  Administered 2020-08-26: 50 mg via INTRAVENOUS

## 2020-08-26 MED ORDER — ONDANSETRON HCL 4 MG/2ML IJ SOLN
INTRAMUSCULAR | Status: AC
  Filled 2020-08-26: qty 2

## 2020-08-26 MED ORDER — PROPOFOL 500 MG/50ML IV EMUL
INTRAVENOUS | Status: AC
  Filled 2020-08-26: qty 50

## 2020-08-26 MED ORDER — PROPOFOL 500 MG/50ML IV EMUL
INTRAVENOUS | Status: DC | PRN
  Administered 2020-08-26: 200 ug/kg/min via INTRAVENOUS

## 2020-08-26 MED ORDER — PROPOFOL 10 MG/ML IV BOLUS
INTRAVENOUS | Status: AC
  Filled 2020-08-26: qty 20

## 2020-08-26 MED ORDER — PHENYLEPHRINE HCL (PRESSORS) 10 MG/ML IV SOLN
INTRAVENOUS | Status: AC
  Filled 2020-08-26: qty 1

## 2020-08-26 MED ORDER — LIDOCAINE HCL (PF) 2 % IJ SOLN
INTRAMUSCULAR | Status: AC
  Filled 2020-08-26: qty 5

## 2020-08-26 MED ORDER — ONDANSETRON HCL 4 MG/2ML IJ SOLN
INTRAMUSCULAR | Status: DC | PRN
  Administered 2020-08-26: 4 mg via INTRAVENOUS

## 2020-08-26 MED ORDER — PROPOFOL 10 MG/ML IV BOLUS
INTRAVENOUS | Status: DC | PRN
  Administered 2020-08-26 (×2): 20 mg via INTRAVENOUS
  Administered 2020-08-26: 60 mg via INTRAVENOUS

## 2020-08-26 MED ORDER — GLYCOPYRROLATE 0.2 MG/ML IJ SOLN
INTRAMUSCULAR | Status: AC
  Filled 2020-08-26: qty 1

## 2020-08-26 MED ORDER — LIDOCAINE HCL (PF) 1 % IJ SOLN
INTRAMUSCULAR | Status: AC
  Filled 2020-08-26: qty 2

## 2020-08-26 NOTE — H&P (Signed)
Cephas Darby, MD [address]  [place]  [place],  [address]  Main: [phone]  Fax: [phone] Pager: [phone]  Primary Care Physician:  Elgie Collard, MD Primary Gastroenterologist:  Dr. Cephas Darby  Pre-Procedure History & Physical: HPI:  Brenda [name] is a 59 y.o. female is here for an colonoscopy.   Past Medical History:  Diagnosis Date  . Anemia   . Arthritis   . Asthma   . Atypical pneumonia    a. 12/2013  . B12 deficiency 10/07/2014  . Clostridium difficile colitis    a. 12/2013.was sick with pneumonia and sepsis  . Hypertension   . Lower extremity edema     Past Surgical History:  Procedure Laterality Date  . COLONOSCOPY WITH PROPOFOL N/A 08/30/2017   Procedure: COLONOSCOPY WITH PROPOFOL;  Surgeon: Lin Landsman, MD;  Location: [facility] ENDOSCOPY;  Service: Gastroenterology;  Laterality: N/A;  . COLONOSCOPY WITH PROPOFOL N/A 08/31/2017   Procedure: COLONOSCOPY WITH PROPOFOL;  Surgeon: Lin Landsman, MD;  Location: [facility] ENDOSCOPY;  Service: Gastroenterology;  Laterality: N/A;  . JOINT REPLACEMENT Right   . KNEE SURGERY Left 2014   arthroscopy. had to replace tendon with cadaver tissue  . NECK SURGERY  2014   metal plate in neck  . SHOULDER SURGERY Left 2012   arthroscopy  . THROAT SURGERY  2013   removed nodules.  benign  . TONSILLECTOMY    . TOTAL HIP ARTHROPLASTY Right 02/15/2019   Procedure: TOTAL HIP ARTHROPLASTY ANTERIOR APPROACH;  Surgeon: Lovell Sheehan, MD;  Location: [facility] ORS;  Service: Orthopedics;  Laterality: Right;    Prior to Admission medications   Medication Sig Start Date End Date Taking? Authorizing Provider  aspirin 81 MG EC tablet Take 81 mg by mouth daily. Swallow whole.   Yes [provider]  Biotin 10000 MCG TABS Take 10,000 mcg by mouth daily.   Yes [provider]  calcium carbonate (OSCAL) 1500 (600 Ca) MG TABS tablet Take 600 mg of elemental calcium by mouth daily with  breakfast.   Yes [provider]  cetirizine (ZYRTEC) 10 MG tablet Take 10 mg by mouth daily.   Yes [provider]  Coenzyme Q10 100 MG TABS Take 100 mg by mouth daily.    Yes [provider]  cyanocobalamin (,VITAMIN B-12,) 1000 MCG/ML injection Inject 1,000 mcg into the muscle See admin instructions. Inject 1000 mcg intramuscularly every 60 days   Yes [provider]  diclofenac sodium (VOLTAREN) 1 % GEL Apply 2 g topically 3 (three) times daily as needed (pain).  01/21/17  Yes [provider]  furosemide (LASIX) 20 MG tablet Take 1 tablet (20 mg total) by mouth daily. 01/11/14  Yes Theora Gianotti, NP  metoprolol succinate (TOPROL-XL) 100 MG 24 hr tablet Take 1 tablet (100 mg total) by mouth daily. Take with or immediately following a meal. 02/21/19  Yes Carlynn Spry, PA-C  Multiple Vitamins-Minerals (MULTIVITAMIN GUMMIES ADULT PO) Take 1 tablet by mouth daily.    Yes [provider]  Omega-3 Fatty Acids (FISH OIL) 1000 MG CAPS Take 1,000 mg by mouth daily.    Yes [provider]  Oxymetazoline HCl (NASAL RELIEF NA) Place into the nose. Homeopathic formula   Yes [provider]  Potassium Gluconate 550 (90 K) MG TABS Take 550 mg by mouth daily.   Yes [provider]  Probiotic Product (PROBIOTIC & ACIDOPHILUS EX ST PO) Take 1 capsule by mouth daily.  Yes [provider]  Vitamin D, Ergocalciferol, (DRISDOL) 1.25 MG (50000 UT) CAPS capsule Take 50,000 Units by mouth See admin instructions. Take 50000 units by mouth every 60 days with b12 injection   Yes [provider]  acetaminophen (TYLENOL) 325 MG tablet Take 650 mg by mouth every 6 (six) hours as needed for moderate pain. Patient not taking: No sig reported    [provider]  albuterol (PROVENTIL HFA;VENTOLIN HFA) 108 (90 BASE) MCG/ACT inhaler Inhale 2 puffs into the lungs every 6 (six) hours as needed for wheezing or shortness  of breath. 01/30/14   Juanito Doom, MD  HYDROcodone-acetaminophen (NORCO/VICODIN) 5-325 MG tablet Take 1-2 tablets by mouth every 4 (four) hours as needed for moderate pain (pain score 4-6). Patient not taking: Reported on 07/24/2020 02/17/19   Carlynn Spry, PA-C  traMADol (ULTRAM) 50 MG tablet Take 1 tablet (50 mg total) by mouth every 6 (six) hours as needed. Patient not taking: No sig reported 12/18/18   Versie Starks, PA-C    Allergies as of 07/24/2020 - Review Complete 07/24/2020  Allergen Reaction Noted  . Spiriva [tiotropium bromide monohydrate] Other (See Comments) 02/20/2013  . Shellfish allergy Diarrhea and Nausea And Vomiting 02/20/2013  . Penicillins  02/20/2013  . Codeine Nausea And Vomiting 02/20/2013    Family History  Problem Relation Age of Onset  . Multiple myeloma Mother   . Breast cancer Maternal Aunt   . Lung cancer Maternal Grandmother   . Parkinson's disease Father     Social History   Socioeconomic History  . Marital status: Divorced    Spouse name: Not on file  . Number of children: Not on file  . Years of education: Not on file  . Highest education level: Not on file  Occupational History  . Not on file  Tobacco Use  . Smoking status: Current Some Day Smoker    Packs/day: 0.50    Years: 8.00    Pack years: 4.00    Types: Cigarettes  . Smokeless tobacco: Never Used  . Tobacco comment: smokes about 2 cigs per day  Vaping Use  . Vaping Use: Never used  Substance and Sexual Activity  . Alcohol use: Yes    Comment: SOCIAL DRINKER 1-2 vodka a week  . Drug use: No  . Sexual activity: Not on file  Other Topics Concern  . Not on file  Social History Narrative   Lives alone.  Sister, Juliann Pulse from [place] will be coming to stay with patient for 2 weeks.   Social Determinants of Health   Financial Resource Strain: Not on file  Food Insecurity: Not on file  Transportation Needs: Not on file  Physical Activity: Not on file  Stress: Not on  file  Social Connections: Not on file  Intimate Partner Violence: Not on file    Review of Systems: See HPI, otherwise negative ROS  Physical Exam: BP (!) 128/96   Pulse (!) 116   Temp 98.6 F (37 C) (Temporal)   Resp 18   Ht 5' 4.5" (1.638 m)   Wt 83.2 kg   SpO2 100%   BMI 30.99 kg/m  General:   Alert,  pleasant and cooperative in NAD Head:  Normocephalic and atraumatic. Neck:  Supple; no masses or thyromegaly. Lungs:  Clear throughout to auscultation.    Heart:  Regular rate and rhythm. Abdomen:  Soft, nontender and nondistended. Normal bowel sounds, without guarding, and without rebound.   Neurologic:  Alert and  oriented x4;  grossly normal neurologically.  Impression/Plan: Brenda [name] is here for an colonoscopy to be performed for h/o colon polyps  Risks, benefits, limitations, and alternatives regarding  colonoscopy have been reviewed with the patient.  Questions have been answered.  All parties agreeable.   Sherri Sear, MD  08/26/2020, 8:07 AM

## 2020-08-26 NOTE — Anesthesia Postprocedure Evaluation (Signed)
Anesthesia Post Note  Patient: Brenda [name]  Procedure(s) Performed: COLONOSCOPY WITH PROPOFOL (N/A )  Patient location during evaluation: Endoscopy Anesthesia Type: General Level of consciousness: awake and alert Pain management: pain level controlled Vital Signs Assessment: post-procedure vital signs reviewed and stable Respiratory status: spontaneous breathing, nonlabored ventilation, respiratory function stable and patient connected to nasal cannula oxygen Cardiovascular status: blood pressure returned to baseline and stable Postop Assessment: no apparent nausea or vomiting Anesthetic complications: no   No complications documented.   Last Vitals:  Vitals:   08/26/20 0846 08/26/20 0856  BP: (!) 115/92 (!) 143/95  Pulse: 93 90  Resp: (!) 22 18  Temp:    SpO2: 96% 99%    Last Pain:  Vitals:   08/26/20 0856  TempSrc:   PainSc: 0-No pain                 Arita Miss

## 2020-08-26 NOTE — Transfer of Care (Signed)
Immediate Anesthesia Transfer of Care Note  Patient: Brenda [name]  Procedure(s) Performed: COLONOSCOPY WITH PROPOFOL (N/A )  Patient Location: PACU and Endoscopy Unit  Anesthesia Type:General  Level of Consciousness: awake, alert  and oriented  Airway & Oxygen Therapy: Patient Spontanous Breathing  Post-op Assessment: Report given to RN  Post vital signs: Reviewed and stable  Last Vitals:  Vitals Value Taken Time  BP 115/85 08/26/20 0837  Temp 36.4 C 08/26/20 0836  Pulse 88 08/26/20 0838  Resp 21 08/26/20 0838  SpO2 99 % 08/26/20 0838  Vitals shown include unvalidated device data.  Last Pain:  Vitals:   08/26/20 0836  TempSrc: Temporal  PainSc: 0-No pain         Complications: No complications documented.

## 2020-08-26 NOTE — Anesthesia Preprocedure Evaluation (Signed)
Anesthesia Evaluation  Patient identified by MRN, date of birth, ID band Patient awake    Reviewed: Allergy & Precautions, NPO status , Patient's Chart, lab work & pertinent test results  History of Anesthesia Complications Negative for: history of anesthetic complications  Airway Mallampati: III  TM Distance: >3 FB Neck ROM: Full    Dental no notable dental hx. (+) Teeth Intact   Pulmonary asthma , neg sleep apnea, COPD,  COPD inhaler, Current SmokerPatient did not abstain from smoking.,    Pulmonary exam normal breath sounds clear to auscultation       Cardiovascular Exercise Tolerance: Good METShypertension, Pt. on medications +CHF (diastolic dysfunction)  (-) CAD and (-) Past MI (-) dysrhythmias (-) Valvular Problems/Murmurs Rhythm:Regular Rate:Normal - Systolic murmurs    Neuro/Psych neg Seizures    GI/Hepatic Neg liver ROS, neg GERD  ,  Endo/Other  neg diabetes  Renal/GU negative Renal ROS     Musculoskeletal   Abdominal   Peds  Hematology  (+) anemia ,   Anesthesia Other Findings Past Medical History: No date: Anemia No date: Arthritis No date: Asthma No date: Atypical pneumonia     Comment:  a. 12/2013 10/07/2014: B12 deficiency No date: Clostridium difficile colitis     Comment:  a. 12/2013.was sick with pneumonia and sepsis No date: Hypertension No date: Lower extremity edema  Reproductive/Obstetrics                             Anesthesia Physical  Anesthesia Plan  ASA: III  Anesthesia Plan: General   Post-op Pain Management:    Induction: Intravenous  PONV Risk Score and Plan: 2 and Ondansetron, Propofol infusion and TIVA  Airway Management Planned: Nasal Cannula  Additional Equipment: None  Intra-op Plan:   Post-operative Plan:   Informed Consent: I have reviewed the patients History and Physical, chart, labs and discussed the procedure including the  risks, benefits and alternatives for the proposed anesthesia with the patient or authorized representative who has indicated his/her understanding and acceptance.     Dental advisory given  Plan Discussed with: CRNA and Surgeon  Anesthesia Plan Comments: (Discussed risks of anesthesia with patient, including possibility of difficulty with spontaneous ventilation under anesthesia necessitating airway intervention, PONV, and rare risks such as cardiac or respiratory or neurological events. Patient understands. Patient counseled on benefits of smoking cessation, and increased perioperative risks associated with continued smoking. )        Anesthesia Quick Evaluation

## 2020-08-26 NOTE — Op Note (Signed)
[facility] Gastroenterology Patient Name: Brenda [name] Procedure Date: 08/26/2020 8:13 AM MRN: [id number] Account #: [id number] Date of Birth: 1960/06/30 Admit Type: Outpatient Age: 60 Room: [facility] ENDO ROOM 2 Gender: Female Note Status: Finalized Procedure:             Colonoscopy Indications:           Surveillance: Personal history of adenomatous polyps                         on last colonoscopy 3 years ago, Last colonoscopy:                         April 2019 Providers:             Lin Landsman MD, MD Referring MD:          Shelby Mattocks. Georga Bora, MD (Referring MD) Medicines:             General Anesthesia Complications:         No immediate complications. Estimated blood loss: None. Procedure:             Pre-Anesthesia Assessment:                        - Prior to the procedure, a History and Physical was                         performed, and patient medications and allergies were                         reviewed. The patient is competent. The risks and                         benefits of the procedure and the sedation options and                         risks were discussed with the patient. All questions                         were answered and informed consent was obtained.                         Patient identification and proposed procedure were                         verified by the physician, the nurse, the                         anesthesiologist, the anesthetist and the technician                         in the pre-procedure area in the procedure room in the                         endoscopy suite. Mental Status Examination: alert and                         oriented. Airway Examination: normal oropharyngeal  airway and neck mobility. Respiratory Examination:                         clear to auscultation. CV Examination: normal.                         Prophylactic Antibiotics: The patient does not require                          prophylactic antibiotics. Prior Anticoagulants: The                         patient has taken no previous anticoagulant or                         antiplatelet agents. ASA Grade Assessment: III - A                         patient with severe systemic disease. After reviewing                         the risks and benefits, the patient was deemed in                         satisfactory condition to undergo the procedure. The                         anesthesia plan was to use general anesthesia.                         Immediately prior to administration of medications,                         the patient was re-assessed for adequacy to receive                         sedatives. The heart rate, respiratory rate, oxygen                         saturations, blood pressure, adequacy of pulmonary                         ventilation, and response to care were monitored                         throughout the procedure. The physical status of the                         patient was re-assessed after the procedure.                        After obtaining informed consent, the colonoscope was                         passed under direct vision. Throughout the procedure,                         the patient's blood pressure, pulse, and oxygen  saturations were monitored continuously. The                         Colonoscope was introduced through the anus and                         advanced to the the cecum, identified by appendiceal                         orifice and ileocecal valve. The colonoscopy was                         performed without difficulty. The patient tolerated                         the procedure well. The quality of the bowel                         preparation was evaluated using the BBPS [facility] Bowel                         Preparation Scale) with scores of: Right Colon = 3,                         Transverse Colon = 3 and Left Colon = 3 (entire  mucosa                         seen well with no residual staining, small fragments                         of stool or opaque liquid). The total BBPS score                         equals 9. Findings:      The perianal and digital rectal examinations were normal. Pertinent       negatives include normal sphincter tone and no palpable rectal lesions.      Two sessile polyps were found in the descending colon. The polyps were       diminutive in size. These polyps were removed with a cold biopsy       forceps. Resection and retrieval were complete.      Multiple diverticula were found in the entire colon.      Non-bleeding external hemorrhoids were found during retroflexion. The       hemorrhoids were medium-sized. Impression:            - Two diminutive polyps in the descending colon,                         removed with a cold biopsy forceps. Resected and                         retrieved.                        - Diverticulosis in the entire examined colon.                        - Non-bleeding external hemorrhoids. Recommendation:        -  Discharge patient to home (with escort).                        - Resume previous diet today.                        - Continue present medications.                        - Await pathology results.                        - Repeat colonoscopy in 5 years for surveillance. Procedure Code(s):     --- Professional ---                        [phone], Colonoscopy, flexible; with biopsy, single or                         multiple Diagnosis Code(s):     --- Professional ---                        Z86.010, Personal history of colonic polyps                        K63.5, Polyp of colon                        K64.4, Residual hemorrhoidal skin tags                        K57.30, Diverticulosis of large intestine without                         perforation or abscess without bleeding CPT copyright 2019 American Medical Association. All rights reserved. The  codes documented in this report are preliminary and upon coder review may  be revised to meet current compliance requirements. Dr. Ulyess Mort Lin Landsman MD, MD 08/26/2020 8:35:01 AM This report has been signed electronically. Number of Addenda: 0 Note Initiated On: 08/26/2020 8:13 AM Scope Withdrawal Time: 0 hours 10 minutes 13 seconds  Total Procedure Duration: 0 hours 12 minutes 40 seconds  Estimated Blood Loss:  Estimated blood loss: none.      [facility]

## 2020-08-27 ENCOUNTER — Encounter: Payer: Self-pay | Admitting: Gastroenterology

## 2020-08-27 LAB — SURGICAL PATHOLOGY

## 2020-08-28 ENCOUNTER — Encounter: Payer: Self-pay | Admitting: Gastroenterology

## 2020-09-20 ENCOUNTER — Ambulatory Visit
Admission: EM | Admit: 2020-09-20 | Discharge: 2020-09-20 | Disposition: A | Discharge status: Home / Self Care | Payer: 59 | Attending: Emergency Medicine | Admitting: Emergency Medicine

## 2020-09-20 ENCOUNTER — Other Ambulatory Visit: Payer: Self-pay

## 2020-09-20 DIAGNOSIS — J0100 Acute maxillary sinusitis, unspecified: Secondary | ICD-10-CM

## 2020-09-20 DIAGNOSIS — Z1152 Encounter for screening for COVID-19: Secondary | ICD-10-CM

## 2020-09-20 MED ORDER — AZITHROMYCIN 250 MG PO TABS: 250.0000 mg | ORAL_TABLET | Freq: Every day | ORAL | 0 refills | Status: DC

## 2020-09-20 NOTE — ED Triage Notes (Signed)
Pt reports having nasal congestion x week. Also reports having a non productive cough that began yesterday.

## 2020-09-20 NOTE — ED Provider Notes (Signed)
Roderic Palau    CSN: [id number] Arrival date & time: 09/20/20  1432      History   Chief Complaint Chief Complaint  Patient presents with  . Nasal Congestion    HPI Brenda [name] is a 60 y.o. female.   Patient presents with 8 day history of sinus congestion, sinus pressure, postnasal drip, yellow nasal drainage.  She also has a nonproductive cough since yesterday.  She denies fever, chills, sore throat, shortness of breath, vomiting, diarrhea, or other symptoms.  Treatment attempted at home with Sudafed and Robitussin.  Her medical history includes COPD, hypertension, congestive heart failure, c.diff colitis.  The history is provided by the patient and medical records.    Past Medical History:  Diagnosis Date  . Anemia   . Arthritis   . Asthma   . Atypical pneumonia    a. 12/2013  . B12 deficiency 10/07/2014  . Clostridium difficile colitis    a. 12/2013.was sick with pneumonia and sepsis  . Hypertension   . Lower extremity edema     Patient Active Problem List   Diagnosis Date Noted  . History of colonic polyps   . Anemia 07/24/2020  . 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 Encounter for screening colonoscopy   . B12 deficiency 10/07/2014  . SOB (shortness of breath) 01/09/2014  . Chronic diastolic CHF (congestive heart failure) ([place])   . Atypical pneumonia   . Clostridium difficile colitis   . COPD (chronic obstructive pulmonary disease) ([place])   . S/P ACL repair 10/24/2013  . 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 Peroneal tendinitis of left lower extremity 02/20/2013    Past Surgical History:  Procedure Laterality Date  . COLONOSCOPY WITH PROPOFOL N/A 08/30/2017   Procedure: COLONOSCOPY WITH PROPOFOL;  Surgeon: Lin Landsman, MD;  Location: [facility] ENDOSCOPY;  Service: Gastroenterology;  Laterality: N/A;  . COLONOSCOPY WITH PROPOFOL N/A 08/31/2017   Procedure: COLONOSCOPY WITH PROPOFOL;  Surgeon: Lin Landsman, MD;  Location: [facility]  ENDOSCOPY;  Service: Gastroenterology;  Laterality: N/A;  . COLONOSCOPY WITH PROPOFOL N/A 08/26/2020   Procedure: COLONOSCOPY WITH PROPOFOL;  Surgeon: Lin Landsman, MD;  Location[facility] ENDOSCOPY;  Service: Gastroenterology;  Laterality: N/A;  . JOINT REPLACEMENT Right   . KNEE SURGERY Left 2014   arthroscopy. had to replace tendon with cadaver tissue  . NECK SURGERY  2014   metal plate in neck  . SHOULDER SURGERY Left 2012   arthroscopy  . THROAT SURGERY  2013   removed nodules.  benign  . TONSILLECTOMY    . TOTAL HIP ARTHROPLASTY Right 02/15/2019   Procedure: TOTAL HIP ARTHROPLASTY ANTERIOR APPROACH;  Surgeon: Lovell Sheehan, MD;  Location: [facility] ORS;  Service: Orthopedics;  Laterality: Right;    OB History   No obstetric history on file.      Home Medications    Prior to Admission medications   Medication Sig Start Date End Date Taking? Authorizing Provider  azithromycin (ZITHROMAX) 250 MG tablet Take 1 tablet (250 mg total) by mouth daily. Take first 2 tablets together, then 1 every day until finished. 09/20/20  Yes Sharion Balloon, NP  acetaminophen (TYLENOL) 325 MG tablet Take 650 mg by mouth every 6 (six) hours as needed for moderate pain. Patient not taking: No sig reported    [provider]  albuterol (PROVENTIL HFA;VENTOLIN HFA) 108 (90 BASE) MCG/ACT inhaler Inhale 2 puffs into the lungs every 6 (six) hours as needed for wheezing or shortness of  breath. 01/30/14   Juanito Doom, MD  aspirin 81 MG EC tablet Take 81 mg by mouth daily. Swallow whole.    [provider]  Biotin 10000 MCG TABS Take 10,000 mcg by mouth daily.    [provider]  calcium carbonate (OSCAL) 1500 (600 Ca) MG TABS tablet Take 600 mg of elemental calcium by mouth daily with breakfast.    [provider]  cetirizine (ZYRTEC) 10 MG tablet Take 10 mg by mouth daily.    [provider]  Coenzyme Q10 100 MG TABS Take 100 mg by mouth daily.     [provider]  cyanocobalamin (,VITAMIN B-12,) 1000 MCG/ML injection Inject 1,000 mcg into the muscle See admin instructions. Inject 1000 mcg intramuscularly every 60 days    [provider]  diclofenac sodium (VOLTAREN) 1 % GEL Apply 2 g topically 3 (three) times daily as needed (pain).  01/21/17   [provider]  furosemide (LASIX) 20 MG tablet Take 1 tablet (20 mg total) by mouth daily. 01/11/14   Theora Gianotti, NP  HYDROcodone-acetaminophen (NORCO/VICODIN) 5-325 MG tablet Take 1-2 tablets by mouth every 4 (four) hours as needed for moderate pain (pain score 4-6). Patient not taking: Reported on 07/24/2020 02/17/19   Carlynn Spry, PA-C  metoprolol succinate (TOPROL-XL) 100 MG 24 hr tablet Take 1 tablet (100 mg total) by mouth daily. Take with or immediately following a meal. 02/21/19   Carlynn Spry, PA-C  Multiple Vitamins-Minerals (MULTIVITAMIN GUMMIES ADULT PO) Take 1 tablet by mouth daily.     [provider]  Omega-3 Fatty Acids (FISH OIL) 1000 MG CAPS Take 1,000 mg by mouth daily.     [provider]  Oxymetazoline HCl (NASAL RELIEF NA) Place into the nose. Homeopathic formula    [provider]  Potassium Gluconate 550 (90 K) MG TABS Take 550 mg by mouth daily.    [provider]  Probiotic Product (PROBIOTIC & ACIDOPHILUS EX ST PO) Take 1 capsule by mouth daily.     [provider]  traMADol (ULTRAM) 50 MG tablet Take 1 tablet (50 mg total) by mouth every 6 (six) hours as needed. Patient not taking: No sig reported 12/18/18   Versie Starks, PA-C  Vitamin D, Ergocalciferol, (DRISDOL) 1.25 MG (50000 UT) CAPS capsule Take 50,000 Units by mouth See admin instructions. Take 50000 units by mouth every 60 days with b12 injection    [provider]    Family History Family History  Problem Relation Age of Onset  . Multiple myeloma Mother   . Breast cancer Maternal Aunt   . Lung cancer Maternal Grandmother   .  Parkinson's disease Father     Social History Social History   Tobacco Use  . Smoking status: Current Some Day Smoker    Packs/day: 0.50    Years: 8.00    Pack years: 4.00    Types: Cigarettes  . Smokeless tobacco: Never Used  . Tobacco comment: smokes about 2 cigs per day  Vaping Use  . Vaping Use: Never used  Substance Use Topics  . Alcohol use: Yes    Comment: SOCIAL DRINKER 1-2 vodka a week  . Drug use: No     Allergies   Spiriva [tiotropium bromide monohydrate], Shellfish allergy, Penicillins, and Codeine   Review of Systems Review of Systems  Constitutional: Negative for chills and fever.  HENT: Positive for congestion, postnasal drip, rhinorrhea and sinus pressure. Negative for ear pain and sore  throat.   Respiratory: Positive for cough. Negative for shortness of breath.   Cardiovascular: Negative for chest pain and palpitations.  Gastrointestinal: Negative for abdominal pain, diarrhea and vomiting.  Skin: Negative for color change and rash.  All other systems reviewed and are negative.    Physical Exam Triage Vital Signs ED Triage Vitals  Enc Vitals Group     BP      Pulse      Resp      Temp      Temp src      SpO2      Weight      Height      Head Circumference      Peak Flow      Pain Score      Pain Loc      Pain Edu?      Excl. in [place]?    No data found.  Updated Vital Signs BP (!) 148/82   Pulse 85   Temp 98.9 F (37.2 C) (Oral)   Resp 16   Ht 5' 4.5" (1.638 m)   Wt 182 lb (82.6 kg)   SpO2 98%   BMI 30.76 kg/m   Visual Acuity Right Eye Distance:   Left Eye Distance:   Bilateral Distance:    Right Eye Near:   Left Eye Near:    Bilateral Near:     Physical Exam Vitals and nursing note reviewed.  Constitutional:      General: She is not in acute distress.    Appearance: She is well-developed. She is not ill-appearing.  HENT:     Head: Normocephalic and atraumatic.     Right Ear: Tympanic membrane normal.     Left Ear:  Tympanic membrane normal.     Nose: Congestion and rhinorrhea present.     Mouth/Throat:     Mouth: Mucous membranes are moist.     Pharynx: Oropharynx is clear.  Eyes:     Conjunctiva/sclera: Conjunctivae normal.  Cardiovascular:     Rate and Rhythm: Normal rate and regular rhythm.     Heart sounds: Normal heart sounds.  Pulmonary:     Effort: Pulmonary effort is normal. No respiratory distress.     Breath sounds: Normal breath sounds.  Abdominal:     Palpations: Abdomen is soft.     Tenderness: There is no abdominal tenderness.  Musculoskeletal:     Cervical back: Neck supple.  Skin:    General: Skin is warm and dry.  Neurological:     General: No focal deficit present.     Mental Status: She is alert and oriented to person, place, and time.  Psychiatric:        Mood and Affect: Mood normal.        Behavior: Behavior normal.      UC Treatments / Results  Labs (all labs ordered are listed, but only abnormal results are displayed) Labs Reviewed  NOVEL CORONAVIRUS, NAA    EKG   Radiology No results found.  Procedures Procedures (including critical care time)  Medications Ordered in UC Medications - No data to display  Initial Impression / Assessment and Plan / UC Course  I have reviewed the triage vital signs and the nursing notes.  Pertinent labs & imaging results that were available during my care of the patient were reviewed by me and considered in my medical decision making (see chart for details).   Acute sinusitis.  COVID test.  Patient has been symptomatic for  over a week and has yellow sinus drainage.  Treating with Zithromax.  PCR COVID pending.  Instructed patient to self quarantine until the test result is back.  Discussed other symptomatic treatment including Tylenol as needed.  Instructed her to follow-up with her PCP if her symptoms are not improving.  She agrees to plan of care.   Final Clinical Impressions(s) / UC Diagnoses   Final diagnoses:   Encounter for screening for COVID-19  Acute non-recurrent maxillary sinusitis     Discharge Instructions     Take the Zithromax as directed.    Your COVID test is pending.  You should self quarantine until the test result is back.    Take Tylenol as needed for fever or discomfort.  Rest and keep yourself hydrated.    Follow-up with your primary care provider if your symptoms are not improving.        ED Prescriptions    Medication Sig Dispense Auth. Provider   azithromycin (ZITHROMAX) 250 MG tablet Take 1 tablet (250 mg total) by mouth daily. Take first 2 tablets together, then 1 every day until finished. 6 tablet Sharion Balloon, NP     PDMP not reviewed this encounter.   Sharion Balloon, NP 09/20/20 [phone]

## 2020-09-20 NOTE — Discharge Instructions (Signed)
Take the Zithromax as directed.    Your COVID test is pending.  You should self quarantine until the test result is back.    Take Tylenol as needed for fever or discomfort.  Rest and keep yourself hydrated.    Follow-up with your primary care provider if your symptoms are not improving.

## 2020-09-21 LAB — NOVEL CORONAVIRUS, NAA: SARS-CoV-2, NAA: NOT DETECTED

## 2020-09-21 LAB — SARS-COV-2, NAA 2 DAY TAT

## 2020-10-17 ENCOUNTER — Other Ambulatory Visit: Payer: Self-pay

## 2020-10-17 ENCOUNTER — Ambulatory Visit
Admission: EM | Admit: 2020-10-17 | Discharge: 2020-10-17 | Disposition: A | Discharge status: Home / Self Care | Payer: 59 | Attending: Emergency Medicine | Admitting: Emergency Medicine

## 2020-10-17 DIAGNOSIS — I10 Essential (primary) hypertension: Secondary | ICD-10-CM | POA: Diagnosis not present

## 2020-10-17 DIAGNOSIS — Z1152 Encounter for screening for COVID-19: Secondary | ICD-10-CM | POA: Diagnosis not present

## 2020-10-17 DIAGNOSIS — J069 Acute upper respiratory infection, unspecified: Secondary | ICD-10-CM | POA: Diagnosis not present

## 2020-10-17 NOTE — ED Provider Notes (Signed)
Roderic Palau    CSN: [id number] Arrival date & time: 10/17/20  0901      History   Chief Complaint Chief Complaint  Patient presents with  . Cough    HPI Brenda [name] is a 60 y.o. female.   Patient was seen here on 09/20/2020; diagnosed with acute sinusitis; treated with Zithromax; COVID-negative at that time.  Her symptoms completely resolved.  She received the COVID booster on 10/11/2020.  She developed nasal congestion and cough 2 days ago; positive at-home COVID test this morning.  She has been taking Sudafed and Mucinex.  She denies fever, shortness of breath, vomiting, diarrhea, or other symptoms.  Her medical history includes hypertension, COPD, asthma, anemia, arthritis.  The history is provided by the patient and medical records.    Past Medical History:  Diagnosis Date  . Anemia   . Arthritis   . Asthma   . Atypical pneumonia    a. 12/2013  . B12 deficiency 10/07/2014  . Clostridium difficile colitis    a. 12/2013.was sick with pneumonia and sepsis  . Hypertension   . Lower extremity edema     Patient Active Problem List   Diagnosis Date Noted  . History of colonic polyps   . Anemia 07/24/2020  . Osteonecrosis of right hip ([place]) 02/15/2019  . Encounter for screening colonoscopy   . B12 deficiency 10/07/2014  . SOB (shortness of breath) 01/09/2014  . Chronic diastolic CHF (congestive heart failure) ([place])   . Atypical pneumonia   . Clostridium difficile colitis   . COPD (chronic obstructive pulmonary disease) ([place])   . S/P ACL repair 10/24/2013  . 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 Peroneal tendinitis of left lower extremity 02/20/2013    Past Surgical History:  Procedure Laterality Date  . COLONOSCOPY WITH PROPOFOL N/A 08/30/2017   Procedure: COLONOSCOPY WITH PROPOFOL;  Surgeon: Lin Landsman, MD;  Location: [facility] ENDOSCOPY;  Service: Gastroenterology;  Laterality: N/A;  . COLONOSCOPY WITH PROPOFOL N/A 08/31/2017   Procedure: COLONOSCOPY  WITH PROPOFOL;  Surgeon: Lin Landsman, MD;  Location: [facility] ENDOSCOPY;  Service: Gastroenterology;  Laterality: N/A;  . COLONOSCOPY WITH PROPOFOL N/A 08/26/2020   Procedure: COLONOSCOPY WITH PROPOFOL;  Surgeon: Lin Landsman, MD;  Location: [facility] ENDOSCOPY;  Service: Gastroenterology;  Laterality: N/A;  . JOINT REPLACEMENT Right   . KNEE SURGERY Left 2014   arthroscopy. had to replace tendon with cadaver tissue  . NECK SURGERY  2014   metal plate in neck  . SHOULDER SURGERY Left 2012   arthroscopy  . THROAT SURGERY  2013   removed nodules.  benign  . TONSILLECTOMY    . TOTAL HIP ARTHROPLASTY Right 02/15/2019   Procedure: TOTAL HIP ARTHROPLASTY ANTERIOR APPROACH;  Surgeon: Lovell Sheehan, MD;  Location: [facility] ORS;  Service: Orthopedics;  Laterality: Right;    OB History   No obstetric history on file.      Home Medications    Prior to Admission medications   Medication Sig Start Date End Date Taking? Authorizing Provider  acetaminophen (TYLENOL) 325 MG tablet Take 650 mg by mouth every 6 (six) hours as needed for moderate pain. Patient not taking: No sig reported    [provider]  albuterol (PROVENTIL HFA;VENTOLIN HFA) 108 (90 BASE) MCG/ACT inhaler Inhale 2 puffs into the lungs every 6 (six) hours as needed for wheezing or shortness of breath. 01/30/14   Juanito Doom, MD  aspirin 81 MG EC tablet Take 81 mg by mouth  daily. Swallow whole.    [provider]  azithromycin (ZITHROMAX) 250 MG tablet Take 1 tablet (250 mg total) by mouth daily. Take first 2 tablets together, then 1 every day until finished. 09/20/20   Sharion Balloon, NP  Biotin 10000 MCG TABS Take 10,000 mcg by mouth daily.    [provider]  calcium carbonate (OSCAL) 1500 (600 Ca) MG TABS tablet Take 600 mg of elemental calcium by mouth daily with breakfast.    [provider]  cetirizine (ZYRTEC) 10 MG tablet Take 10 mg by mouth daily.    [provider]   Coenzyme Q10 100 MG TABS Take 100 mg by mouth daily.     [provider]  cyanocobalamin (,VITAMIN B-12,) 1000 MCG/ML injection Inject 1,000 mcg into the muscle See admin instructions. Inject 1000 mcg intramuscularly every 60 days    [provider]  diclofenac sodium (VOLTAREN) 1 % GEL Apply 2 g topically 3 (three) times daily as needed (pain).  01/21/17   [provider]  furosemide (LASIX) 20 MG tablet Take 1 tablet (20 mg total) by mouth daily. 01/11/14   Theora Gianotti, NP  HYDROcodone-acetaminophen (NORCO/VICODIN) 5-325 MG tablet Take 1-2 tablets by mouth every 4 (four) hours as needed for moderate pain (pain score 4-6). Patient not taking: Reported on 07/24/2020 02/17/19   Carlynn Spry, PA-C  metoprolol succinate (TOPROL-XL) 100 MG 24 hr tablet Take 1 tablet (100 mg total) by mouth daily. Take with or immediately following a meal. 02/21/19   Carlynn Spry, PA-C  Multiple Vitamins-Minerals (MULTIVITAMIN GUMMIES ADULT PO) Take 1 tablet by mouth daily.     [provider]  Omega-3 Fatty Acids (FISH OIL) 1000 MG CAPS Take 1,000 mg by mouth daily.     [provider]  Oxymetazoline HCl (NASAL RELIEF NA) Place into the nose. Homeopathic formula    [provider]  Potassium Gluconate 550 (90 K) MG TABS Take 550 mg by mouth daily.    [provider]  Probiotic Product (PROBIOTIC & ACIDOPHILUS EX ST PO) Take 1 capsule by mouth daily.     [provider]  traMADol (ULTRAM) 50 MG tablet Take 1 tablet (50 mg total) by mouth every 6 (six) hours as needed. Patient not taking: No sig reported 12/18/18   Versie Starks, PA-C  Vitamin D, Ergocalciferol, (DRISDOL) 1.25 MG (50000 UT) CAPS capsule Take 50,000 Units by mouth See admin instructions. Take 50000 units by mouth every 60 days with b12 injection    [provider]    Family History Family History  Problem Relation Age of Onset  . Multiple myeloma Mother   .  Breast cancer Maternal Aunt   . Lung cancer Maternal Grandmother   . Parkinson's disease Father     Social History Social History   Tobacco Use  . Smoking status: Current Some Day Smoker    Packs/day: 0.50    Years: 8.00    Pack years: 4.00    Types: Cigarettes  . Smokeless tobacco: Never Used  . Tobacco comment: smokes about 2 cigs per day  Vaping Use  . Vaping Use: Never used  Substance Use Topics  . Alcohol use: Yes    Comment: SOCIAL DRINKER 1-2 vodka a week  . Drug use: No     Allergies   Spiriva [tiotropium bromide monohydrate], Shellfish allergy, Penicillins, and Codeine   Review of Systems Review of Systems  Constitutional: Negative for chills and fever.  HENT: Positive  for congestion. Negative for ear pain and sore throat.   Respiratory: Positive for cough. Negative for shortness of breath.   Cardiovascular: Negative for chest pain and palpitations.  Gastrointestinal: Negative for abdominal pain, diarrhea and vomiting.  Skin: Negative for color change and rash.  All other systems reviewed and are negative.    Physical Exam Triage Vital Signs ED Triage Vitals  Enc Vitals Group     BP      Pulse      Resp      Temp      Temp src      SpO2      Weight      Height      Head Circumference      Peak Flow      Pain Score      Pain Loc      Pain Edu?      Excl. in [place]?    No data found.  Updated Vital Signs BP (!) 145/96   Pulse (!) 102   Temp 98.7 F (37.1 C) (Oral)   Resp 18   Ht 5' 4.5" (1.638 m)   Wt 178 lb (80.7 kg)   SpO2 97%   BMI 30.08 kg/m   Visual Acuity Right Eye Distance:   Left Eye Distance:   Bilateral Distance:    Right Eye Near:   Left Eye Near:    Bilateral Near:     Physical Exam Vitals and nursing note reviewed.  Constitutional:      General: She is not in acute distress.    Appearance: She is well-developed.  HENT:     Head: Normocephalic and atraumatic.     Right Ear: Tympanic membrane normal.     Left  Ear: Tympanic membrane normal.     Nose: Rhinorrhea present.     Mouth/Throat:     Mouth: Mucous membranes are moist.     Pharynx: Oropharynx is clear.  Eyes:     Conjunctiva/sclera: Conjunctivae normal.  Cardiovascular:     Rate and Rhythm: Normal rate and regular rhythm.     Heart sounds: Normal heart sounds.  Pulmonary:     Effort: Pulmonary effort is normal. No respiratory distress.     Breath sounds: Normal breath sounds.  Abdominal:     Palpations: Abdomen is soft.     Tenderness: There is no abdominal tenderness.  Musculoskeletal:     Cervical back: Neck supple.  Skin:    General: Skin is warm and dry.  Neurological:     General: No focal deficit present.     Mental Status: She is alert and oriented to person, place, and time.     Gait: Gait normal.  Psychiatric:        Mood and Affect: Mood normal.        Behavior: Behavior normal.      UC Treatments / Results  Labs (all labs ordered are listed, but only abnormal results are displayed) Labs Reviewed  NOVEL CORONAVIRUS, NAA    EKG   Radiology No results found.  Procedures Procedures (including critical care time)  Medications Ordered in UC Medications - No data to display  Initial Impression / Assessment and Plan / UC Course  I have reviewed the triage vital signs and the nursing notes.  Pertinent labs & imaging results that were available during my care of the patient were reviewed by me and considered in my medical decision making (see chart for details).   Viral  URI.  Elevated blood pressure reading with known hypertension.  PCR COVID pending.  Instructed patient to self quarantine per CDC guidelines.  Discussed symptomatic treatment including Tylenol, Mucinex, rest, hydration.  Cautioned patient to only take over-the-counter symptom medications that are meant for people with hypertension which as Coricidin HBP.  Instructed patient to follow up with PCP if her symptoms are not improving.  Patient  agrees to plan of care.    Final Clinical Impressions(s) / UC Diagnoses   Final diagnoses:  Encounter for screening for COVID-19  Viral URI  Elevated blood pressure reading in office with diagnosis of hypertension     Discharge Instructions     Your COVID test is pending.  You should self quarantine per CDC guidelines.  See attached.    Follow up with your primary care provider if your symptoms are not improving.    Your blood pressure is elevated today at 145/96.  Please have this rechecked by your primary care provider in 2-4 weeks.          ED Prescriptions    None     PDMP not reviewed this encounter.   Sharion Balloon, NP 10/17/20 [phone]

## 2020-10-17 NOTE — ED Triage Notes (Signed)
Pt reports having a cough and nasal congestion since Tuesday. sts she had the  2nd covid booster on Friday. +home covid test this morning.

## 2020-10-17 NOTE — Discharge Instructions (Signed)
Your COVID test is pending.  You should self quarantine per CDC guidelines.  See attached.    Follow up with your primary care provider if your symptoms are not improving.    Your blood pressure is elevated today at 145/96.  Please have this rechecked by your primary care provider in 2-4 weeks.

## 2020-10-18 LAB — NOVEL CORONAVIRUS, NAA: SARS-CoV-2, NAA: DETECTED — AB

## 2020-10-18 LAB — SARS-COV-2, NAA 2 DAY TAT

## 2021-02-07 ENCOUNTER — Other Ambulatory Visit: Payer: Self-pay | Admitting: Obstetrics and Gynecology

## 2021-02-07 DIAGNOSIS — Z1231 Encounter for screening mammogram for malignant neoplasm of breast: Secondary | ICD-10-CM

## 2021-05-07 ENCOUNTER — Other Ambulatory Visit: Payer: Self-pay

## 2021-05-07 ENCOUNTER — Ambulatory Visit
Admission: RE | Admit: 2021-05-07 | Discharge: 2021-05-07 | Disposition: A | Discharge status: Home / Self Care | Payer: 59 | Source: Ambulatory Visit | Attending: Obstetrics and Gynecology | Admitting: Obstetrics and Gynecology

## 2021-05-07 DIAGNOSIS — Z1231 Encounter for screening mammogram for malignant neoplasm of breast: Secondary | ICD-10-CM | POA: Insufficient documentation

## 2021-05-15 ENCOUNTER — Encounter: Payer: Self-pay | Admitting: Internal Medicine

## 2021-05-20 DIAGNOSIS — S76112A Strain of left quadriceps muscle, fascia and tendon, initial encounter: Secondary | ICD-10-CM | POA: Diagnosis not present

## 2021-05-20 DIAGNOSIS — M79652 Pain in left thigh: Secondary | ICD-10-CM | POA: Diagnosis not present

## 2021-05-27 DIAGNOSIS — M25471 Effusion, right ankle: Secondary | ICD-10-CM | POA: Diagnosis not present

## 2021-05-27 DIAGNOSIS — M13871 Other specified arthritis, right ankle and foot: Secondary | ICD-10-CM | POA: Diagnosis not present

## 2021-05-27 DIAGNOSIS — S76112A Strain of left quadriceps muscle, fascia and tendon, initial encounter: Secondary | ICD-10-CM | POA: Diagnosis not present

## 2021-05-27 DIAGNOSIS — M79652 Pain in left thigh: Secondary | ICD-10-CM | POA: Diagnosis not present

## 2021-06-04 DIAGNOSIS — D519 Vitamin B12 deficiency anemia, unspecified: Secondary | ICD-10-CM | POA: Diagnosis not present

## 2021-06-16 DIAGNOSIS — M13871 Other specified arthritis, right ankle and foot: Secondary | ICD-10-CM | POA: Diagnosis not present

## 2021-06-16 DIAGNOSIS — S76112A Strain of left quadriceps muscle, fascia and tendon, initial encounter: Secondary | ICD-10-CM | POA: Diagnosis not present

## 2021-06-23 DIAGNOSIS — M25552 Pain in left hip: Secondary | ICD-10-CM | POA: Diagnosis not present

## 2021-06-30 DIAGNOSIS — M25552 Pain in left hip: Secondary | ICD-10-CM | POA: Diagnosis not present

## 2021-07-02 DIAGNOSIS — M25552 Pain in left hip: Secondary | ICD-10-CM | POA: Diagnosis not present

## 2021-07-07 DIAGNOSIS — M25552 Pain in left hip: Secondary | ICD-10-CM | POA: Diagnosis not present

## 2021-07-07 DIAGNOSIS — M76892 Other specified enthesopathies of left lower limb, excluding foot: Secondary | ICD-10-CM | POA: Diagnosis not present

## 2021-07-08 DIAGNOSIS — M25552 Pain in left hip: Secondary | ICD-10-CM | POA: Diagnosis not present

## 2021-07-10 DIAGNOSIS — M25552 Pain in left hip: Secondary | ICD-10-CM | POA: Diagnosis not present

## 2021-07-15 DIAGNOSIS — M25552 Pain in left hip: Secondary | ICD-10-CM | POA: Diagnosis not present

## 2021-07-17 DIAGNOSIS — M25552 Pain in left hip: Secondary | ICD-10-CM | POA: Diagnosis not present

## 2021-07-29 DIAGNOSIS — D510 Vitamin B12 deficiency anemia due to intrinsic factor deficiency: Secondary | ICD-10-CM | POA: Diagnosis not present

## 2021-08-05 DIAGNOSIS — M79652 Pain in left thigh: Secondary | ICD-10-CM | POA: Diagnosis not present

## 2021-08-08 DIAGNOSIS — M1612 Unilateral primary osteoarthritis, left hip: Secondary | ICD-10-CM | POA: Diagnosis not present

## 2021-08-12 ENCOUNTER — Other Ambulatory Visit: Payer: Self-pay | Admitting: Orthopedic Surgery

## 2021-08-12 ENCOUNTER — Encounter: Payer: Self-pay | Admitting: Orthopedic Surgery

## 2021-08-12 DIAGNOSIS — Z01818 Encounter for other preprocedural examination: Secondary | ICD-10-CM

## 2021-08-12 NOTE — H&P (Signed)
NAME: Brenda [name] ?MRN:   [id number] ?DOB:   1960-12-30 ? ?   HISTORY AND PHYSICAL ? ?CHIEF COMPLAINT:  left hip pain ? ?HISTORY:   [name] a 61 y.o. female  with left  Hip Pain ?Patient complains of left hip pain. Onset of the symptoms was several years ago. Inciting event: known DJD. The patient reports the hip pain is worse with weight bearing. Associated symptoms: none. Aggravating symptoms include: any weight bearing. Patient has had no prior hip problems. Previous visits for this problem: multiple, this is a longstanding diagnosis. Last seen several weeks ago by me. Evaluation to date: plain films, which were abnormal  osteoarthritis .  Plan for left total hip replacement ? ?PAST MEDICAL HISTORY:   ?Past Medical History:  ?Diagnosis Date  ? Anemia   ? Arthritis   ? Asthma   ? Atypical pneumonia   ? a. 12/2013  ? B12 deficiency 10/07/2014  ? Clostridium difficile colitis   ? a. 12/2013.was sick with pneumonia and sepsis  ? Hypertension   ? Lower extremity edema   ? ? ?PAST SURGICAL HISTORY:   ?Past Surgical History:  ?Procedure Laterality Date  ? COLONOSCOPY WITH PROPOFOL N/A 08/30/2017  ? Procedure: COLONOSCOPY WITH PROPOFOL;  Surgeon: Lin Landsman, MD;  Location: [facility] ENDOSCOPY;  Service: Gastroenterology;  Laterality: N/A;  ? COLONOSCOPY WITH PROPOFOL N/A 08/31/2017  ? Procedure: COLONOSCOPY WITH PROPOFOL;  Surgeon: Lin Landsman, MD;  Location: [facility] ENDOSCOPY;  Service: Gastroenterology;  Laterality: N/A;  ? COLONOSCOPY WITH PROPOFOL N/A 08/26/2020  ? Procedure: COLONOSCOPY WITH PROPOFOL;  Surgeon: Lin Landsman, MD;  Location: [facility] ENDOSCOPY;  Service: Gastroenterology;  Laterality: N/A;  ? JOINT REPLACEMENT Right   ? KNEE SURGERY Left 2014  ? arthroscopy. had to replace tendon with cadaver tissue  ? NECK SURGERY  2014  ? metal plate in neck  ? SHOULDER SURGERY Left 2012  ? arthroscopy  ? THROAT SURGERY  2013  ? removed nodules.  benign  ? TONSILLECTOMY    ? TOTAL HIP ARTHROPLASTY Right  02/15/2019  ? Procedure: TOTAL HIP ARTHROPLASTY ANTERIOR APPROACH;  Surgeon: Lovell Sheehan, MD;  Location: [facility] ORS;  Service: Orthopedics;  Laterality: Right;  ? ? ?MEDICATIONS:  (Not in a hospital admission) ? ? ?ALLERGIES:   ?Allergies  ?Allergen Reactions  ? Spiriva [Tiotropium Bromide Monohydrate] Other (See Comments)  ?  CLOSES THROAT  ? Shellfish Allergy Diarrhea and Nausea And Vomiting  ? Penicillins   ?  DOES NOT KNOW WHAT TYPE OF REACTION ?  Patient was 61 years old when she had this  ? Codeine Nausea And Vomiting  ? ? ?REVIEW OF SYSTEMS:   Negative except HPI ? ?FAMILY HISTORY:   ?Family History  ?Problem Relation Age of Onset  ? Multiple myeloma Mother   ? Breast cancer Maternal Aunt   ? Lung cancer Maternal Grandmother   ? Parkinson's disease Father   ? ? ?SOCIAL HISTORY:   reports that she has been smoking cigarettes. She has a 4.00 pack-year smoking history. She has never used smokeless tobacco. She reports current alcohol use. She reports that she does not use drugs. ? ?PHYSICAL EXAM:  General appearance: alert, cooperative, and no distress ?Neck: no JVD and supple, symmetrical, trachea midline ?Resp: clear to auscultation bilaterally ?Cardio: regular rate and rhythm, S1, S2 normal, no murmur, click, rub or gallop ?GI: soft, non-tender; bowel sounds normal; no masses,  no organomegaly ?Extremities: extremities normal, atraumatic, no cyanosis or  edema and Homans sign is negative, no sign of DVT ?Pulses: 2+ and symmetric ?Skin: Skin color, texture, turgor normal. No rashes or lesions  ? ? ?LABORATORY STUDIES: ?No results for input(s): WBC, HGB, HCT, PLT in the last 72 hours. ? No results for input(s): NA, K, CL, CO2, GLUCOSE, BUN, CREATININE, CALCIUM in the last 72 hours. ? ?STUDIES/RESULTS: ? No results found. ? ?ASSESSMENT:  End stage osteoarthritis left hip ?       Active Problems: ?  * No active hospital problems. * ? ?  ?PLAN:  Left Primary Total Hip ? ? ?Carlynn Spry ?08/12/2021. 5:04 PM ? ? ? ?   ? ?

## 2021-08-13 ENCOUNTER — Encounter: Payer: Self-pay | Admitting: Internal Medicine

## 2021-08-15 ENCOUNTER — Encounter
Admission: RE | Admit: 2021-08-15 | Discharge: 2021-08-15 | Disposition: A | Discharge status: Home / Self Care | Payer: 59 | Source: Ambulatory Visit | Attending: Orthopedic Surgery | Admitting: Orthopedic Surgery

## 2021-08-15 DIAGNOSIS — Z01812 Encounter for preprocedural laboratory examination: Secondary | ICD-10-CM

## 2021-08-15 DIAGNOSIS — Z01818 Encounter for other preprocedural examination: Secondary | ICD-10-CM | POA: Diagnosis not present

## 2021-08-15 DIAGNOSIS — I10 Essential (primary) hypertension: Secondary | ICD-10-CM | POA: Insufficient documentation

## 2021-08-15 HISTORY — DX: Bilateral primary osteoarthritis of hip: M16.0

## 2021-08-15 LAB — URINALYSIS, ROUTINE W REFLEX MICROSCOPIC
Bilirubin Urine: NEGATIVE
Glucose, UA: NEGATIVE mg/dL
Hgb urine dipstick: NEGATIVE
Ketones, ur: NEGATIVE mg/dL
Leukocytes,Ua: NEGATIVE
Nitrite: NEGATIVE
Protein, ur: NEGATIVE mg/dL
Specific Gravity, Urine: 1.005 (ref 1.005–1.030)
pH: 7 (ref 5.0–8.0)

## 2021-08-15 LAB — BASIC METABOLIC PANEL
Anion gap: 12 (ref 5–15)
BUN: 15 mg/dL (ref 6–20)
CO2: 28 mmol/L (ref 22–32)
Calcium: 9.4 mg/dL (ref 8.9–10.3)
Chloride: 97 mmol/L — ABNORMAL LOW (ref 98–111)
Creatinine, Ser: 0.66 mg/dL (ref 0.44–1.00)
GFR, Estimated: >60 mL/min (ref >60)
Glucose, Bld: 92 mg/dL (ref 70–99)
Potassium: 4.1 mmol/L (ref 3.5–5.1)
Sodium: 137 mmol/L (ref 135–145)

## 2021-08-15 LAB — CBC
HCT: 39 % (ref 36.0–46.0)
Hemoglobin: 12.8 g/dL (ref 12.0–15.0)
MCH: 31.4 pg (ref 26.0–34.0)
MCHC: 32.8 g/dL (ref 30.0–36.0)
MCV: 95.8 fL (ref 80.0–100.0)
Platelets: 289 10*3/uL (ref 150–400)
RBC: 4.07 MIL/uL (ref 3.87–5.11)
RDW: 12.8 % (ref 11.5–15.5)
WBC: 7.1 10*3/uL (ref 4.0–10.5)
nRBC: 0 % (ref 0.0–0.2)

## 2021-08-15 LAB — TYPE AND SCREEN
ABO/RH(D): O POS
Antibody Screen: NEGATIVE

## 2021-08-15 LAB — SURGICAL PCR SCREEN
MRSA, PCR: NEGATIVE
Staphylococcus aureus: NEGATIVE

## 2021-08-15 NOTE — Patient Instructions (Addendum)
Your procedure is scheduled on: Wednesday, April 12 ?Report to the Registration Desk on the 1st floor of the [facility]. ?To find out your arrival time, please call [phone] between 1PM - 3PM on: Tuesday, April 11 ? ?REMEMBER: ?Instructions that are not followed completely may result in serious medical risk, up to and including death; or upon the discretion of your surgeon and anesthesiologist your surgery may need to be rescheduled. ? ?Do not eat or drink after midnight the night before surgery.  ?No gum chewing, lozengers or hard candies. ? ?TAKE THESE MEDICATIONS THE MORNING OF SURGERY WITH A SIP OF WATER: ? ?Albuterol inhaler ?Hydrocodone if needed for pain ?Metoprolol ? ?Use inhalers on the day of surgery and bring to the hospital. ? ?One week prior to surgery: starting April 5 ?Stop ASPIRIN and Anti-inflammatories (NSAIDS) such as Advil, Aleve, Ibuprofen, Motrin, Naproxen, Naprosyn and Aspirin based products such as Excedrin, Goodys Powder, BC Powder. ?Stop ANY OVER THE COUNTER supplements until after surgery. Stop Coenzyme Q10, fish oil, probiotic ?You may however, continue to take Tylenol if needed for pain up until the day of surgery. ? ?No Alcohol for 24 hours before or after surgery. ? ?No Smoking including e-cigarettes for 24 hours prior to surgery.  ?No chewable tobacco products for at least 6 hours prior to surgery.  ?No nicotine patches on the day of surgery. ? ?Do not use any "recreational" drugs for at least a week prior to your surgery.  ?Please be advised that the combination of cocaine and anesthesia may have negative outcomes, up to and including death. ?If you test positive for cocaine, your surgery will be cancelled. ? ?On the morning of surgery brush your teeth with toothpaste and water, you may rinse your mouth with mouthwash if you wish. ?Do not swallow any toothpaste or mouthwash. ? ?Use CHG Soap as directed on instruction sheet. ? ?Do not wear jewelry, make-up, hairpins, clips or  nail polish. ? ?Do not wear lotions, powders, or perfumes.  ? ?Do not shave body from the neck down 48 hours prior to surgery just in case you cut yourself which could leave a site for infection.  ?Also, freshly shaved skin may become irritated if using the CHG soap. ? ?Do not bring valuables to the hospital. [facility] is not responsible for any missing/lost belongings or valuables.  ? ?Notify your doctor if there is any change in your medical condition (cold, fever, infection). ? ?Wear comfortable clothing (specific to your surgery type) to the hospital. ? ?After surgery, you can help prevent lung complications by doing breathing exercises.  ?Take deep breaths and cough every 1-2 hours. Your doctor may order a device called an Incentive Spirometer to help you take deep breaths. ? ?If you are being admitted to the hospital overnight, leave your suitcase in the car. ?After surgery it may be brought to your room. ? ?If you are being discharged the day of surgery, you will not be allowed to drive home. ?You will need a responsible adult (18 years or older) to drive you home and stay with you that night.  ? ?If you are taking public transportation, you will need to have a responsible adult (18 years or older) with you. ?Please confirm with your physician that it is acceptable to use public transportation.  ? ?Please call the [facility] Dept. at [phone] if you have any questions about these instructions. ? ?Surgery Visitation Policy: ? ?Patients undergoing a surgery or procedure may have  two family members or support persons with them as long as the person is not COVID-19 positive or experiencing its symptoms.  ? ?Inpatient Visitation:   ? ?Visiting hours are 7 a.m. to 8 p.m. ?Up to four visitors are allowed at one time in a patient room, including children. The visitors may rotate out with other people during the day. One designated support person (adult) may remain overnight.  ?

## 2021-08-26 MED ORDER — LACTATED RINGERS IV SOLN: INTRAVENOUS | Status: DC

## 2021-08-26 MED ORDER — ORAL CARE MOUTH RINSE: 15.0000 mL | Freq: Once | OROMUCOSAL | Status: AC

## 2021-08-26 MED ORDER — FAMOTIDINE 20 MG PO TABS: 20.0000 mg | ORAL_TABLET | Freq: Once | ORAL | Status: AC

## 2021-08-26 MED ORDER — POVIDONE-IODINE 10 % EX SWAB: 2.0000 "application " | Freq: Once | CUTANEOUS | Status: DC

## 2021-08-26 MED ORDER — CHLORHEXIDINE GLUCONATE 0.12 % MT SOLN
15.0000 mL | Freq: Once | OROMUCOSAL | Status: AC
Start: 2021-08-26 — End: 2021-08-27
  Administered 2021-08-27: 15 mL via OROMUCOSAL

## 2021-08-26 MED ORDER — CLINDAMYCIN PHOSPHATE 900 MG/50ML IV SOLN
900.0000 mg | INTRAVENOUS | Status: AC
  Administered 2021-08-27: 900 mg via INTRAVENOUS

## 2021-08-27 ENCOUNTER — Observation Stay
Admission: RE | Admit: 2021-08-27 | Discharge: 2021-08-28 | Disposition: A | Discharge status: Home / Self Care | Payer: 59 | Attending: Orthopedic Surgery | Admitting: Orthopedic Surgery

## 2021-08-27 ENCOUNTER — Encounter: Payer: Self-pay | Admitting: Orthopedic Surgery

## 2021-08-27 ENCOUNTER — Ambulatory Visit: Payer: 59

## 2021-08-27 ENCOUNTER — Encounter
Admission: RE | Disposition: A | Discharge status: Home / Self Care | Payer: Self-pay | Source: Home / Self Care | Attending: Orthopedic Surgery

## 2021-08-27 ENCOUNTER — Other Ambulatory Visit: Payer: Self-pay

## 2021-08-27 ENCOUNTER — Ambulatory Visit: Payer: 59 | Admitting: Certified Registered"

## 2021-08-27 ENCOUNTER — Ambulatory Visit: Admit: 2021-08-27 | Payer: Self-pay | Admitting: Orthopedic Surgery

## 2021-08-27 DIAGNOSIS — Z01812 Encounter for preprocedural laboratory examination: Secondary | ICD-10-CM

## 2021-08-27 DIAGNOSIS — M87052 Idiopathic aseptic necrosis of left femur: Secondary | ICD-10-CM | POA: Diagnosis not present

## 2021-08-27 DIAGNOSIS — M1612 Unilateral primary osteoarthritis, left hip: Secondary | ICD-10-CM | POA: Diagnosis not present

## 2021-08-27 DIAGNOSIS — J45909 Unspecified asthma, uncomplicated: Secondary | ICD-10-CM | POA: Diagnosis not present

## 2021-08-27 DIAGNOSIS — I10 Essential (primary) hypertension: Secondary | ICD-10-CM | POA: Insufficient documentation

## 2021-08-27 DIAGNOSIS — Z471 Aftercare following joint replacement surgery: Secondary | ICD-10-CM | POA: Diagnosis not present

## 2021-08-27 DIAGNOSIS — Z96642 Presence of left artificial hip joint: Secondary | ICD-10-CM | POA: Diagnosis not present

## 2021-08-27 HISTORY — PX: TOTAL HIP ARTHROPLASTY: SHX124

## 2021-08-27 SURGERY — ARTHROPLASTY, HIP, TOTAL, ANTERIOR APPROACH
Anesthesia: General | Site: Hip | Laterality: Left

## 2021-08-27 SURGERY — ARTHROPLASTY, HIP, TOTAL, ANTERIOR APPROACH
Anesthesia: Spinal | Site: Hip | Laterality: Left

## 2021-08-27 MED ORDER — METHOCARBAMOL 500 MG PO TABS
ORAL_TABLET | ORAL | Status: AC
  Filled 2021-08-27: qty 1

## 2021-08-27 MED ORDER — MENTHOL 3 MG MT LOZG: 1.0000 | LOZENGE | OROMUCOSAL | Status: DC | PRN

## 2021-08-27 MED ORDER — TRANEXAMIC ACID-NACL 1000-0.7 MG/100ML-% IV SOLN
INTRAVENOUS | Status: AC
  Filled 2021-08-27: qty 100

## 2021-08-27 MED ORDER — METOCLOPRAMIDE HCL 10 MG PO TABS: 5.0000 mg | ORAL_TABLET | Freq: Three times a day (TID) | ORAL | Status: DC | PRN

## 2021-08-27 MED ORDER — PHENYLEPHRINE 40 MCG/ML (10ML) SYRINGE FOR IV PUSH (FOR BLOOD PRESSURE SUPPORT)
PREFILLED_SYRINGE | INTRAVENOUS | Status: AC
  Filled 2021-08-27: qty 10

## 2021-08-27 MED ORDER — OXYCODONE HCL 5 MG PO TABS
ORAL_TABLET | ORAL | Status: AC
  Filled 2021-08-27: qty 2

## 2021-08-27 MED ORDER — MIDAZOLAM HCL 2 MG/2ML IJ SOLN
INTRAMUSCULAR | Status: AC
Start: 2021-08-27 — End: ?
  Filled 2021-08-27: qty 2

## 2021-08-27 MED ORDER — METOCLOPRAMIDE HCL 5 MG/ML IJ SOLN: 5.0000 mg | Freq: Three times a day (TID) | INTRAMUSCULAR | Status: DC | PRN

## 2021-08-27 MED ORDER — METOPROLOL SUCCINATE ER 25 MG PO TB24
100.0000 mg | ORAL_TABLET | Freq: Every day | ORAL | Status: DC
  Administered 2021-08-28: 100 mg via ORAL
  Filled 2021-08-27: qty 4

## 2021-08-27 MED ORDER — FENTANYL CITRATE (PF) 100 MCG/2ML IJ SOLN
INTRAMUSCULAR | Status: DC | PRN
Start: 2021-08-27 — End: 2021-08-27
  Administered 2021-08-27 (×2): 50 ug via INTRAVENOUS

## 2021-08-27 MED ORDER — TRANEXAMIC ACID-NACL 1000-0.7 MG/100ML-% IV SOLN
1000.0000 mg | INTRAVENOUS | Status: AC
  Administered 2021-08-27: 1000 mg via INTRAVENOUS

## 2021-08-27 MED ORDER — LORATADINE 10 MG PO TABS
10.0000 mg | ORAL_TABLET | Freq: Every day | ORAL | Status: DC
Start: 2021-08-27 — End: 2021-08-28
  Administered 2021-08-27: 10 mg via ORAL

## 2021-08-27 MED ORDER — FENTANYL CITRATE (PF) 100 MCG/2ML IJ SOLN: 25.0000 ug | INTRAMUSCULAR | Status: DC | PRN

## 2021-08-27 MED ORDER — PHENYLEPHRINE HCL (PRESSORS) 10 MG/ML IV SOLN
INTRAVENOUS | Status: AC
  Filled 2021-08-27: qty 1

## 2021-08-27 MED ORDER — DOCUSATE SODIUM 100 MG PO CAPS
ORAL_CAPSULE | ORAL | Status: AC
  Filled 2021-08-27: qty 1

## 2021-08-27 MED ORDER — LORATADINE 10 MG PO TABS
ORAL_TABLET | ORAL | Status: AC
  Filled 2021-08-27: qty 1

## 2021-08-27 MED ORDER — ACETAMINOPHEN 10 MG/ML IV SOLN
INTRAVENOUS | Status: AC
  Filled 2021-08-27: qty 100

## 2021-08-27 MED ORDER — ACETAMINOPHEN 325 MG PO TABS
325.0000 mg | ORAL_TABLET | Freq: Four times a day (QID) | ORAL | Status: DC | PRN
  Administered 2021-08-27 (×2): 650 mg via ORAL

## 2021-08-27 MED ORDER — METHOCARBAMOL 500 MG PO TABS
500.0000 mg | ORAL_TABLET | Freq: Every day | ORAL | Status: DC
  Administered 2021-08-27: 500 mg via ORAL

## 2021-08-27 MED ORDER — FENTANYL CITRATE (PF) 100 MCG/2ML IJ SOLN
INTRAMUSCULAR | Status: AC
  Filled 2021-08-27: qty 2

## 2021-08-27 MED ORDER — ALBUTEROL SULFATE (2.5 MG/3ML) 0.083% IN NEBU: 3.0000 mL | INHALATION_SOLUTION | Freq: Four times a day (QID) | RESPIRATORY_TRACT | Status: DC | PRN

## 2021-08-27 MED ORDER — DOCUSATE SODIUM 100 MG PO CAPS
ORAL_CAPSULE | ORAL | Status: AC
  Administered 2021-08-27: 100 mg via ORAL
  Filled 2021-08-27: qty 1

## 2021-08-27 MED ORDER — ONDANSETRON HCL 4 MG/2ML IJ SOLN: 4.0000 mg | Freq: Four times a day (QID) | INTRAMUSCULAR | Status: DC | PRN

## 2021-08-27 MED ORDER — ALUM & MAG HYDROXIDE-SIMETH 200-200-20 MG/5ML PO SUSP: 30.0000 mL | ORAL | Status: DC | PRN

## 2021-08-27 MED ORDER — RISAQUAD PO CAPS
1.0000 | ORAL_CAPSULE | Freq: Every day | ORAL | Status: DC
Start: 2021-08-27 — End: 2021-08-28
  Administered 2021-08-28: 1 via ORAL
  Filled 2021-08-27 (×2): qty 1

## 2021-08-27 MED ORDER — SODIUM CHLORIDE 0.9 % IR SOLN
Status: DC | PRN
Start: 2021-08-27 — End: 2021-08-27
  Administered 2021-08-27: 1500 mL

## 2021-08-27 MED ORDER — ACETAMINOPHEN 10 MG/ML IV SOLN
INTRAVENOUS | Status: DC | PRN
Start: 2021-08-27 — End: 2021-08-27
  Administered 2021-08-27: 1000 mg via INTRAVENOUS

## 2021-08-27 MED ORDER — ONDANSETRON HCL 4 MG PO TABS: 4.0000 mg | ORAL_TABLET | Freq: Four times a day (QID) | ORAL | Status: DC | PRN

## 2021-08-27 MED ORDER — PROPOFOL 500 MG/50ML IV EMUL
INTRAVENOUS | Status: DC | PRN
  Administered 2021-08-27: 80 ug/kg/min via INTRAVENOUS

## 2021-08-27 MED ORDER — PHENYLEPHRINE 40 MCG/ML (10ML) SYRINGE FOR IV PUSH (FOR BLOOD PRESSURE SUPPORT)
PREFILLED_SYRINGE | INTRAVENOUS | Status: DC | PRN
Start: 2021-08-27 — End: 2021-08-27
  Administered 2021-08-27: 160 ug via INTRAVENOUS

## 2021-08-27 MED ORDER — OXYCODONE HCL 5 MG PO TABS
ORAL_TABLET | ORAL | Status: AC
  Administered 2021-08-27: 10 mg via ORAL
  Filled 2021-08-27: qty 2

## 2021-08-27 MED ORDER — KETOROLAC TROMETHAMINE 15 MG/ML IJ SOLN
INTRAMUSCULAR | Status: AC
  Administered 2021-08-27: 7.5 mg
  Filled 2021-08-27: qty 1

## 2021-08-27 MED ORDER — KETOROLAC TROMETHAMINE 15 MG/ML IJ SOLN
INTRAMUSCULAR | Status: AC
  Administered 2021-08-27: 7.5 mg via INTRAVENOUS
  Filled 2021-08-27: qty 1

## 2021-08-27 MED ORDER — METOPROLOL SUCCINATE ER 25 MG PO TB24
100.0000 mg | ORAL_TABLET | Freq: Every day | ORAL | Status: DC
  Filled 2021-08-27: qty 4

## 2021-08-27 MED ORDER — OXYCODONE HCL 5 MG PO TABS
10.0000 mg | ORAL_TABLET | ORAL | Status: DC | PRN
  Administered 2021-08-27 – 2021-08-28 (×3): 10 mg via ORAL

## 2021-08-27 MED ORDER — MAGNESIUM HYDROXIDE 400 MG/5ML PO SUSP: 30.0000 mL | Freq: Every day | ORAL | Status: DC | PRN

## 2021-08-27 MED ORDER — BUPIVACAINE-EPINEPHRINE (PF) 0.25% -1:200000 IJ SOLN
INTRAMUSCULAR | Status: AC
Start: 2021-08-27 — End: ?
  Filled 2021-08-27: qty 30

## 2021-08-27 MED ORDER — BUPIVACAINE HCL (PF) 0.5 % IJ SOLN
INTRAMUSCULAR | Status: DC | PRN
  Administered 2021-08-27: 2.5 mL via INTRATHECAL

## 2021-08-27 MED ORDER — OXYCODONE HCL 5 MG PO TABS
5.0000 mg | ORAL_TABLET | ORAL | Status: DC | PRN
  Administered 2021-08-27: 10 mg via ORAL

## 2021-08-27 MED ORDER — ASPIRIN 81 MG PO CHEW
CHEWABLE_TABLET | ORAL | Status: AC
  Administered 2021-08-27: 81 mg via ORAL
  Filled 2021-08-27: qty 1

## 2021-08-27 MED ORDER — ONDANSETRON HCL 4 MG/2ML IJ SOLN: 4.0000 mg | Freq: Once | INTRAMUSCULAR | Status: DC | PRN

## 2021-08-27 MED ORDER — ACETAMINOPHEN 325 MG PO TABS
ORAL_TABLET | ORAL | Status: AC
  Filled 2021-08-27: qty 2

## 2021-08-27 MED ORDER — KETOROLAC TROMETHAMINE 15 MG/ML IJ SOLN: 7.5000 mg | Freq: Four times a day (QID) | INTRAMUSCULAR | Status: AC

## 2021-08-27 MED ORDER — BISACODYL 10 MG RE SUPP
10.0000 mg | Freq: Every day | RECTAL | Status: DC | PRN
Start: 2021-08-27 — End: 2021-08-28
  Filled 2021-08-27: qty 1

## 2021-08-27 MED ORDER — HYDROMORPHONE HCL 2 MG PO TABS: 2.0000 mg | ORAL_TABLET | ORAL | Status: DC | PRN

## 2021-08-27 MED ORDER — PHENYLEPHRINE HCL-NACL 20-0.9 MG/250ML-% IV SOLN
INTRAVENOUS | Status: DC | PRN
  Administered 2021-08-27: 25 ug/min via INTRAVENOUS

## 2021-08-27 MED ORDER — HYDROMORPHONE HCL 1 MG/ML IJ SOLN: 0.5000 mg | INTRAMUSCULAR | Status: DC | PRN

## 2021-08-27 MED ORDER — ASPIRIN 81 MG PO CHEW: 81.0000 mg | CHEWABLE_TABLET | Freq: Two times a day (BID) | ORAL | Status: DC

## 2021-08-27 MED ORDER — CLINDAMYCIN PHOSPHATE 900 MG/50ML IV SOLN
INTRAVENOUS | Status: AC
  Filled 2021-08-27: qty 50

## 2021-08-27 MED ORDER — PRONTOSAN WOUND IRRIGATION OPTIME
TOPICAL | Status: DC | PRN
Start: 2021-08-27 — End: 2021-08-27
  Administered 2021-08-27: 200 mL

## 2021-08-27 MED ORDER — LACTATED RINGERS IV SOLN: INTRAVENOUS | Status: DC

## 2021-08-27 MED ORDER — CEFAZOLIN SODIUM-DEXTROSE 2-4 GM/100ML-% IV SOLN
INTRAVENOUS | Status: AC
  Administered 2021-08-27: 2 g via INTRAVENOUS
  Filled 2021-08-27: qty 100

## 2021-08-27 MED ORDER — FUROSEMIDE 20 MG PO TABS
20.0000 mg | ORAL_TABLET | Freq: Every day | ORAL | Status: DC
  Administered 2021-08-28: 20 mg via ORAL
  Filled 2021-08-27 (×2): qty 1

## 2021-08-27 MED ORDER — FAMOTIDINE 20 MG PO TABS
ORAL_TABLET | ORAL | Status: AC
  Administered 2021-08-27: 20 mg via ORAL
  Filled 2021-08-27: qty 1

## 2021-08-27 MED ORDER — PROPOFOL 1000 MG/100ML IV EMUL
INTRAVENOUS | Status: AC
  Filled 2021-08-27: qty 100

## 2021-08-27 MED ORDER — PHENYLEPHRINE HCL (PRESSORS) 10 MG/ML IV SOLN
INTRAVENOUS | Status: DC | PRN
  Administered 2021-08-27 (×5): 80 ug via INTRAVENOUS

## 2021-08-27 MED ORDER — CHLORHEXIDINE GLUCONATE 0.12 % MT SOLN
OROMUCOSAL | Status: AC
Start: 2021-08-27 — End: 2021-08-27
  Filled 2021-08-27: qty 15

## 2021-08-27 MED ORDER — SALINE SPRAY 0.65 % NA SOLN: 1.0000 | NASAL | Status: DC | PRN

## 2021-08-27 MED ORDER — HYDROMORPHONE HCL 2 MG PO TABS: 1.0000 mg | ORAL_TABLET | ORAL | Status: DC | PRN

## 2021-08-27 MED ORDER — DOCUSATE SODIUM 100 MG PO CAPS: 100.0000 mg | ORAL_CAPSULE | Freq: Two times a day (BID) | ORAL | Status: DC

## 2021-08-27 MED ORDER — PHENOL 1.4 % MT LIQD: 1.0000 | OROMUCOSAL | Status: DC | PRN

## 2021-08-27 MED ORDER — BUPIVACAINE-EPINEPHRINE (PF) 0.25% -1:200000 IJ SOLN
INTRAMUSCULAR | Status: DC | PRN
  Administered 2021-08-27: 30 mL via PERINEURAL

## 2021-08-27 MED ORDER — CEFAZOLIN SODIUM-DEXTROSE 2-4 GM/100ML-% IV SOLN
2.0000 g | Freq: Four times a day (QID) | INTRAVENOUS | Status: AC
  Administered 2021-08-27: 2 g via INTRAVENOUS

## 2021-08-27 MED ORDER — MIDAZOLAM HCL 5 MG/5ML IJ SOLN
INTRAMUSCULAR | Status: DC | PRN
  Administered 2021-08-27: 2 mg via INTRAVENOUS

## 2021-08-27 MED ORDER — CEFAZOLIN SODIUM-DEXTROSE 2-4 GM/100ML-% IV SOLN
INTRAVENOUS | Status: AC
  Filled 2021-08-27: qty 100

## 2021-08-27 MED ORDER — LACTATED RINGERS IV SOLN
INTRAVENOUS | Status: DC
Start: 2021-08-27 — End: 2021-08-28

## 2021-08-27 MED ORDER — ACETAMINOPHEN 325 MG PO TABS: 325.0000 mg | ORAL_TABLET | Freq: Four times a day (QID) | ORAL | Status: DC | PRN

## 2021-08-27 MED ORDER — 0.9 % SODIUM CHLORIDE (POUR BTL) OPTIME
TOPICAL | Status: DC | PRN
  Administered 2021-08-27: 500 mL

## 2021-08-27 SURGICAL SUPPLY — 60 items
BLADE SAGITTAL WIDE XTHICK NO (BLADE) ×2 IMPLANT
BNDG COHESIVE 4X5 TAN ST LF (GAUZE/BANDAGES/DRESSINGS) ×4 IMPLANT
BRUSH SCRUB EZ  4% CHG (MISCELLANEOUS) ×1
BRUSH SCRUB EZ 4% CHG (MISCELLANEOUS) ×1 IMPLANT
CABLE 1.7 (Orthopedic Implant) ×1 IMPLANT
CHLORAPREP W/TINT 26 (MISCELLANEOUS) ×2 IMPLANT
COVER HOLE (Hips) ×1 IMPLANT
DRAPE 3/4 80X56 (DRAPES) ×2 IMPLANT
DRAPE C-ARM 42X72 X-RAY (DRAPES) ×2 IMPLANT
DRAPE STERI IOBAN 125X83 (DRAPES) IMPLANT
DRAPE U-SHAPE 47X51 STRL (DRAPES) ×2 IMPLANT
DRSG AQUACEL AG ADV 3.5X10 (GAUZE/BANDAGES/DRESSINGS) ×1 IMPLANT
DRSG AQUACEL AG ADV 3.5X14 (GAUZE/BANDAGES/DRESSINGS) IMPLANT
ELECT REM PT RETURN 9FT ADLT (ELECTROSURGICAL) ×2
ELECTRODE REM PT RTRN 9FT ADLT (ELECTROSURGICAL) ×1 IMPLANT
GAUZE 4X4 16PLY ~~LOC~~+RFID DBL (SPONGE) ×2 IMPLANT
GAUZE XEROFORM 1X8 LF (GAUZE/BANDAGES/DRESSINGS) ×1 IMPLANT
GLOVE SURG ENC MOIS LTX SZ8 (GLOVE) ×2 IMPLANT
GLOVE SURG ORTHO LTX SZ8.5 (GLOVE) ×2 IMPLANT
GLOVE SURG UNDER POLY LF SZ8.5 (GLOVE) ×4 IMPLANT
GOWN STRL REUS W/ TWL XL LVL3 (GOWN DISPOSABLE) ×2 IMPLANT
GOWN STRL REUS W/TWL XL LVL3 (GOWN DISPOSABLE) ×3
GUIDE PIN 3.2X343 (PIN) ×1
GUIDE PIN 3.2X343MM (PIN) ×1
HEAD OXINIUM PLUS 0 32MM (Hips) ×1 IMPLANT
HOLSTER ELECTROSUGICAL PENCIL (MISCELLANEOUS) ×2 IMPLANT
HOOD PEEL AWAY FLYTE STAYCOOL (MISCELLANEOUS) ×6 IMPLANT
IV NS 250ML (IV SOLUTION) ×1
IV NS 250ML BAXH (IV SOLUTION) IMPLANT
IV NS IRRIG 3000ML ARTHROMATIC (IV SOLUTION) ×2 IMPLANT
KIT PATIENT CARE HANA TABLE (KITS) ×2 IMPLANT
KIT TURNOVER CYSTO (KITS) ×2 IMPLANT
LINER 3H HEMI SHELL 48MM (Liner) ×1 IMPLANT
LINER ACETABULAR 32X48 (Liner) ×1 IMPLANT
MANIFOLD NEPTUNE II (INSTRUMENTS) ×2 IMPLANT
MAT ABSORB  FLUID 56X50 GRAY (MISCELLANEOUS) ×1
MAT ABSORB FLUID 56X50 GRAY (MISCELLANEOUS) ×1 IMPLANT
NDL SAFETY ECLIPSE 18X1.5 (NEEDLE) IMPLANT
NDL SPNL 20GX3.5 QUINCKE YW (NEEDLE) ×1 IMPLANT
NEEDLE HYPO 18GX1.5 SHARP (NEEDLE)
NEEDLE HYPO 22GX1.5 SAFETY (NEEDLE) ×2 IMPLANT
NEEDLE SPNL 20GX3.5 QUINCKE YW (NEEDLE) ×2 IMPLANT
PACK HIP PROSTHESIS (MISCELLANEOUS) ×2 IMPLANT
PADDING CAST BLEND 4X4 NS (MISCELLANEOUS) ×4 IMPLANT
PILLOW ABDUCTION MEDIUM (MISCELLANEOUS) ×2 IMPLANT
PIN GUIDE 3.2X343MM (PIN) IMPLANT
PULSAVAC PLUS IRRIG FAN TIP (DISPOSABLE) ×2
SCREW 6.5X25MM (Screw) ×1 IMPLANT
SOLUTION PRONTOSAN WOUND 350ML (IRRIGATION / IRRIGATOR) ×2 IMPLANT
SPONGE T-LAP 18X18 ~~LOC~~+RFID (SPONGE) ×8 IMPLANT
STAPLER SKIN PROX 35W (STAPLE) ×2 IMPLANT
STEM POLAR STD SZ4 COLLAR (Stent) ×1 IMPLANT
SUT BONE WAX W31G (SUTURE) ×2 IMPLANT
SUT DVC 2 QUILL PDO  T11 36X36 (SUTURE) ×1
SUT DVC 2 QUILL PDO T11 36X36 (SUTURE) ×1 IMPLANT
SUT VIC AB 2-0 CT1 18 (SUTURE) ×2 IMPLANT
SYR 20ML LL LF (SYRINGE) ×2 IMPLANT
TIP FAN IRRIG PULSAVAC PLUS (DISPOSABLE) ×1 IMPLANT
WAND WEREWOLF FASTSEAL 6.0 (MISCELLANEOUS) ×2 IMPLANT
WATER STERILE IRR 500ML POUR (IV SOLUTION) ×2 IMPLANT

## 2021-08-27 NOTE — Transfer of Care (Signed)
Immediate Anesthesia Transfer of Care Note ? ?Patient: Brenda [name] ? ?Procedure(s) Performed: TOTAL HIP ARTHROPLASTY ANTERIOR APPROACH (Left: Hip) ? ?Patient Location: PACU ? ?Anesthesia Type:Spinal ? ?Level of Consciousness: drowsy ? ?Airway & Oxygen Therapy: Patient Spontanous Breathing and Patient connected to face mask oxygen ? ?Post-op Assessment: Report given to RN and Post -op Vital signs reviewed and stable ? ?Post vital signs: Reviewed and stable ? ?Last Vitals:  ?Vitals Value Taken Time  ?BP 86/71 08/27/21 1004  ?Temp 36.2 ?C 08/27/21 1002  ?Pulse 59 08/27/21 1011  ?Resp 16 08/27/21 1011  ?SpO2 100 % 08/27/21 1011  ?Vitals shown include unvalidated device data. ? ?Last Pain:  ?Vitals:  ? 08/27/21 1002  ?TempSrc:   ?PainSc: 0-No pain  ?   ? ?  ? ?Complications: No notable events documented. ?

## 2021-08-27 NOTE — Progress Notes (Signed)
Patient awake/alert x4, able to move bil lower ext, wiggle toes, sensation intact. ?

## 2021-08-27 NOTE — H&P (Signed)
The patient has been re-examined, and the chart reviewed, and there have been no interval changes to the documented history and physical.  Plan a left total hip today.  Anesthesia is not consulted regarding a peripheral nerve block for post-operative pain.  The risks, benefits, and alternatives have been discussed at length, and the patient is willing to proceed.    

## 2021-08-27 NOTE — Op Note (Signed)
08/27/2021 ? ?10:00 AM ? ?PATIENT:  Brenda [name]  ? ?MRN: [id number] ? ?PRE-OPERATIVE DIAGNOSIS:  Osteonecrosis left hip  ? ?POST-OPERATIVE DIAGNOSIS: Same ? ?Procedure: Left Total Hip Replacement ? ?Surgeon: Elyn Aquas. Harlow Mares, MD  ? ?Assist: Carlynn Spry, PA-C ? ?Anesthesia: Spinal  ? ?EBL: 100 mL  ? ?Specimens: None  ? ?Drains: None  ? ?Components used: A size 4 Polarstem [employer]hew, R3 size 48 mm shell, and a 32 mm +0 mm head, 1.7 mm Synthes Cable ? ? ?Description of the procedure in detail: After informed consent was obtained and the appropriate extremity marked in the pre-operative holding area, the patient was taken to the operating room and placed in the supine position on the fracture table. All pressure points were well padded and bilateral lower extremities were place in traction spars. The hip was prepped and draped in standard sterile fashion. A spinal anesthetic had been delivered by the anesthesia team. The skin and subcutaneous tissues were injected with a mixture of Marcaine with epinephrine for post-operative pain. A longitudinal incision approximately 10 cm in length was carried out from the anterior superior iliac spine to the greater trochanter. The tensor fascia was divided and blunt dissection was taken down to the level of the joint capsule. The lateral circumflex vessels were cauterized. Deep retractors were placed and a portion of the anterior capsule was excised. Using fluoroscopy the neck cut was planned and carried out with a sagittal saw. The head was passed from the field with use of a corkscrew and hip skid. Deep retractors were placed along the acetabulum and the degenerative labrum and large osteophytes were removed with a Rongeur. The cup was sequentially reamed to a size 48 mm. The wound was irrigated and using fluoroscopy the size 48 mm cup was impacted in to anatomic position. A single screw was placed followed by a threaded hole cover. The final liner was impacted in to  position. Attention was then turned to the proximal femur. The leg was placed in extension and external rotation. The canal was opened and sequentially broached to a size 4. The trial components were placed and the hip relocated. The components were found to be in good position using fluoroscopy. The hip was dislocated and the trial components removed. The final components were impacted in to position.  and the hip relocated.  ? ?A small calcar fracture was identified and a single cable was placed with excellent fixation.  ? ?The final components were again check with fluoroscopy and found to be in good position. Hemostasis was achieved with electrocautery. The deep capsule was injected with Marcaine and epinephrine. The wound was irrigated with bacitracin laced normal saline and the tensor fascia closed with #2 Quill suture. The subcutaneous tissues were closed with 2-0 vicryl and staples for the skin. A sterile dressing was applied and an abduction pillow. Patient tolerated the procedure well and there were no apparent complication. Patient was taken to the recovery room in good condition.  ? ?Kurtis Bushman, MD ?  ?

## 2021-08-27 NOTE — Anesthesia Procedure Notes (Signed)
Spinal ? ?Patient location during procedure: OR ?Start time: 08/27/2021 7:46 AM ?Reason for block: surgical anesthesia ?Staffing ?Performed: resident/CRNA  ?Preanesthetic Checklist ?Completed: patient identified, IV checked, site marked, risks and benefits discussed, surgical consent, monitors and equipment checked, pre-op evaluation and timeout performed ?Spinal Block ?Patient position: sitting ?Prep: DuraPrep ?Patient monitoring: heart rate, cardiac monitor, continuous pulse ox and blood pressure ?Approach: midline ?Location: L3-4 ?Injection technique: single-shot ?Needle ?Needle type: Sprotte  ?Needle gauge: 24 G ?Needle length: 9 cm ?Assessment ?Sensory level: T4 ?Events: CSF return ?Additional Notes ?Attempt x 1.  Atraumatic, negative paresthesia, negative heme, no pain with injection, good free flow CSF pre/post injection. ? ? ? ?

## 2021-08-27 NOTE — Anesthesia Preprocedure Evaluation (Signed)
Anesthesia Evaluation  ?Patient identified by MRN, date of birth, ID band ?Patient awake ? ? ? ?Reviewed: ?Allergy & Precautions, H&P , NPO status , Patient's Chart, lab work & pertinent test results, reviewed documented beta blocker date and time  ? ?Airway ?Mallampati: II ? ? ?Neck ROM: full ? ? ? Dental ? ?(+) Poor Dentition ?  ?Pulmonary ?asthma , COPD, Current Smoker,  ?  ?Pulmonary exam normal ? ? ? ? ? ? ? Cardiovascular ?Exercise Tolerance: Good ?hypertension, On Medications ?+CHF  ?Normal cardiovascular exam ?Rhythm:regular Rate:Normal ? ? ?  ?Neuro/Psych ?negative neurological ROS ? negative psych ROS  ? GI/Hepatic ?negative GI ROS, Neg liver ROS,   ?Endo/Other  ?negative endocrine ROS ? Renal/GU ?negative Renal ROS  ?negative genitourinary ?  ?Musculoskeletal ? ? Abdominal ?  ?Peds ? Hematology ? ?(+) Blood dyscrasia, anemia ,   ?Anesthesia Other Findings ?Past Medical History: ?No date: Anemia ?No date: Arthritis ?No date: Asthma ?No date: Atypical pneumonia ?    Comment:  a. 12/2013 ?10/07/2014: B12 deficiency ?No date: Clostridium difficile colitis ?    Comment:  a. 12/2013.was sick with pneumonia and sepsis ?No date: Hypertension ?No date: Lower extremity edema ?No date: Osteoarthritis of both hips ?Past Surgical History: ?08/30/2017: COLONOSCOPY WITH PROPOFOL; N/A ?    Comment:  Procedure: COLONOSCOPY WITH PROPOFOL;  Surgeon: Marius Ditch,  ?             Tally Due, MD;  Location: [facility];  Service:  ?             Gastroenterology;  Laterality: N/A; ?08/31/2017: COLONOSCOPY WITH PROPOFOL; N/A ?    Comment:  Procedure: COLONOSCOPY WITH PROPOFOL;  Surgeon: Marius Ditch,  ?             Tally Due, MD;  Location: [facility];  Service:  ?             Gastroenterology;  Laterality: N/A; ?08/26/2020: COLONOSCOPY WITH PROPOFOL; N/A ?    Comment:  Procedure: COLONOSCOPY WITH PROPOFOL;  Surgeon: Marius Ditch,  ?             Tally Due, MD;  Location: [facility];  Service:  ?              Gastroenterology;  Laterality: N/A; ?2014: KNEE SURGERY; Left ?    Comment:  arthroscopy. had to replace tendon with cadaver tissue ?2014: NECK SURGERY ?    Comment:  metal plate in neck; X3-2 ?2012: SHOULDER SURGERY; Left ?    Comment:  arthroscopy ?2013: THROAT SURGERY ?    Comment:  removed nodules.  benign ?1968: TONSILLECTOMY ?02/15/2019: TOTAL HIP ARTHROPLASTY; Right ?    Comment:  Procedure: TOTAL HIP ARTHROPLASTY ANTERIOR APPROACH;   ?             Surgeon: Lovell Sheehan, MD;  Location: [facility];   ?             Service: Orthopedics;  Laterality: Right; ?BMI   ? Body Mass Index: 33.12 kg/m?  ?  ? Reproductive/Obstetrics ?negative OB ROS ? ?  ? ? ? ? ? ? ? ? ? ? ? ? ? ?  ?  ? ? ? ? ? ? ? ? ?Anesthesia Physical ?Anesthesia Plan ? ?ASA: 3 ? ?Anesthesia Plan: General  ? ?Post-op Pain Management:   ? ?Induction:  ? ?PONV Risk Score and Plan: 3 ? ?Airway Management Planned:  ? ?Additional Equipment:  ? ?Intra-op Plan:  ? ?Post-operative Plan:  ? ?  Informed Consent: I have reviewed the patients History and Physical, chart, labs and discussed the procedure including the risks, benefits and alternatives for the proposed anesthesia with the patient or authorized representative who has indicated his/her understanding and acceptance.  ? ? ? ?Dental Advisory Given ? ?Plan Discussed with: CRNA ? ?Anesthesia Plan Comments:   ? ? ? ? ? ? ?Anesthesia Quick Evaluation ? ?

## 2021-08-27 NOTE — Evaluation (Signed)
Physical Therapy Evaluation ?Patient Details ?Name: Brenda [name] ?MRN: [id number] ?DOB: 1960-06-29 ?Today's Date: 08/27/2021 ? ?History of Present Illness ? Patient is 61 year old female admitted for L THA. PMH includes: HTN, COPD, asthma, CHF  ?Clinical Impression ? Patient received in bed, she is agreeable to PT assessment. Moderate pain with movement. Patient requires min assist for bed mobility, min guard for transfers and min guard for ambulation 75 feet with RW, PWB L. Exercise packet reviewed and given to patient. She will continue to benefit from skilled PT while here to improve functional independence for return home with family.      ?   ? ?Recommendations for follow up therapy are one component of a multi-disciplinary discharge planning process, led by the attending physician.  Recommendations may be updated based on patient status, additional functional criteria and insurance authorization. ? ?Follow Up Recommendations Home health PT ? ?  ?Assistance Recommended at Discharge Intermittent Supervision/Assistance  ?Patient can return home with the following ? A little help with walking and/or transfers;A little help with bathing/dressing/bathroom;Help with stairs or ramp for entrance;Assist for transportation;Assistance with cooking/housework ? ?  ?Equipment Recommendations Rolling walker (2 wheels)  ?Recommendations for Other Services ?    ?  ?Functional Status Assessment Patient has had a recent decline in their functional status and demonstrates the ability to make significant improvements in function in a reasonable and predictable amount of time.  ? ?  ?Precautions / Restrictions Precautions ?Precautions: Fall;Anterior Hip ?Precaution Booklet Issued: Yes (comment) ?Restrictions ?Weight Bearing Restrictions: Yes ?LLE Weight Bearing: Partial weight bearing ?LLE Partial Weight Bearing Percentage or Pounds: 50  ? ?  ? ?Mobility ? Bed Mobility ?Overal bed mobility: Needs Assistance ?Bed Mobility: Supine to  Sit, Sit to Supine ?  ?  ?Supine to sit: Min assist ?Sit to supine: Min assist ?  ?General bed mobility comments: assistance to bring L LE off/on bed ?  ? ?Transfers ?Overall transfer level: Needs assistance ?Equipment used: Rolling walker (2 wheels) ?Transfers: Sit to/from Stand ?Sit to Stand: Min guard ?  ?  ?  ?  ?  ?  ?  ? ?Ambulation/Gait ?Ambulation/Gait assistance: Min guard ?Gait Distance (Feet): 75 Feet ?Assistive device: Rolling walker (2 wheels) ?Gait Pattern/deviations: Step-to pattern, Decreased step length - right, Decreased step length - left, Decreased stride length ?Gait velocity: decreased ?  ?  ?General Gait Details: patient ambulating well with cues for PWB status. Antalgic ? ?Stairs ?  ?  ?  ?  ?  ? ?Wheelchair Mobility ?  ? ?Modified Rankin (Stroke Patients Only) ?  ? ?  ? ?Balance Overall balance assessment: Needs assistance ?Sitting-balance support: Feet supported ?Sitting balance-Leahy Scale: Good ?  ?  ?Standing balance support: Bilateral upper extremity supported, During functional activity, Reliant on assistive device for balance ?Standing balance-Leahy Scale: Fair ?Standing balance comment: reliant on RW ?  ?  ?  ?  ?  ?  ?  ?  ?  ?  ?  ?   ? ? ? ?Pertinent Vitals/Pain Pain Assessment ?Pain Assessment: 0-10 ?Pain Score: 5  ?Pain Location: L hip ?Pain Descriptors / Indicators: Discomfort, Sore ?Pain Intervention(s): Limited activity within patient's tolerance, Monitored during session, Premedicated before session, Repositioned  ? ? ?Home Living Family/patient expects to be discharged to:: Private residence ?Living Arrangements: Alone ?Available Help at Discharge: Family;Available 24 hours/day ?Type of Home: Apartment ?Home Access: Level entry ?  ?  ?  ?Home Layout: One level ?Home Equipment: BSC/3in1 ?  Additional Comments: Patient's sister is staying with her for at least a week  ?  ?Prior Function Prior Level of Function : Independent/Modified Independent;Driving;Working/employed ?  ?  ?   ?  ?  ?  ?Mobility Comments: independent ?ADLs Comments: independent ?  ? ? ?Hand Dominance  ?   ? ?  ?Extremity/Trunk Assessment  ? Upper Extremity Assessment ?Upper Extremity Assessment: Defer to OT evaluation ?  ? ?Lower Extremity Assessment ?Lower Extremity Assessment: LLE deficits/detail ?LLE: Unable to fully assess due to pain ?LLE Coordination: decreased gross motor ?  ? ?Cervical / Trunk Assessment ?Cervical / Trunk Assessment: Normal  ?Communication  ? Communication: No difficulties  ?Cognition Arousal/Alertness: Awake/alert ?Behavior During Therapy: [facility] for tasks assessed/performed ?Overall Cognitive Status: Within Functional Limits for tasks assessed ?  ?  ?  ?  ?  ?  ?  ?  ?  ?  ?  ?  ?  ?  ?  ?  ?  ?  ?  ? ?  ?General Comments   ? ?  ?Exercises Total Joint Exercises ?Ankle Circles/Pumps: AROM, Both, 10 reps ?Quad Sets: AROM, Left, 5 reps ?Gluteal Sets: AROM, Left, 5 reps ?Towel Squeeze: AROM, Left, 5 reps ?Short Arc Quad: AROM, Left, 5 reps ?Heel Slides: AROM, Left, 5 reps ?Hip ABduction/ADduction: AAROM, Left, 5 reps ?Straight Leg Raises: AAROM, Left, 5 reps  ? ?Assessment/Plan  ?  ?PT Assessment Patient needs continued PT services  ?PT Problem List Decreased strength;Decreased mobility;Decreased activity tolerance;Pain;Decreased knowledge of use of DME;Decreased knowledge of precautions;Decreased range of motion ? ?   ?  ?PT Treatment Interventions DME instruction;Therapeutic exercise;Gait training;Balance training;Stair training;Functional mobility training;Therapeutic activities;Patient/family education   ? ?PT Goals (Current goals can be found in the Care Plan section)  ?Acute Rehab PT Goals ?Patient Stated Goal: to return home ?PT Goal Formulation: With patient/family ?Time For Goal Achievement: 09/03/21 ?Potential to Achieve Goals: Good ? ?  ?Frequency BID ?  ? ? ?Co-evaluation   ?  ?  ?  ?  ? ? ?  ?AM-PAC PT "6 Clicks" Mobility  ?Outcome Measure Help needed turning from your back to your side  while in a flat bed without using bedrails?: A Little ?Help needed moving from lying on your back to sitting on the side of a flat bed without using bedrails?: A Little ?Help needed moving to and from a bed to a chair (including a wheelchair)?: A Little ?Help needed standing up from a chair using your arms (e.g., wheelchair or bedside chair)?: A Little ?Help needed to walk in hospital room?: A Little ?Help needed climbing 3-5 steps with a railing? : A Little ?6 Click Score: 18 ? ?  ?End of Session Equipment Utilized During Treatment: Gait belt ?Activity Tolerance: Patient limited by pain ?Patient left: in bed;with call bell/phone within reach;with family/visitor present ?Nurse Communication: Mobility status ?PT Visit Diagnosis: Other abnormalities of gait and mobility (R26.89);Muscle weakness (generalized) (M62.81);Pain;Difficulty in walking, not elsewhere classified (R26.2) ?Pain - Right/Left: Left ?Pain - part of body: Hip ?  ? ?Time: 0998-3382 ?PT Time Calculation (min) (ACUTE ONLY): 25 min ? ? ?Charges:   PT Evaluation ?$PT Eval Moderate Complexity: 1 Mod ?PT Treatments ?$Gait Training: 8-22 mins ?  ?   ? ? ?Amanda Cockayne, PT, GCS ?08/27/21,2:47 PM ? ? ?

## 2021-08-28 DIAGNOSIS — M1612 Unilateral primary osteoarthritis, left hip: Secondary | ICD-10-CM | POA: Diagnosis not present

## 2021-08-28 DIAGNOSIS — J45909 Unspecified asthma, uncomplicated: Secondary | ICD-10-CM | POA: Diagnosis not present

## 2021-08-28 DIAGNOSIS — Z96642 Presence of left artificial hip joint: Secondary | ICD-10-CM | POA: Diagnosis not present

## 2021-08-28 DIAGNOSIS — I10 Essential (primary) hypertension: Secondary | ICD-10-CM | POA: Diagnosis not present

## 2021-08-28 LAB — SURGICAL PATHOLOGY

## 2021-08-28 MED ORDER — KETOROLAC TROMETHAMINE 15 MG/ML IJ SOLN
INTRAMUSCULAR | Status: AC
  Administered 2021-08-28: 7.5 mg via INTRAVENOUS
  Filled 2021-08-28: qty 1

## 2021-08-28 MED ORDER — OXYCODONE HCL 5 MG PO TABS
ORAL_TABLET | ORAL | Status: AC
  Administered 2021-08-28: 10 mg via ORAL
  Filled 2021-08-28: qty 2

## 2021-08-28 MED ORDER — ASPIRIN 81 MG PO CHEW: 81.0000 mg | CHEWABLE_TABLET | Freq: Two times a day (BID) | ORAL | 0 refills | Status: DC

## 2021-08-28 MED ORDER — METHOCARBAMOL 500 MG PO TABS: 500.0000 mg | ORAL_TABLET | Freq: Four times a day (QID) | ORAL | 0 refills | Status: DC

## 2021-08-28 MED ORDER — OXYCODONE HCL 5 MG PO TABS
ORAL_TABLET | ORAL | Status: AC
  Filled 2021-08-28: qty 2

## 2021-08-28 MED ORDER — LORATADINE 10 MG PO TABS
ORAL_TABLET | ORAL | Status: AC
  Administered 2021-08-28: 10 mg via ORAL
  Filled 2021-08-28: qty 1

## 2021-08-28 MED ORDER — ASPIRIN 81 MG PO CHEW
CHEWABLE_TABLET | ORAL | Status: AC
  Administered 2021-08-28: 81 mg via ORAL
  Filled 2021-08-28: qty 1

## 2021-08-28 MED ORDER — DOCUSATE SODIUM 100 MG PO CAPS: 100.0000 mg | ORAL_CAPSULE | Freq: Two times a day (BID) | ORAL | 0 refills | Status: DC

## 2021-08-28 MED ORDER — DOCUSATE SODIUM 100 MG PO CAPS
ORAL_CAPSULE | ORAL | Status: AC
  Administered 2021-08-28: 100 mg via ORAL
  Filled 2021-08-28: qty 1

## 2021-08-28 MED ORDER — METHOCARBAMOL 500 MG PO TABS
ORAL_TABLET | ORAL | Status: AC
  Administered 2021-08-28: 500 mg via ORAL
  Filled 2021-08-28: qty 1

## 2021-08-28 NOTE — TOC Progression Note (Addendum)
Transition of Care (TOC) - Progression Note  ? ? ?Patient Details  ?Name: Brenda [name] ?MRN: [id number] ?Date of Birth: [birth year] ? ?Transition of Care (TOC) CM/SW Contact  ?Conception Oms, RN ?Phone Number: ?08/28/2021, 8:43 AM ? ?Clinical Narrative:    ?[name] is not in Network with the patient's insurance ? ?I sent out to other Home health agencies such as [facility], [facility], [facility], [facility], Awaiting a response ?[facility] is not in network with their insurance ? ?[facility] is Not in network with the insurance, [facility] is not in network with the insurance ?[facility] is not in network with the ins ? ?She will need a rolling walker to be delivered by [employer] to the bedside ? ?unable to find a [facility] agency will get a rw delivered ?Dr aware of the need for Outpatient PT and will get office to set up ? ?  ?  ? ?Expected Discharge Plan and Services ?  ?  ?  ?  ?  ?Expected Discharge Date: 08/28/21               ?  ?  ?  ?  ?  ?  ?  ?  ?  ?  ? ? ?Social Determinants of Health (SDOH) Interventions ?  ? ?Readmission Risk Interventions ?   ? View : No data to display.  ?  ?  ?  ? ? ?

## 2021-08-28 NOTE — Anesthesia Postprocedure Evaluation (Signed)
Anesthesia Post Note ? ?Patient: Brenda [name] ? ?Procedure(s) Performed: TOTAL HIP ARTHROPLASTY ANTERIOR APPROACH (Left: Hip) ? ?Patient location during evaluation: PACU ?Anesthesia Type: General ?Level of consciousness: awake and alert ?Pain management: pain level controlled ?Vital Signs Assessment: post-procedure vital signs reviewed and stable ?Respiratory status: spontaneous breathing, nonlabored ventilation, respiratory function stable and patient connected to nasal cannula oxygen ?Cardiovascular status: blood pressure returned to baseline and stable ?Postop Assessment: no apparent nausea or vomiting ?Anesthetic complications: no ? ? ?No notable events documented. ? ? ?Last Vitals:  ?Vitals:  ? 08/28/21 0400 08/28/21 0731  ?BP: 126/84 103/73  ?Pulse: 92 92  ?Resp: 18 18  ?Temp: 37.3 ?C 37.1 ?C  ?SpO2: 94% 95%  ?  ?Last Pain:  ?Vitals:  ? 08/28/21 0930  ?TempSrc:   ?PainSc: 5   ? ? ?  ?  ?  ?  ?  ?  ? ?Molli Barrows ? ? ? ? ?

## 2021-08-28 NOTE — Discharge Instructions (Signed)

## 2021-08-28 NOTE — Discharge Summary (Signed)
Physician Discharge Summary  ?Patient ID: ?Brenda [name] ?MRN: [id number] ?DOB/AGE: 1960-10-05 61 y.o. ? ?Admit date: 08/27/2021 ?Discharge date: 08/28/2021 ? ?Admission Diagnoses:  ?M16.12 Unilateral primary osteoarthritis, left hip ?History of total hip replacement, left ? 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  History of total hip replacement, left ? ? ?Past Medical History:  ?Diagnosis Date  ? Anemia   ? Arthritis   ? Asthma   ? Atypical pneumonia   ? a. 12/2013  ? B12 deficiency 10/07/2014  ? Clostridium difficile colitis   ? a. 12/2013.was sick with pneumonia and sepsis  ? Hypertension   ? Lower extremity edema   ? Osteoarthritis of both hips   ? ? ?Surgeries: Procedure(s): ?TOTAL HIP ARTHROPLASTY ANTERIOR APPROACH on 08/27/2021 ?  ?Consultants (if any):  ? ?Discharged Condition: Improved ? ?Hospital Course: Brenda [name] is an 61 y.o. 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   ? ?She was given perioperative antibiotics:  ?Anti-infectives (From admission, onward)  ? ? Start     Dose/Rate Route Frequency Ordered Stop  ? 08/27/21 1400  ceFAZolin (ANCEF) IVPB 2g/100 mL premix       ? 2 g ?200 mL/hr over 30 Minutes Intravenous Every 6 hours 08/27/21 1205 08/27/21 2040  ? 08/27/21 0702  clindamycin (CLEOCIN) 900 MG/50ML IVPB       ?Note to Pharmacy: Rivka Spring L: cabinet override  ?    08/27/21 0702 08/27/21 0753  ? 08/27/21 0600  clindamycin (CLEOCIN) IVPB 900 mg       ? 900 mg ?100 mL/hr over 30 Minutes Intravenous On call to O.R. 08/26/21 2328 08/27/21 0753  ? ?  ?. ? ?She was given sequential compression devices, early ambulation, and Aspirin 81 mg twice daily for 30 days for DVT prophylaxis. ? ?She benefited maximally from the hospital stay and there were no  complications.   ? ?Recent vital signs:  ?Vitals:  ? 08/28/21 0000 08/28/21 0400  ?BP: 138/86 126/84  ?Pulse: 97 92  ?Resp: 18 18  ?Temp: 99 ?F (37.2 ?C) 99.2 ?F (37.3 ?C)  ?SpO2: 94% 94%  ? ? ?Recent laboratory studies:  ?Lab Results  ?Component Value Date  ? HGB 12.8 08/15/2021  ? HGB 10.2 (L) 02/17/2019  ? HGB 10.8 (L) 02/17/2019  ? ?Lab Results  ?Component Value Date  ? WBC 7.1 08/15/2021  ? PLT 289 08/15/2021  ? ?Lab Results  ?Component Value Date  ? INR 1.0 02/07/2019  ? ?Lab Results  ?Component Value Date  ? NA 137 08/15/2021  ? K 4.1 08/15/2021  ? CL 97 (L) 08/15/2021  ? CO2 28 08/15/2021  ? BUN 15 08/15/2021  ? CREATININE 0.66 08/15/2021  ? GLUCOSE 92 08/15/2021  ? ? ?Discharge Medications:   ?Allergies as of 08/28/2021   ? ?   Reactions  ? Spiriva [tiotropium Bromide Monohydrate] Other (See Comments)  ? CLOSES THROAT  ? Shellfish Allergy Diarrhea, Nausea And Vomiting  ? Penicillins   ? DOES NOT KNOW WHAT TYPE OF REACTION ?  Patient was 61 years old when she had this  ? Codeine Nausea And Vomiting  ? ?  ? ?  ?Medication List  ?  ? ?STOP taking these medications   ? ?aspirin 81 MG EC tablet ?Replaced by: aspirin 81  MG chewable tablet ?  ? ?  ? ?TAKE these medications   ? ?acetaminophen 500 MG tablet ?Commonly known as: TYLENOL ?Take 1,000 mg by mouth every 6 (six) hours as needed for moderate pain. ?  ?albuterol 108 (90 Base) MCG/ACT inhaler ?Commonly known as: VENTOLIN HFA ?Inhale 2 puffs into the lungs every 6 (six) hours as needed for wheezing or shortness of breath. ?  ?aspirin 81 MG chewable tablet ?Chew 1 tablet (81 mg total) by mouth 2 (two) times daily. ?Replaces: aspirin 81 MG EC tablet ?  ?Biotin 10000 MCG Tabs ?Take 10,000 mcg by mouth daily. ?  ?calcium carbonate 1500 (600 Ca) MG Tabs tablet ?Commonly known as: OSCAL ?Take 600 mg of elemental calcium by mouth daily with breakfast. ?  ?Coenzyme Q10 100 MG Tabs ?Take 100 mg by mouth daily. ?  ?cyanocobalamin 1000 MCG/ML injection ?Commonly known as:  (VITAMIN B-12) ?Inject 1,000 mcg into the muscle as needed. Every other month (March, May, ...Marland KitchenMarland KitchenMarland Kitchen) ?  ?diclofenac sodium 1 % Gel ?Commonly known as: VOLTAREN ?Apply 2 g topically 3 (three) times daily as needed (pain). ?  ?docusate sodium 100 MG capsule ?Commonly known as: COLACE ?Take 1 capsule (100 mg total) by mouth 2 (two) times daily. ?  ?fexofenadine 180 MG tablet ?Commonly known as: ALLEGRA ?Take 180 mg by mouth daily. ?  ?Fish Oil 1000 MG Caps ?Take 1,000 mg by mouth daily. ?  ?furosemide 20 MG tablet ?Commonly known as: LASIX ?Take 1 tablet (20 mg total) by mouth daily. ?  ?HYDROcodone-acetaminophen 5-325 MG tablet ?Commonly known as: NORCO/VICODIN ?Take 1-2 tablets by mouth every 4 (four) hours as needed for moderate pain (pain score 4-6). ?What changed:  ?when to take this ?reasons to take this ?  ?methocarbamol 500 MG tablet ?Commonly known as: ROBAXIN ?Take 500 mg by mouth daily. ?What changed: Another medication with the same name was added. Make sure you understand how and when to take each. ?  ?methocarbamol 500 MG tablet ?Commonly known as: Robaxin ?Take 1 tablet (500 mg total) by mouth 4 (four) times daily. ?What changed: You were already taking a medication with the same name, and this prescription was added. Make sure you understand how and when to take each. ?  ?metoprolol succinate 100 MG 24 hr tablet ?Commonly known as: TOPROL-XL ?Take 1 tablet (100 mg total) by mouth daily. Take with or immediately following a meal. ?  ?MULTIPLE VITAMIN PO ?Take 1 tablet by mouth daily. ?  ?naproxen 500 MG tablet ?Commonly known as: NAPROSYN ?Take 500 mg by mouth in the morning and at bedtime. ?  ?Potassium Gluconate 550 (90 K) MG Tabs ?Take 550 mg by mouth daily. ?  ?PROBIOTIC & ACIDOPHILUS EX ST PO ?Take 1 capsule by mouth daily. ?  ?sodium chloride 0.65 % Soln nasal spray ?Commonly known as: OCEAN ?Place 1 spray into both nostrils as needed for congestion. ?  ?Vitamin D (Ergocalciferol) 1.25 MG (50000  UNIT) Caps capsule ?Commonly known as: DRISDOL ?Take 50,000 Units by mouth every 30 (thirty) days. ?  ? ?  ? ?  ?  ? ? ?  ?Durable Medical Equipment  ?(From admission, onward)  ?  ? ? ?  ? ?  Start     Ordered  ? 08/28/21 0644  For home use only DME 3 n 1  Once       ? 08/28/21 0643  ? 08/28/21 0644  For home use only DME Walker rolling  Once       ?  Question Answer Comment  ?Walker: With 5 Inch Wheels   ?Patient needs a walker to treat with the following condition Osteoarthritis of left hip   ?  ? 08/28/21 0643  ? 08/27/21 1206  DME Walker rolling  Once       ?Question:  Patient needs a walker to treat with the following condition  Answer:  History of total hip replacement, left  ? 08/27/21 1205  ? 08/27/21 1206  DME 3 n 1  Once       ? 08/27/21 1205  ? 08/27/21 1206  DME Bedside commode  Once       ?Question:  Patient needs a bedside commode to treat with the following condition  Answer:  History of total hip replacement, left  ? 08/27/21 1205  ? ?  ?  ? ?  ? ? ?Diagnostic Studies: DG C-Arm 1-60 Min-No Report ? ?Result Date: 08/27/2021 ?Fluoroscopy was utilized by the requesting physician.  No radiographic interpretation.  ? ?DG C-Arm 1-60 Min-No Report ? ?Result Date: 08/27/2021 ?Fluoroscopy was utilized by the requesting physician.  No radiographic interpretation.  ? ?DG HIP UNILAT WITH PELVIS 1V LEFT ? ?Result Date: 08/27/2021 ?CLINICAL DATA:  61 year old female undergoing left hip arthroplasty. EXAM: DG HIP (WITH OR WITHOUT PELVIS) 1V*L* COMPARISON:  None available. FINDINGS: Partially visualized intraoperative fluoroscopic image of left total hip arthroplasty. No complicating features. IMPRESSION: Partially visualized left total hip arthroplasty. Electronically Signed   By: Ruthann Cancer M.D.   On: 08/27/2021 09:49   ? ?Disposition: Discharge disposition: 01-Home or Self Care ? ? ? ? ? ? ? ? ? ? ? ?Signed: ?Carlynn Spry ,PA-C ?08/28/2021, 6:44 AM ? ?  ?

## 2021-08-28 NOTE — Progress Notes (Signed)
Physical Therapy Treatment ?Patient Details ?Name: Brenda [name] ?MRN: [id number] ?DOB: 1960-06-16 ?Today's Date: 08/28/2021 ? ? ?History of Present Illness Patient is 61 year old female admitted for L THA. PMH includes: HTN, COPD, asthma, CHF ? ?  ?PT Comments  ? ? Patient received in bed, she is alert, awake, ready for PT. Patient reports 5/10 pain at rest. Required min assist for bringing L LE off bed. Min A for sit to stand with cues for hand placement, WB. She ambulated 125 feet with RW and min guard. Cues for sequencing, most effective use of RW. Pain up to 10/10 with gait. She will continue to benefit from skilled PT while here to improve functional independence and strength.  Patient left in recliner at end of session with all needs met.   ?  ?Recommendations for follow up therapy are one component of a multi-disciplinary discharge planning process, led by the attending physician.  Recommendations may be updated based on patient status, additional functional criteria and insurance authorization. ? ?Follow Up Recommendations ? Home health PT ?  ?  ?Assistance Recommended at Discharge Intermittent Supervision/Assistance  ?Patient can return home with the following A little help with walking and/or transfers;A little help with bathing/dressing/bathroom;Help with stairs or ramp for entrance;Assist for transportation;Assistance with cooking/housework ?  ?Equipment Recommendations ? Rolling walker (2 wheels)  ?  ?Recommendations for Other Services   ? ? ?  ?Precautions / Restrictions Precautions ?Precautions: Fall;Anterior Hip ?Precaution Booklet Issued: Yes (comment) ?Restrictions ?Weight Bearing Restrictions: Yes ?LLE Weight Bearing: Partial weight bearing ?LLE Partial Weight Bearing Percentage or Pounds: 50  ?  ? ?Mobility ? Bed Mobility ?Overal bed mobility: Needs Assistance ?Bed Mobility: Supine to Sit ?  ?  ?Supine to sit: Min guard ?  ?  ?General bed mobility comments: assistance to bring L LE off bed ?   ? ?Transfers ?Overall transfer level: Needs assistance ?Equipment used: Rolling walker (2 wheels) ?Transfers: Sit to/from Stand ?Sit to Stand: Min assist ?  ?  ?  ?  ?  ?General transfer comment: cues for hand placement, Weight bearing ?  ? ?Ambulation/Gait ?Ambulation/Gait assistance: Min guard ?Gait Distance (Feet): 125 Feet ?Assistive device: Rolling walker (2 wheels) ?Gait Pattern/deviations: Step-through pattern, Decreased step length - right, Decreased step length - left, Decreased stride length, Antalgic, Decreased weight shift to left ?Gait velocity: decr ?  ?  ?General Gait Details: patient ambulating well with cues for PWB status. Antalgic ? ? ?Stairs ?  ?  ?  ?  ?  ? ? ?Wheelchair Mobility ?  ? ?Modified Rankin (Stroke Patients Only) ?  ? ? ?  ?Balance Overall balance assessment: Modified Independent ?Sitting-balance support: Feet supported ?Sitting balance-Leahy Scale: Normal ?  ?  ?Standing balance support: Bilateral upper extremity supported, During functional activity, Reliant on assistive device for balance ?Standing balance-Leahy Scale: Good ?Standing balance comment: reliant on RW for PWB L LE, pain relief, good balance ?  ?  ?  ?  ?  ?  ?  ?  ?  ?  ?  ?  ? ?  ?Cognition Arousal/Alertness: Awake/alert ?Behavior During Therapy: [facility] for tasks assessed/performed ?Overall Cognitive Status: Within Functional Limits for tasks assessed ?  ?  ?  ?  ?  ?  ?  ?  ?  ?  ?  ?  ?  ?  ?  ?  ?  ?  ?  ? ?  ?Exercises Total Joint Exercises ?Ankle Circles/Pumps: AROM, Both, 10  reps ?Long Arc Quad: AROM, Left, 10 reps ? ?  ?General Comments   ?  ?  ? ?Pertinent Vitals/Pain Pain Assessment ?Pain Assessment: 0-10 ?Pain Score: 5  ?Pain Location: L hip, 10/10 after walking, RN gave pain meds ?Pain Descriptors / Indicators: Discomfort, Sore ?Pain Intervention(s): Monitored during session  ? ? ?Home Living   ?  ?  ?  ?  ?  ?  ?  ?  ?  ?   ?  ?Prior Function    ?  ?  ?   ? ?PT Goals (current goals can now be found in the  care plan section) Acute Rehab PT Goals ?Patient Stated Goal: to return home ?PT Goal Formulation: With patient/family ?Time For Goal Achievement: 09/03/21 ?Potential to Achieve Goals: Good ?Progress towards PT goals: Progressing toward goals ? ?  ?Frequency ? ? ? BID ? ? ? ?  ?PT Plan Current plan remains appropriate  ? ? ?Co-evaluation   ?  ?  ?  ?  ? ?  ?AM-PAC PT "6 Clicks" Mobility   ?Outcome Measure ? Help needed turning from your back to your side while in a flat bed without using bedrails?: A Little ?Help needed moving from lying on your back to sitting on the side of a flat bed without using bedrails?: A Little ?Help needed moving to and from a bed to a chair (including a wheelchair)?: A Little ?Help needed standing up from a chair using your arms (e.g., wheelchair or bedside chair)?: A Little ?Help needed to walk in hospital room?: A Little ?Help needed climbing 3-5 steps with a railing? : A Little ?6 Click Score: 18 ? ?  ?End of Session Equipment Utilized During Treatment: Gait belt ?Activity Tolerance: Patient limited by pain ?Patient left: in chair;with call bell/phone within reach;with family/visitor present ?Nurse Communication: Mobility status ?PT Visit Diagnosis: Other abnormalities of gait and mobility (R26.89);Muscle weakness (generalized) (M62.81);Pain;Difficulty in walking, not elsewhere classified (R26.2) ?Pain - Right/Left: Left ?Pain - part of body: Hip ?  ? ? ?Time: 4069-8614 ?PT Time Calculation (min) (ACUTE ONLY): 27 min ? ?Charges:  $Gait Training: 8-22 mins ?$Therapeutic Exercise: 8-22 mins          ?          ? ?Amanda Cockayne, PT, GCS ?08/28/21,9:54 AM ? ?

## 2021-08-28 NOTE — Progress Notes (Signed)
?  Subjective: ? ?Patient reports pain as mild to moderate.   ? ?Objective:  ? ?VITALS:   ?Vitals:  ? 08/27/21 1540 08/27/21 2000 08/28/21 0000 08/28/21 0400  ?BP:  (!) 139/95 138/86 126/84  ?Pulse: 82 82 97 92  ?Resp: $Remov'18 18 18 18  'SVgPGG$ ?Temp: 97.7 ?F (36.5 ?C) 98.3 ?F (36.8 ?C) 99 ?F (37.2 ?C) 99.2 ?F (37.3 ?C)  ?TempSrc: Oral Temporal Temporal Temporal  ?SpO2: 97% 98% 94% 94%  ?Weight:      ?Height:      ? ? ?PHYSICAL EXAM: ? ?Neurologically intact ?ABD soft ?Neurovascular intact ?Sensation intact distally ?Intact pulses distally ?Dorsiflexion/Plantar flexion intact ?Incision: dressing C/D/I ?No cellulitis present ?Compartment soft ? ?LABS ? ?No results found for this or any previous visit (from the past 24 hour(s)). ? ?DG C-Arm 1-60 Min-No Report ? ?Result Date: 08/27/2021 ?Fluoroscopy was utilized by the requesting physician.  No radiographic interpretation.  ? ?DG C-Arm 1-60 Min-No Report ? ?Result Date: 08/27/2021 ?Fluoroscopy was utilized by the requesting physician.  No radiographic interpretation.  ? ?DG HIP UNILAT WITH PELVIS 1V LEFT ? ?Result Date: 08/27/2021 ?CLINICAL DATA:  61 year old female undergoing left hip arthroplasty. EXAM: DG HIP (WITH OR WITHOUT PELVIS) 1V*L* COMPARISON:  None available. FINDINGS: Partially visualized intraoperative fluoroscopic image of left total hip arthroplasty. No complicating features. IMPRESSION: Partially visualized left total hip arthroplasty. Electronically Signed   By: Ruthann Cancer M.D.   On: 08/27/2021 09:49   ? ?Assessment/Plan: ?1 Day Post-Op  ? ?Principal Problem: ?  History of total hip replacement, left ? ? ?Advance diet ?Up with therapy ?Discharge home after PPT goals met ?WB 50 % on LLE ? ? ?Carlynn Spry , PA-C ?08/28/2021, 6:40 AM ? ? ? ? ?  ?

## 2021-08-28 NOTE — Progress Notes (Signed)
?   08/28/21 0700  ?Clinical Encounter Type  ?Visited With Patient  ?Visit Type Initial;Post-op  ? ?Chaplain provided post-op support. ?

## 2021-08-29 DIAGNOSIS — Z7982 Long term (current) use of aspirin: Secondary | ICD-10-CM | POA: Diagnosis not present

## 2021-08-29 DIAGNOSIS — M19071 Primary osteoarthritis, right ankle and foot: Secondary | ICD-10-CM | POA: Diagnosis not present

## 2021-08-29 DIAGNOSIS — D649 Anemia, unspecified: Secondary | ICD-10-CM | POA: Diagnosis not present

## 2021-08-29 DIAGNOSIS — Z96642 Presence of left artificial hip joint: Secondary | ICD-10-CM | POA: Diagnosis not present

## 2021-08-29 DIAGNOSIS — Z471 Aftercare following joint replacement surgery: Secondary | ICD-10-CM | POA: Diagnosis not present

## 2021-08-29 DIAGNOSIS — I10 Essential (primary) hypertension: Secondary | ICD-10-CM | POA: Diagnosis not present

## 2021-08-29 DIAGNOSIS — E538 Deficiency of other specified B group vitamins: Secondary | ICD-10-CM | POA: Diagnosis not present

## 2021-08-29 DIAGNOSIS — J45909 Unspecified asthma, uncomplicated: Secondary | ICD-10-CM | POA: Diagnosis not present

## 2021-08-29 DIAGNOSIS — M1611 Unilateral primary osteoarthritis, right hip: Secondary | ICD-10-CM | POA: Diagnosis not present

## 2021-08-29 DIAGNOSIS — Z87891 Personal history of nicotine dependence: Secondary | ICD-10-CM | POA: Diagnosis not present

## 2021-08-29 DIAGNOSIS — Z8701 Personal history of pneumonia (recurrent): Secondary | ICD-10-CM | POA: Diagnosis not present

## 2021-09-01 DIAGNOSIS — E538 Deficiency of other specified B group vitamins: Secondary | ICD-10-CM | POA: Diagnosis not present

## 2021-09-01 DIAGNOSIS — Z96642 Presence of left artificial hip joint: Secondary | ICD-10-CM | POA: Diagnosis not present

## 2021-09-01 DIAGNOSIS — M1611 Unilateral primary osteoarthritis, right hip: Secondary | ICD-10-CM | POA: Diagnosis not present

## 2021-09-01 DIAGNOSIS — J45909 Unspecified asthma, uncomplicated: Secondary | ICD-10-CM | POA: Diagnosis not present

## 2021-09-01 DIAGNOSIS — Z7982 Long term (current) use of aspirin: Secondary | ICD-10-CM | POA: Diagnosis not present

## 2021-09-01 DIAGNOSIS — Z471 Aftercare following joint replacement surgery: Secondary | ICD-10-CM | POA: Diagnosis not present

## 2021-09-01 DIAGNOSIS — M19071 Primary osteoarthritis, right ankle and foot: Secondary | ICD-10-CM | POA: Diagnosis not present

## 2021-09-01 DIAGNOSIS — I10 Essential (primary) hypertension: Secondary | ICD-10-CM | POA: Diagnosis not present

## 2021-09-01 DIAGNOSIS — Z87891 Personal history of nicotine dependence: Secondary | ICD-10-CM | POA: Diagnosis not present

## 2021-09-01 DIAGNOSIS — D649 Anemia, unspecified: Secondary | ICD-10-CM | POA: Diagnosis not present

## 2021-09-02 DIAGNOSIS — E538 Deficiency of other specified B group vitamins: Secondary | ICD-10-CM | POA: Diagnosis not present

## 2021-09-02 DIAGNOSIS — I10 Essential (primary) hypertension: Secondary | ICD-10-CM | POA: Diagnosis not present

## 2021-09-02 DIAGNOSIS — M1611 Unilateral primary osteoarthritis, right hip: Secondary | ICD-10-CM | POA: Diagnosis not present

## 2021-09-02 DIAGNOSIS — D649 Anemia, unspecified: Secondary | ICD-10-CM | POA: Diagnosis not present

## 2021-09-02 DIAGNOSIS — J45909 Unspecified asthma, uncomplicated: Secondary | ICD-10-CM | POA: Diagnosis not present

## 2021-09-02 DIAGNOSIS — M19071 Primary osteoarthritis, right ankle and foot: Secondary | ICD-10-CM | POA: Diagnosis not present

## 2021-09-02 DIAGNOSIS — Z7982 Long term (current) use of aspirin: Secondary | ICD-10-CM | POA: Diagnosis not present

## 2021-09-02 DIAGNOSIS — Z471 Aftercare following joint replacement surgery: Secondary | ICD-10-CM | POA: Diagnosis not present

## 2021-09-02 DIAGNOSIS — Z96642 Presence of left artificial hip joint: Secondary | ICD-10-CM | POA: Diagnosis not present

## 2021-09-02 DIAGNOSIS — Z87891 Personal history of nicotine dependence: Secondary | ICD-10-CM | POA: Diagnosis not present

## 2021-09-05 DIAGNOSIS — D649 Anemia, unspecified: Secondary | ICD-10-CM | POA: Diagnosis not present

## 2021-09-05 DIAGNOSIS — I10 Essential (primary) hypertension: Secondary | ICD-10-CM | POA: Diagnosis not present

## 2021-09-05 DIAGNOSIS — Z471 Aftercare following joint replacement surgery: Secondary | ICD-10-CM | POA: Diagnosis not present

## 2021-09-05 DIAGNOSIS — Z7982 Long term (current) use of aspirin: Secondary | ICD-10-CM | POA: Diagnosis not present

## 2021-09-05 DIAGNOSIS — J45909 Unspecified asthma, uncomplicated: Secondary | ICD-10-CM | POA: Diagnosis not present

## 2021-09-05 DIAGNOSIS — M1611 Unilateral primary osteoarthritis, right hip: Secondary | ICD-10-CM | POA: Diagnosis not present

## 2021-09-05 DIAGNOSIS — E538 Deficiency of other specified B group vitamins: Secondary | ICD-10-CM | POA: Diagnosis not present

## 2021-09-05 DIAGNOSIS — M19071 Primary osteoarthritis, right ankle and foot: Secondary | ICD-10-CM | POA: Diagnosis not present

## 2021-09-05 DIAGNOSIS — Z87891 Personal history of nicotine dependence: Secondary | ICD-10-CM | POA: Diagnosis not present

## 2021-09-05 DIAGNOSIS — Z96642 Presence of left artificial hip joint: Secondary | ICD-10-CM | POA: Diagnosis not present

## 2021-09-08 DIAGNOSIS — E538 Deficiency of other specified B group vitamins: Secondary | ICD-10-CM | POA: Diagnosis not present

## 2021-09-08 DIAGNOSIS — M1611 Unilateral primary osteoarthritis, right hip: Secondary | ICD-10-CM | POA: Diagnosis not present

## 2021-09-08 DIAGNOSIS — M19071 Primary osteoarthritis, right ankle and foot: Secondary | ICD-10-CM | POA: Diagnosis not present

## 2021-09-08 DIAGNOSIS — D649 Anemia, unspecified: Secondary | ICD-10-CM | POA: Diagnosis not present

## 2021-09-08 DIAGNOSIS — I10 Essential (primary) hypertension: Secondary | ICD-10-CM | POA: Diagnosis not present

## 2021-09-08 DIAGNOSIS — J45909 Unspecified asthma, uncomplicated: Secondary | ICD-10-CM | POA: Diagnosis not present

## 2021-09-08 DIAGNOSIS — Z7982 Long term (current) use of aspirin: Secondary | ICD-10-CM | POA: Diagnosis not present

## 2021-09-08 DIAGNOSIS — Z96642 Presence of left artificial hip joint: Secondary | ICD-10-CM | POA: Diagnosis not present

## 2021-09-08 DIAGNOSIS — Z87891 Personal history of nicotine dependence: Secondary | ICD-10-CM | POA: Diagnosis not present

## 2021-09-08 DIAGNOSIS — Z471 Aftercare following joint replacement surgery: Secondary | ICD-10-CM | POA: Diagnosis not present

## 2021-09-10 DIAGNOSIS — I10 Essential (primary) hypertension: Secondary | ICD-10-CM | POA: Diagnosis not present

## 2021-09-10 DIAGNOSIS — M19071 Primary osteoarthritis, right ankle and foot: Secondary | ICD-10-CM | POA: Diagnosis not present

## 2021-09-10 DIAGNOSIS — J45909 Unspecified asthma, uncomplicated: Secondary | ICD-10-CM | POA: Diagnosis not present

## 2021-09-10 DIAGNOSIS — M1611 Unilateral primary osteoarthritis, right hip: Secondary | ICD-10-CM | POA: Diagnosis not present

## 2021-09-10 DIAGNOSIS — D649 Anemia, unspecified: Secondary | ICD-10-CM | POA: Diagnosis not present

## 2021-09-10 DIAGNOSIS — Z7982 Long term (current) use of aspirin: Secondary | ICD-10-CM | POA: Diagnosis not present

## 2021-09-10 DIAGNOSIS — Z471 Aftercare following joint replacement surgery: Secondary | ICD-10-CM | POA: Diagnosis not present

## 2021-09-10 DIAGNOSIS — Z87891 Personal history of nicotine dependence: Secondary | ICD-10-CM | POA: Diagnosis not present

## 2021-09-10 DIAGNOSIS — Z96642 Presence of left artificial hip joint: Secondary | ICD-10-CM | POA: Diagnosis not present

## 2021-09-10 DIAGNOSIS — E538 Deficiency of other specified B group vitamins: Secondary | ICD-10-CM | POA: Diagnosis not present

## 2021-09-12 DIAGNOSIS — I10 Essential (primary) hypertension: Secondary | ICD-10-CM | POA: Diagnosis not present

## 2021-09-12 DIAGNOSIS — Z471 Aftercare following joint replacement surgery: Secondary | ICD-10-CM | POA: Diagnosis not present

## 2021-09-12 DIAGNOSIS — Z8701 Personal history of pneumonia (recurrent): Secondary | ICD-10-CM | POA: Diagnosis not present

## 2021-09-12 DIAGNOSIS — D649 Anemia, unspecified: Secondary | ICD-10-CM | POA: Diagnosis not present

## 2021-09-12 DIAGNOSIS — Z96642 Presence of left artificial hip joint: Secondary | ICD-10-CM | POA: Diagnosis not present

## 2021-09-12 DIAGNOSIS — Z7982 Long term (current) use of aspirin: Secondary | ICD-10-CM | POA: Diagnosis not present

## 2021-09-12 DIAGNOSIS — J45909 Unspecified asthma, uncomplicated: Secondary | ICD-10-CM | POA: Diagnosis not present

## 2021-09-12 DIAGNOSIS — E538 Deficiency of other specified B group vitamins: Secondary | ICD-10-CM | POA: Diagnosis not present

## 2021-09-12 DIAGNOSIS — Z87891 Personal history of nicotine dependence: Secondary | ICD-10-CM | POA: Diagnosis not present

## 2021-09-12 DIAGNOSIS — M19071 Primary osteoarthritis, right ankle and foot: Secondary | ICD-10-CM | POA: Diagnosis not present

## 2021-09-12 DIAGNOSIS — M1611 Unilateral primary osteoarthritis, right hip: Secondary | ICD-10-CM | POA: Diagnosis not present

## 2021-09-16 DIAGNOSIS — M19071 Primary osteoarthritis, right ankle and foot: Secondary | ICD-10-CM | POA: Diagnosis not present

## 2021-09-16 DIAGNOSIS — I10 Essential (primary) hypertension: Secondary | ICD-10-CM | POA: Diagnosis not present

## 2021-09-16 DIAGNOSIS — Z7982 Long term (current) use of aspirin: Secondary | ICD-10-CM | POA: Diagnosis not present

## 2021-09-16 DIAGNOSIS — Z8701 Personal history of pneumonia (recurrent): Secondary | ICD-10-CM | POA: Diagnosis not present

## 2021-09-16 DIAGNOSIS — E538 Deficiency of other specified B group vitamins: Secondary | ICD-10-CM | POA: Diagnosis not present

## 2021-09-16 DIAGNOSIS — Z87891 Personal history of nicotine dependence: Secondary | ICD-10-CM | POA: Diagnosis not present

## 2021-09-16 DIAGNOSIS — M1611 Unilateral primary osteoarthritis, right hip: Secondary | ICD-10-CM | POA: Diagnosis not present

## 2021-09-16 DIAGNOSIS — Z471 Aftercare following joint replacement surgery: Secondary | ICD-10-CM | POA: Diagnosis not present

## 2021-09-16 DIAGNOSIS — D649 Anemia, unspecified: Secondary | ICD-10-CM | POA: Diagnosis not present

## 2021-09-16 DIAGNOSIS — J45909 Unspecified asthma, uncomplicated: Secondary | ICD-10-CM | POA: Diagnosis not present

## 2021-09-16 DIAGNOSIS — Z96642 Presence of left artificial hip joint: Secondary | ICD-10-CM | POA: Diagnosis not present

## 2021-09-17 DIAGNOSIS — D510 Vitamin B12 deficiency anemia due to intrinsic factor deficiency: Secondary | ICD-10-CM | POA: Diagnosis not present

## 2021-09-18 DIAGNOSIS — M1611 Unilateral primary osteoarthritis, right hip: Secondary | ICD-10-CM | POA: Diagnosis not present

## 2021-09-18 DIAGNOSIS — E538 Deficiency of other specified B group vitamins: Secondary | ICD-10-CM | POA: Diagnosis not present

## 2021-09-18 DIAGNOSIS — D649 Anemia, unspecified: Secondary | ICD-10-CM | POA: Diagnosis not present

## 2021-09-18 DIAGNOSIS — Z7982 Long term (current) use of aspirin: Secondary | ICD-10-CM | POA: Diagnosis not present

## 2021-09-18 DIAGNOSIS — I10 Essential (primary) hypertension: Secondary | ICD-10-CM | POA: Diagnosis not present

## 2021-09-18 DIAGNOSIS — Z8701 Personal history of pneumonia (recurrent): Secondary | ICD-10-CM | POA: Diagnosis not present

## 2021-09-18 DIAGNOSIS — Z471 Aftercare following joint replacement surgery: Secondary | ICD-10-CM | POA: Diagnosis not present

## 2021-09-18 DIAGNOSIS — M19071 Primary osteoarthritis, right ankle and foot: Secondary | ICD-10-CM | POA: Diagnosis not present

## 2021-09-18 DIAGNOSIS — Z87891 Personal history of nicotine dependence: Secondary | ICD-10-CM | POA: Diagnosis not present

## 2021-09-18 DIAGNOSIS — Z96642 Presence of left artificial hip joint: Secondary | ICD-10-CM | POA: Diagnosis not present

## 2021-09-18 DIAGNOSIS — J45909 Unspecified asthma, uncomplicated: Secondary | ICD-10-CM | POA: Diagnosis not present

## 2021-09-22 DIAGNOSIS — M19071 Primary osteoarthritis, right ankle and foot: Secondary | ICD-10-CM | POA: Diagnosis not present

## 2021-09-22 DIAGNOSIS — M1611 Unilateral primary osteoarthritis, right hip: Secondary | ICD-10-CM | POA: Diagnosis not present

## 2021-09-22 DIAGNOSIS — J45909 Unspecified asthma, uncomplicated: Secondary | ICD-10-CM | POA: Diagnosis not present

## 2021-09-22 DIAGNOSIS — Z8701 Personal history of pneumonia (recurrent): Secondary | ICD-10-CM | POA: Diagnosis not present

## 2021-09-22 DIAGNOSIS — Z471 Aftercare following joint replacement surgery: Secondary | ICD-10-CM | POA: Diagnosis not present

## 2021-09-22 DIAGNOSIS — Z7982 Long term (current) use of aspirin: Secondary | ICD-10-CM | POA: Diagnosis not present

## 2021-09-22 DIAGNOSIS — I10 Essential (primary) hypertension: Secondary | ICD-10-CM | POA: Diagnosis not present

## 2021-09-22 DIAGNOSIS — Z96642 Presence of left artificial hip joint: Secondary | ICD-10-CM | POA: Diagnosis not present

## 2021-09-22 DIAGNOSIS — Z87891 Personal history of nicotine dependence: Secondary | ICD-10-CM | POA: Diagnosis not present

## 2021-09-22 DIAGNOSIS — E538 Deficiency of other specified B group vitamins: Secondary | ICD-10-CM | POA: Diagnosis not present

## 2021-09-22 DIAGNOSIS — D649 Anemia, unspecified: Secondary | ICD-10-CM | POA: Diagnosis not present

## 2021-09-22 NOTE — H&P (Signed)
NAME: Brenda [name] ?MRN:   [id number] ?DOB:   05/08/1961 ? ?   HISTORY AND PHYSICAL ? ?CHIEF COMPLAINT:  left hip pain ? ?HISTORY:   Brenda [name] a 61 y.o. female  with left  Hip Pain ?Patient complains of left hip pain. Onset of the symptoms was several years ago. Inciting event: known DJD. The patient reports the hip pain is worse with weight bearing. Associated symptoms: none. Aggravating symptoms include: any weight bearing, going up and down stairs, and inactivity. Patient has had no prior hip problems. Previous visits for this problem: multiple, this is a longstanding diagnosis. Last seen several weeks ago by me. Evaluation to date: plain films, which were abnormal  osteoarthritis . Marland Kitchen  Plan for left total hip replacement ? ?PAST MEDICAL HISTORY:   ?Past Medical History:  ?Diagnosis Date  ? Anemia   ? Arthritis   ? Asthma   ? Atypical pneumonia   ? a. 12/2013  ? B12 deficiency 10/07/2014  ? Clostridium difficile colitis   ? a. 12/2013.was sick with pneumonia and sepsis  ? Hypertension   ? Lower extremity edema   ? Osteoarthritis of both hips   ? ? ?PAST SURGICAL HISTORY:   ?Past Surgical History:  ?Procedure Laterality Date  ? COLONOSCOPY WITH PROPOFOL N/A 08/30/2017  ? Procedure: COLONOSCOPY WITH PROPOFOL;  Surgeon: Lin Landsman, MD;  Location: [facility] ENDOSCOPY;  Service: Gastroenterology;  Laterality: N/A;  ? COLONOSCOPY WITH PROPOFOL N/A 08/31/2017  ? Procedure: COLONOSCOPY WITH PROPOFOL;  Surgeon: Lin Landsman, MD;  Location: [facility] ENDOSCOPY;  Service: Gastroenterology;  Laterality: N/A;  ? COLONOSCOPY WITH PROPOFOL N/A 08/26/2020  ? Procedure: COLONOSCOPY WITH PROPOFOL;  Surgeon: Lin Landsman, MD;  Location: [facility] ENDOSCOPY;  Service: Gastroenterology;  Laterality: N/A;  ? KNEE SURGERY Left 2014  ? arthroscopy. had to replace tendon with cadaver tissue  ? NECK SURGERY  2014  ? metal plate in neck; I5-0  ? SHOULDER SURGERY Left 2012  ? arthroscopy  ? THROAT SURGERY  2013  ? removed nodules.   benign  ? TONSILLECTOMY  1968  ? TOTAL HIP ARTHROPLASTY Right 02/15/2019  ? Procedure: TOTAL HIP ARTHROPLASTY ANTERIOR APPROACH;  Surgeon: Lovell Sheehan, MD;  Location: [facility] ORS;  Service: Orthopedics;  Laterality: Right;  ? TOTAL HIP ARTHROPLASTY Left 08/27/2021  ? Procedure: TOTAL HIP ARTHROPLASTY ANTERIOR APPROACH;  Surgeon: Lovell Sheehan, MD;  Location: [facility] ORS;  Service: Orthopedics;  Laterality: Left;  ? ? ?MEDICATIONS:   ?No medications prior to admission.  ? ? ?ALLERGIES:   ?Allergies  ?Allergen Reactions  ? Spiriva [Tiotropium Bromide Monohydrate] Other (See Comments)  ?  CLOSES THROAT  ? Shellfish Allergy Diarrhea and Nausea And Vomiting  ? Penicillins   ?  DOES NOT KNOW WHAT TYPE OF REACTION ?  Patient was 61 years old when she had this  ? Codeine Nausea And Vomiting  ? ? ?REVIEW OF SYSTEMS:   Negative except HPI ? ?FAMILY HISTORY:   ?Family History  ?Problem Relation Age of Onset  ? Multiple myeloma Mother   ? Breast cancer Maternal Aunt   ? Lung cancer Maternal Grandmother   ? Parkinson's disease Father   ? ? ?SOCIAL HISTORY:   reports that she has been smoking cigarettes. She has a 4.00 pack-year smoking history. She has never used smokeless tobacco. She reports current alcohol use. She reports that she does not use drugs. ? ?PHYSICAL EXAM:  General appearance: alert, cooperative, and no distress ?Neck: no  JVD and supple, symmetrical, trachea midline ?Resp: clear to auscultation bilaterally ?Cardio: regular rate and rhythm, S1, S2 normal, no murmur, click, rub or gallop ?GI: soft, non-tender; bowel sounds normal; no masses,  no organomegaly ?Pelvic: N/A ?Extremities: extremities normal, atraumatic, no cyanosis or edema and Homans sign is negative, no sign of DVT ?Skin: Skin color, texture, turgor normal. No rashes or lesions ?Neurologic: Alert and oriented X 3, normal strength and tone. Normal symmetric reflexes. Normal coordination and gait  ? ? ?LABORATORY STUDIES: ?No results for input(s): WBC,  HGB, HCT, PLT in the last 72 hours. ? No results for input(s): NA, K, CL, CO2, GLUCOSE, BUN, CREATININE, CALCIUM in the last 72 hours. ? ?STUDIES/RESULTS: ? DG C-Arm 1-60 Min-No Report ? ?Result Date: 08/27/2021 ?Fluoroscopy was utilized by the requesting physician.  No radiographic interpretation.  ? ?DG C-Arm 1-60 Min-No Report ? ?Result Date: 08/27/2021 ?Fluoroscopy was utilized by the requesting physician.  No radiographic interpretation.  ? ?DG HIP UNILAT WITH PELVIS 1V LEFT ? ?Result Date: 08/27/2021 ?CLINICAL DATA:  60 year old female undergoing left hip arthroplasty. EXAM: DG HIP (WITH OR WITHOUT PELVIS) 1V*L* COMPARISON:  None available. FINDINGS: Partially visualized intraoperative fluoroscopic image of left total hip arthroplasty. No complicating features. IMPRESSION: Partially visualized left total hip arthroplasty. Electronically Signed   By: Ruthann Cancer M.D.   On: 08/27/2021 09:49   ? ?ASSESSMENT:  End stage osteoarthritis left hip ?       Principal Problem: ?  History of total hip replacement, left ? ?  ?PLAN:  Left Primary Total Hip ? ? ?Brenda [name] ?09/22/2021. 10:51 AM ? ? ? ?  ?

## 2021-09-25 DIAGNOSIS — E538 Deficiency of other specified B group vitamins: Secondary | ICD-10-CM | POA: Diagnosis not present

## 2021-09-25 DIAGNOSIS — Z87891 Personal history of nicotine dependence: Secondary | ICD-10-CM | POA: Diagnosis not present

## 2021-09-25 DIAGNOSIS — Z471 Aftercare following joint replacement surgery: Secondary | ICD-10-CM | POA: Diagnosis not present

## 2021-09-25 DIAGNOSIS — J45909 Unspecified asthma, uncomplicated: Secondary | ICD-10-CM | POA: Diagnosis not present

## 2021-09-25 DIAGNOSIS — D649 Anemia, unspecified: Secondary | ICD-10-CM | POA: Diagnosis not present

## 2021-09-25 DIAGNOSIS — Z96642 Presence of left artificial hip joint: Secondary | ICD-10-CM | POA: Diagnosis not present

## 2021-09-25 DIAGNOSIS — M19071 Primary osteoarthritis, right ankle and foot: Secondary | ICD-10-CM | POA: Diagnosis not present

## 2021-09-25 DIAGNOSIS — I10 Essential (primary) hypertension: Secondary | ICD-10-CM | POA: Diagnosis not present

## 2021-09-25 DIAGNOSIS — M1611 Unilateral primary osteoarthritis, right hip: Secondary | ICD-10-CM | POA: Diagnosis not present

## 2021-09-25 DIAGNOSIS — Z7982 Long term (current) use of aspirin: Secondary | ICD-10-CM | POA: Diagnosis not present

## 2021-09-25 DIAGNOSIS — Z8701 Personal history of pneumonia (recurrent): Secondary | ICD-10-CM | POA: Diagnosis not present

## 2021-09-30 DIAGNOSIS — Z8701 Personal history of pneumonia (recurrent): Secondary | ICD-10-CM | POA: Diagnosis not present

## 2021-09-30 DIAGNOSIS — Z87891 Personal history of nicotine dependence: Secondary | ICD-10-CM | POA: Diagnosis not present

## 2021-09-30 DIAGNOSIS — Z96642 Presence of left artificial hip joint: Secondary | ICD-10-CM | POA: Diagnosis not present

## 2021-09-30 DIAGNOSIS — M19071 Primary osteoarthritis, right ankle and foot: Secondary | ICD-10-CM | POA: Diagnosis not present

## 2021-09-30 DIAGNOSIS — J45909 Unspecified asthma, uncomplicated: Secondary | ICD-10-CM | POA: Diagnosis not present

## 2021-09-30 DIAGNOSIS — D649 Anemia, unspecified: Secondary | ICD-10-CM | POA: Diagnosis not present

## 2021-09-30 DIAGNOSIS — M1611 Unilateral primary osteoarthritis, right hip: Secondary | ICD-10-CM | POA: Diagnosis not present

## 2021-09-30 DIAGNOSIS — Z7982 Long term (current) use of aspirin: Secondary | ICD-10-CM | POA: Diagnosis not present

## 2021-09-30 DIAGNOSIS — I10 Essential (primary) hypertension: Secondary | ICD-10-CM | POA: Diagnosis not present

## 2021-09-30 DIAGNOSIS — Z471 Aftercare following joint replacement surgery: Secondary | ICD-10-CM | POA: Diagnosis not present

## 2021-09-30 DIAGNOSIS — E538 Deficiency of other specified B group vitamins: Secondary | ICD-10-CM | POA: Diagnosis not present

## 2021-10-02 DIAGNOSIS — Z471 Aftercare following joint replacement surgery: Secondary | ICD-10-CM | POA: Diagnosis not present

## 2021-10-02 DIAGNOSIS — Z87891 Personal history of nicotine dependence: Secondary | ICD-10-CM | POA: Diagnosis not present

## 2021-10-02 DIAGNOSIS — Z8701 Personal history of pneumonia (recurrent): Secondary | ICD-10-CM | POA: Diagnosis not present

## 2021-10-02 DIAGNOSIS — M1611 Unilateral primary osteoarthritis, right hip: Secondary | ICD-10-CM | POA: Diagnosis not present

## 2021-10-02 DIAGNOSIS — Z7982 Long term (current) use of aspirin: Secondary | ICD-10-CM | POA: Diagnosis not present

## 2021-10-02 DIAGNOSIS — M19071 Primary osteoarthritis, right ankle and foot: Secondary | ICD-10-CM | POA: Diagnosis not present

## 2021-10-02 DIAGNOSIS — Z96642 Presence of left artificial hip joint: Secondary | ICD-10-CM | POA: Diagnosis not present

## 2021-10-02 DIAGNOSIS — E538 Deficiency of other specified B group vitamins: Secondary | ICD-10-CM | POA: Diagnosis not present

## 2021-10-02 DIAGNOSIS — J45909 Unspecified asthma, uncomplicated: Secondary | ICD-10-CM | POA: Diagnosis not present

## 2021-10-02 DIAGNOSIS — D649 Anemia, unspecified: Secondary | ICD-10-CM | POA: Diagnosis not present

## 2021-10-02 DIAGNOSIS — I10 Essential (primary) hypertension: Secondary | ICD-10-CM | POA: Diagnosis not present

## 2021-10-03 DIAGNOSIS — Z96642 Presence of left artificial hip joint: Secondary | ICD-10-CM | POA: Diagnosis not present

## 2021-10-07 DIAGNOSIS — Z471 Aftercare following joint replacement surgery: Secondary | ICD-10-CM | POA: Diagnosis not present

## 2021-10-07 DIAGNOSIS — E538 Deficiency of other specified B group vitamins: Secondary | ICD-10-CM | POA: Diagnosis not present

## 2021-10-07 DIAGNOSIS — M1611 Unilateral primary osteoarthritis, right hip: Secondary | ICD-10-CM | POA: Diagnosis not present

## 2021-10-07 DIAGNOSIS — D649 Anemia, unspecified: Secondary | ICD-10-CM | POA: Diagnosis not present

## 2021-10-07 DIAGNOSIS — I10 Essential (primary) hypertension: Secondary | ICD-10-CM | POA: Diagnosis not present

## 2021-10-07 DIAGNOSIS — Z8701 Personal history of pneumonia (recurrent): Secondary | ICD-10-CM | POA: Diagnosis not present

## 2021-10-07 DIAGNOSIS — Z96642 Presence of left artificial hip joint: Secondary | ICD-10-CM | POA: Diagnosis not present

## 2021-10-07 DIAGNOSIS — Z87891 Personal history of nicotine dependence: Secondary | ICD-10-CM | POA: Diagnosis not present

## 2021-10-07 DIAGNOSIS — M19071 Primary osteoarthritis, right ankle and foot: Secondary | ICD-10-CM | POA: Diagnosis not present

## 2021-10-07 DIAGNOSIS — J45909 Unspecified asthma, uncomplicated: Secondary | ICD-10-CM | POA: Diagnosis not present

## 2021-10-07 DIAGNOSIS — Z7982 Long term (current) use of aspirin: Secondary | ICD-10-CM | POA: Diagnosis not present

## 2021-10-09 DIAGNOSIS — Z8701 Personal history of pneumonia (recurrent): Secondary | ICD-10-CM | POA: Diagnosis not present

## 2021-10-09 DIAGNOSIS — M1611 Unilateral primary osteoarthritis, right hip: Secondary | ICD-10-CM | POA: Diagnosis not present

## 2021-10-09 DIAGNOSIS — M19071 Primary osteoarthritis, right ankle and foot: Secondary | ICD-10-CM | POA: Diagnosis not present

## 2021-10-09 DIAGNOSIS — I10 Essential (primary) hypertension: Secondary | ICD-10-CM | POA: Diagnosis not present

## 2021-10-09 DIAGNOSIS — Z96642 Presence of left artificial hip joint: Secondary | ICD-10-CM | POA: Diagnosis not present

## 2021-10-09 DIAGNOSIS — E538 Deficiency of other specified B group vitamins: Secondary | ICD-10-CM | POA: Diagnosis not present

## 2021-10-09 DIAGNOSIS — Z7982 Long term (current) use of aspirin: Secondary | ICD-10-CM | POA: Diagnosis not present

## 2021-10-09 DIAGNOSIS — D649 Anemia, unspecified: Secondary | ICD-10-CM | POA: Diagnosis not present

## 2021-10-09 DIAGNOSIS — Z87891 Personal history of nicotine dependence: Secondary | ICD-10-CM | POA: Diagnosis not present

## 2021-10-09 DIAGNOSIS — J45909 Unspecified asthma, uncomplicated: Secondary | ICD-10-CM | POA: Diagnosis not present

## 2021-10-09 DIAGNOSIS — Z471 Aftercare following joint replacement surgery: Secondary | ICD-10-CM | POA: Diagnosis not present

## 2021-11-12 DIAGNOSIS — D510 Vitamin B12 deficiency anemia due to intrinsic factor deficiency: Secondary | ICD-10-CM | POA: Diagnosis not present

## 2022-01-07 DIAGNOSIS — D510 Vitamin B12 deficiency anemia due to intrinsic factor deficiency: Secondary | ICD-10-CM | POA: Diagnosis not present

## 2022-03-10 DIAGNOSIS — Z96649 Presence of unspecified artificial hip joint: Secondary | ICD-10-CM | POA: Diagnosis not present

## 2022-03-10 DIAGNOSIS — D510 Vitamin B12 deficiency anemia due to intrinsic factor deficiency: Secondary | ICD-10-CM | POA: Diagnosis not present

## 2022-04-21 DIAGNOSIS — E782 Mixed hyperlipidemia: Secondary | ICD-10-CM | POA: Diagnosis not present

## 2022-04-21 DIAGNOSIS — Z1329 Encounter for screening for other suspected endocrine disorder: Secondary | ICD-10-CM | POA: Diagnosis not present

## 2022-04-21 DIAGNOSIS — Z13220 Encounter for screening for lipoid disorders: Secondary | ICD-10-CM | POA: Diagnosis not present

## 2022-04-21 DIAGNOSIS — D508 Other iron deficiency anemias: Secondary | ICD-10-CM | POA: Diagnosis not present

## 2022-04-21 DIAGNOSIS — E559 Vitamin D deficiency, unspecified: Secondary | ICD-10-CM | POA: Diagnosis not present

## 2022-04-21 DIAGNOSIS — D510 Vitamin B12 deficiency anemia due to intrinsic factor deficiency: Secondary | ICD-10-CM | POA: Diagnosis not present

## 2022-04-21 DIAGNOSIS — Z1321 Encounter for screening for nutritional disorder: Secondary | ICD-10-CM | POA: Diagnosis not present

## 2022-04-21 DIAGNOSIS — Z131 Encounter for screening for diabetes mellitus: Secondary | ICD-10-CM | POA: Diagnosis not present

## 2022-04-21 DIAGNOSIS — Z13228 Encounter for screening for other metabolic disorders: Secondary | ICD-10-CM | POA: Diagnosis not present

## 2022-04-24 ENCOUNTER — Other Ambulatory Visit: Payer: Self-pay | Admitting: Obstetrics and Gynecology

## 2022-04-24 DIAGNOSIS — Z1231 Encounter for screening mammogram for malignant neoplasm of breast: Secondary | ICD-10-CM

## 2022-04-29 DIAGNOSIS — D510 Vitamin B12 deficiency anemia due to intrinsic factor deficiency: Secondary | ICD-10-CM | POA: Diagnosis not present

## 2022-04-29 DIAGNOSIS — Z124 Encounter for screening for malignant neoplasm of cervix: Secondary | ICD-10-CM | POA: Diagnosis not present

## 2022-04-29 DIAGNOSIS — D508 Other iron deficiency anemias: Secondary | ICD-10-CM | POA: Diagnosis not present

## 2022-04-29 DIAGNOSIS — E782 Mixed hyperlipidemia: Secondary | ICD-10-CM | POA: Diagnosis not present

## 2022-04-29 DIAGNOSIS — Z01419 Encounter for gynecological examination (general) (routine) without abnormal findings: Secondary | ICD-10-CM | POA: Diagnosis not present

## 2022-04-29 DIAGNOSIS — J449 Chronic obstructive pulmonary disease, unspecified: Secondary | ICD-10-CM | POA: Diagnosis not present

## 2022-05-26 ENCOUNTER — Ambulatory Visit
Admission: RE | Admit: 2022-05-26 | Discharge: 2022-05-26 | Disposition: A | Discharge status: Home / Self Care | Payer: 59 | Source: Ambulatory Visit | Attending: Obstetrics and Gynecology | Admitting: Obstetrics and Gynecology

## 2022-05-26 DIAGNOSIS — Z1231 Encounter for screening mammogram for malignant neoplasm of breast: Secondary | ICD-10-CM | POA: Diagnosis not present

## 2022-06-25 DIAGNOSIS — D510 Vitamin B12 deficiency anemia due to intrinsic factor deficiency: Secondary | ICD-10-CM | POA: Diagnosis not present

## 2022-08-12 DIAGNOSIS — D510 Vitamin B12 deficiency anemia due to intrinsic factor deficiency: Secondary | ICD-10-CM | POA: Diagnosis not present

## 2022-10-14 DIAGNOSIS — D510 Vitamin B12 deficiency anemia due to intrinsic factor deficiency: Secondary | ICD-10-CM | POA: Diagnosis not present

## 2022-12-09 DIAGNOSIS — D510 Vitamin B12 deficiency anemia due to intrinsic factor deficiency: Secondary | ICD-10-CM | POA: Diagnosis not present

## 2023-02-16 DIAGNOSIS — D510 Vitamin B12 deficiency anemia due to intrinsic factor deficiency: Secondary | ICD-10-CM | POA: Diagnosis not present

## 2023-03-30 DIAGNOSIS — D510 Vitamin B12 deficiency anemia due to intrinsic factor deficiency: Secondary | ICD-10-CM | POA: Diagnosis not present

## 2023-05-14 ENCOUNTER — Other Ambulatory Visit: Payer: Self-pay | Admitting: Obstetrics and Gynecology

## 2023-05-14 DIAGNOSIS — Z1231 Encounter for screening mammogram for malignant neoplasm of breast: Secondary | ICD-10-CM

## 2023-06-02 ENCOUNTER — Ambulatory Visit
Admission: RE | Admit: 2023-06-02 | Discharge: 2023-06-02 | Disposition: A | Discharge status: Home / Self Care | Payer: 59 | Source: Ambulatory Visit | Attending: Obstetrics and Gynecology | Admitting: Obstetrics and Gynecology

## 2023-06-02 DIAGNOSIS — Z1231 Encounter for screening mammogram for malignant neoplasm of breast: Secondary | ICD-10-CM | POA: Diagnosis not present

## 2023-06-08 DIAGNOSIS — Z01419 Encounter for gynecological examination (general) (routine) without abnormal findings: Secondary | ICD-10-CM | POA: Diagnosis not present

## 2023-07-21 DIAGNOSIS — D510 Vitamin B12 deficiency anemia due to intrinsic factor deficiency: Secondary | ICD-10-CM | POA: Diagnosis not present

## 2023-09-22 DIAGNOSIS — D510 Vitamin B12 deficiency anemia due to intrinsic factor deficiency: Secondary | ICD-10-CM | POA: Diagnosis not present

## 2023-11-10 DIAGNOSIS — D510 Vitamin B12 deficiency anemia due to intrinsic factor deficiency: Secondary | ICD-10-CM | POA: Diagnosis not present

## 2024-04-11 ENCOUNTER — Encounter: Payer: Self-pay | Admitting: Internal Medicine

## 2024-04-19 ENCOUNTER — Encounter: Payer: Self-pay | Admitting: Internal Medicine

## 2024-04-26 ENCOUNTER — Ambulatory Visit: Admitting: Family Medicine

## 2024-04-26 ENCOUNTER — Encounter: Payer: Self-pay | Admitting: Family Medicine

## 2024-04-26 VITALS — BP 138/88 | HR 87 | Resp 16 | Ht 64.0 in | Wt 189.0 lb

## 2024-04-26 DIAGNOSIS — E538 Deficiency of other specified B group vitamins: Secondary | ICD-10-CM | POA: Diagnosis not present

## 2024-04-26 DIAGNOSIS — I5032 Chronic diastolic (congestive) heart failure: Secondary | ICD-10-CM

## 2024-04-26 DIAGNOSIS — Z13220 Encounter for screening for lipoid disorders: Secondary | ICD-10-CM | POA: Diagnosis not present

## 2024-04-26 DIAGNOSIS — E559 Vitamin D deficiency, unspecified: Secondary | ICD-10-CM | POA: Diagnosis not present

## 2024-04-26 DIAGNOSIS — Z23 Encounter for immunization: Secondary | ICD-10-CM

## 2024-04-26 DIAGNOSIS — Z1231 Encounter for screening mammogram for malignant neoplasm of breast: Secondary | ICD-10-CM

## 2024-04-26 DIAGNOSIS — Z122 Encounter for screening for malignant neoplasm of respiratory organs: Secondary | ICD-10-CM

## 2024-04-26 DIAGNOSIS — Z0000 Encounter for general adult medical examination without abnormal findings: Secondary | ICD-10-CM

## 2024-04-26 DIAGNOSIS — J449 Chronic obstructive pulmonary disease, unspecified: Secondary | ICD-10-CM

## 2024-04-26 DIAGNOSIS — Z124 Encounter for screening for malignant neoplasm of cervix: Secondary | ICD-10-CM

## 2024-04-26 MED ORDER — CYANOCOBALAMIN 1000 MCG/ML IJ SOLN
1000.0000 ug | Freq: Once | INTRAMUSCULAR | Status: AC
  Administered 2024-04-26: 1000 ug via INTRAMUSCULAR

## 2024-04-26 MED ORDER — METOPROLOL SUCCINATE ER 100 MG PO TB24: 100.0000 mg | ORAL_TABLET | Freq: Every day | ORAL | 0 refills | Status: DC

## 2024-04-26 MED ORDER — FUROSEMIDE 20 MG PO TABS: 20.0000 mg | ORAL_TABLET | Freq: Every day | ORAL | 0 refills | Status: DC

## 2024-04-26 MED ORDER — ALBUTEROL SULFATE HFA 108 (90 BASE) MCG/ACT IN AERS: 2.0000 | INHALATION_SPRAY | Freq: Four times a day (QID) | RESPIRATORY_TRACT | 1 refills | Status: AC | PRN

## 2024-04-26 MED ORDER — ASPIRIN 81 MG PO CHEW: 81.0000 mg | CHEWABLE_TABLET | Freq: Every day | ORAL | 0 refills | Status: AC

## 2024-04-26 MED ORDER — CYANOCOBALAMIN 1000 MCG/ML IJ SOLN: INTRAMUSCULAR | 2 refills | Status: DC

## 2024-04-26 NOTE — Progress Notes (Signed)
 New Patient Office Visit  Subjective    Patient ID: Brenda [name], female    DOB: 1960/05/31  Age: 63 y.o. MRN: [id number]  CC:  Chief Complaint  Patient presents with   Establish Care    HPI Brenda [name] presents to establish care. She was being seen by GYN who managed gynecologic care but also served as her PCP managing other chronic conditions. She requests a referral to GYN to re-establish care to continue well woman exams and paps with GYN. She reports last pap was January of this year (2025) and reports this was normal. She reports she is due for her mammogram next January. Last mammogram 05/2023, will place order today.   She brings with her today her B12 injection stating she does B12 injections every other month. Last B12 injection 03/01/2024. Will obtain labs and proceed with B12 injection in office.   She had previously been taking prescription Ergocalciferol  however insurance no longer covers medication. Advised to transition to OTC vitamin D3 5,000 international units  once daily.   She voices she needs medications refilled. She requests refills on Furosemide , Metoprolol , Albuterol  and B12.  She states she has been using Albuterol  once daily stating that is how she was instructed to use medication.    Last colonoscopy 08/2020, recommended 5 year (08/2025) repeat for surveillance.   Outpatient Encounter Medications as of 04/26/2024  Medication Sig   Biotin 89999 MCG TABS Take 10,000 mcg by mouth daily.   calcium carbonate (OSCAL) 1500 (600 Ca) MG TABS tablet Take 600 mg of elemental calcium by mouth daily with breakfast.   Coenzyme Q10 100 MG TABS Take 100 mg by mouth daily.    fexofenadine (ALLEGRA) 180 MG tablet Take 180 mg by mouth daily.   MULTIPLE VITAMIN PO Take 1 tablet by mouth daily.   Omega-3 Fatty Acids (FISH OIL) 1000 MG CAPS Take 1,000 mg by mouth daily.    Potassium Gluconate 550 (90 K) MG TABS Take 550 mg by mouth daily.   Probiotic Product (PROBIOTIC &  ACIDOPHILUS EX ST PO) Take 1 capsule by mouth daily.    sodium chloride  (OCEAN) 0.65 % SOLN nasal spray Place 1 spray into both nostrils as needed for congestion.   Vitamin D , Ergocalciferol , (DRISDOL) 1.25 MG (50000 UT) CAPS capsule Take 50,000 Units by mouth every 30 (thirty) days.   [DISCONTINUED] acetaminophen  (TYLENOL ) 500 MG tablet Take 1,000 mg by mouth every 6 (six) hours as needed for moderate pain.   [DISCONTINUED] albuterol  (PROVENTIL  HFA;VENTOLIN  HFA) 108 (90 BASE) MCG/ACT inhaler Inhale 2 puffs into the lungs every 6 (six) hours as needed for wheezing or shortness of breath.   [DISCONTINUED] aspirin  81 MG chewable tablet Chew 1 tablet (81 mg total) by mouth 2 (two) times daily.   [DISCONTINUED] cyanocobalamin  (,VITAMIN B-12,) 1000 MCG/ML injection Inject 1,000 mcg into the muscle as needed. Every other month (March, May, ...SABRASABRASABRA)   [DISCONTINUED] furosemide  (LASIX ) 20 MG tablet Take 1 tablet (20 mg total) by mouth daily.   [DISCONTINUED] metoprolol  succinate (TOPROL -XL) 100 MG 24 hr tablet Take 1 tablet (100 mg total) by mouth daily. Take with or immediately following a meal.   albuterol  (VENTOLIN  HFA) 108 (90 Base) MCG/ACT inhaler Inhale 2 puffs into the lungs every 6 (six) hours as needed for wheezing or shortness of breath.   aspirin  81 MG chewable tablet Chew 1 tablet (81 mg total) by mouth daily.   cyanocobalamin  (VITAMIN B12) 1000 MCG/ML injection Inject 1mL intramuscularly every  other month   furosemide  (LASIX ) 20 MG tablet Take 1 tablet (20 mg total) by mouth daily.   metoprolol  succinate (TOPROL -XL) 100 MG 24 hr tablet Take 1 tablet (100 mg total) by mouth daily. Take with or immediately following a meal.   [DISCONTINUED] diclofenac sodium (VOLTAREN) 1 % GEL Apply 2 g topically 3 (three) times daily as needed (pain).    [DISCONTINUED] docusate sodium  (COLACE) 100 MG capsule Take 1 capsule (100 mg total) by mouth 2 (two) times daily.   [DISCONTINUED] HYDROcodone -acetaminophen   (NORCO/VICODIN) 5-325 MG tablet Take 1-2 tablets by mouth every 4 (four) hours as needed for moderate pain (pain score 4-6). (Patient taking differently: Take 1-2 tablets by mouth every 6 (six) hours as needed for moderate pain or severe pain.)   [DISCONTINUED] methocarbamol  (ROBAXIN ) 500 MG tablet Take 500 mg by mouth daily.   [DISCONTINUED] methocarbamol  (ROBAXIN ) 500 MG tablet Take 1 tablet (500 mg total) by mouth 4 (four) times daily.   [DISCONTINUED] naproxen (NAPROSYN) 500 MG tablet Take 500 mg by mouth in the morning and at bedtime.   No facility-administered encounter medications on file as of 04/26/2024.    Past Medical History:  Diagnosis Date   Anemia    Arthritis    Asthma    Atypical pneumonia    a. 12/2013   B12 deficiency 10/07/2014   Clostridium difficile colitis    a. 12/2013.was sick with pneumonia and sepsis   Hypertension    Lower extremity edema    Osteoarthritis of both hips     Past Surgical History:  Procedure Laterality Date   COLONOSCOPY WITH PROPOFOL  N/A 08/30/2017   Procedure: COLONOSCOPY WITH PROPOFOL ;  Surgeon: Unk Corinn Skiff, MD;  Location: [facility] ENDOSCOPY;  Service: Gastroenterology;  Laterality: N/A;   COLONOSCOPY WITH PROPOFOL  N/A 08/31/2017   Procedure: COLONOSCOPY WITH PROPOFOL ;  Surgeon: Unk Corinn Skiff, MD;  Location: [facility] ENDOSCOPY;  Service: Gastroenterology;  Laterality: N/A;   COLONOSCOPY WITH PROPOFOL  N/A 08/26/2020   Procedure: COLONOSCOPY WITH PROPOFOL ;  Surgeon: Unk Corinn Skiff, MD;  Location: [facility] ENDOSCOPY;  Service: Gastroenterology;  Laterality: N/A;   KNEE SURGERY Left 2014   arthroscopy. had to replace tendon with cadaver tissue   NECK SURGERY  2014   metal plate in neck; R5-4   SHOULDER SURGERY Left 2012   arthroscopy   THROAT SURGERY  2013   removed nodules.  benign   TONSILLECTOMY  1968   TOTAL HIP ARTHROPLASTY Right 02/15/2019   Procedure: TOTAL HIP ARTHROPLASTY ANTERIOR APPROACH;  Surgeon: Leora Lynwood SAUNDERS, MD;   Location: [facility] ORS;  Service: Orthopedics;  Laterality: Right;   TOTAL HIP ARTHROPLASTY Left 08/27/2021   Procedure: TOTAL HIP ARTHROPLASTY ANTERIOR APPROACH;  Surgeon: Leora Lynwood SAUNDERS, MD;  Location: [facility] ORS;  Service: Orthopedics;  Laterality: Left;    Family History  Problem Relation Age of Onset   Multiple myeloma Mother    Breast cancer Maternal Aunt    Lung cancer Maternal Grandmother    Parkinson's disease Father     Social History   Socioeconomic History   Marital status: Divorced    Spouse name: Not on file   Number of children: Not on file   Years of education: Not on file   Highest education level: Not on file  Occupational History   Not on file  Tobacco Use   Smoking status: Some Days    Current packs/day: 0.25    Average packs/day: 0.3 packs/day for 20.0 years (5.0 ttl pk-yrs)  Types: Cigarettes   Smokeless tobacco: Never   Tobacco comments:    smokes about 2 cigs per day  Vaping Use   Vaping status: Never Used  Substance and Sexual Activity   Alcohol use: Yes    Comment: SOCIAL DRINKER 1-2 vodka a week   Drug use: No   Sexual activity: Not Currently  Other Topics Concern   Not on file  Social History Narrative   Lives alone.  Sister, Nathanel from [place]  will be coming to stay with patient for 2 weeks.   Social Drivers of Corporate Investment Banker Strain: Low Risk  (04/26/2024)   Overall Financial Resource Strain (CARDIA)    Difficulty of Paying Living Expenses: Not hard at all  Food Insecurity: No Food Insecurity (04/26/2024)   Hunger Vital Sign    Worried About Running Out of Food in the Last Year: Never true    Ran Out of Food in the Last Year: Never true  Transportation Needs: No Transportation Needs (04/26/2024)   PRAPARE - Administrator, Civil Service (Medical): No    Lack of Transportation (Non-Medical): No  Physical Activity: Inactive (04/26/2024)   Exercise Vital Sign    Days of Exercise per Week: 0 days    Minutes  of Exercise per Session: 0 min  Stress: No Stress Concern Present (04/26/2024)   [employer] of Occupational Health - Occupational Stress Questionnaire    Feeling of Stress: Only a little  Social Connections: Socially Isolated (04/26/2024)   Social Connection and Isolation Panel    Frequency of Communication with Friends and Family: More than three times a week    Frequency of Social Gatherings with Friends and Family: More than three times a week    Attends Religious Services: Never    Database Administrator or Organizations: No    Attends Banker Meetings: Never    Marital Status: Divorced  Catering Manager Violence: Not At Risk (04/26/2024)   Humiliation, Afraid, Rape, and Kick questionnaire    Fear of Current or Ex-Partner: No    Emotionally Abused: No    Physically Abused: No    Sexually Abused: No    Review of Systems  Constitutional:  Negative for malaise/fatigue.  Eyes:  Negative for blurred vision and double vision.  Respiratory:  Negative for shortness of breath.   Cardiovascular:  Negative for chest pain, palpitations, orthopnea and leg swelling.  Neurological:  Negative for dizziness and headaches.        Objective    BP 138/88   Pulse 87   Resp 16   Ht 5' 4 (1.626 m)   Wt 189 lb (85.7 kg)   SpO2 96%   BMI 32.44 kg/m   Physical Exam Constitutional:      Appearance: Normal appearance.  HENT:     Head: Normocephalic and atraumatic.  Eyes:     Conjunctiva/sclera: Conjunctivae normal.     Pupils: Pupils are equal, round, and reactive to light.  Cardiovascular:     Rate and Rhythm: Normal rate and regular rhythm.     Heart sounds: Normal heart sounds.  Pulmonary:     Effort: Pulmonary effort is normal. No respiratory distress.     Breath sounds: Wheezing present.  Musculoskeletal:     Cervical back: Neck supple. No rigidity.     Right lower leg: No edema.     Left lower leg: No edema.  Lymphadenopathy:     Cervical: No cervical  adenopathy.  Neurological:     General: No focal deficit present.     Mental Status: She is alert and oriented to person, place, and time.  Psychiatric:        Mood and Affect: Mood normal.        Behavior: Behavior normal.       Assessment & Plan:   1. B12 deficiency (Primary) B12 deficiency previously managed by GYN with plan to administer B12 injections once every other month. Patient advises that this has adequately controlled B12 deficiency. She reports she is due for her B12 injection today since her last B12 injection was 02/2024. Will first obtain labs and then proceed with B12 injection. She is agreeable. - CBC with Differential/Platelet - B12 - cyanocobalamin  (VITAMIN B12) 1000 MCG/ML injection; Inject 1mL intramuscularly every other month  Dispense: 1 mL; Refill: 2  2. Chronic diastolic CHF (congestive heart failure) (HCC) Chronic diastolic CHF diagnosis dating back to 2015. She reports she had retained fluid during a hospitalization. Echo with preserved EJF in 2015. She denies shortness of breath, palpitations,chest pain, orthopnea, or leg swelling.  -Continue medications as prescribed without changes at this time. Refills given for Lasix  20mg  once daily and Metoprolol  100mg  once daily.  -Labs obtained.  -Return precautions advised and red flag symptoms reviewed. She verbalizes understanding.  - Comprehensive Metabolic Panel (CMET) - Lipid Profile - aspirin  81 MG chewable tablet; Chew 1 tablet (81 mg total) by mouth daily.  Dispense: 90 tablet; Refill: 0 - furosemide  (LASIX ) 20 MG tablet; Take 1 tablet (20 mg total) by mouth daily.  Dispense: 90 tablet; Refill: 0 - metoprolol  succinate (TOPROL -XL) 100 MG 24 hr tablet; Take 1 tablet (100 mg total) by mouth daily. Take with or immediately following a meal.  Dispense: 90 tablet; Refill: 0  3. Chronic obstructive pulmonary disease, unspecified COPD type (HCC) COPD stable without maintenance inhaler. She admits she had been  using Albuterol  once daily but due to this was her understanding and instructions on intended use. We reviewed that Albuterol  is a rescue inhaler and intended to be used only as needed with episodes of shortness of breath or wheezing. Wheezing heard in right upper and lower lung bases. Will hold on starting maintenance inhaler at this time. Return precautions reviewed.  -CT lung cancer screening ordered with COPD diagnosis and tobacco usage/smoking hx . She is agreeable.  - albuterol  (VENTOLIN  HFA) 108 (90 Base) MCG/ACT inhaler; Inhale 2 puffs into the lungs every 6 (six) hours as needed for wheezing or shortness of breath.  Dispense: 1 each; Refill: 1  4. Vitamin D  deficiency Vitamin D  deficiency previously managed with Ergocalciferol  prescription. Will transition to over the counter vitamin D3 5,000 international units  once daily after completion of remaining Ergocalciferol  prescription. Vitamin D  level obtained to assess effectiveness of treatment.   - Vitamin D  (25 hydroxy)  5. Breast cancer screening by mammogram Mammogram due in 05/2024, patient reports last completed in 05/2023 and negative. Mammogram screening ordered today. - MM 3D SCREENING MAMMOGRAM BILATERAL BREAST; Future  6. Immunization due Prevnar 20 vaccination due and given at today's visit.  - Pneumococcal conjugate vaccine 20-valent (Prevnar 20)  7. Lipid screening She denies prior hx of dyslipidemia. With associated cardiac chronic conditions with screen for dyslipidemia with lipid profile with today's labs.  - Lipid Profile  8. Pap smear for cervical cancer screening Pap smear up to date per patient report. She does request to re-establish care with GYN for ongoing gynecologic services including paps.  She reports her last GYN is no out of network for insurance coverage being why she needs a referral to a new GYN.  - Ambulatory referral to Obstetrics / Gynecology  9. Screening for lung cancer She denies hx of previous  lung cancer screenings. Chronic conditions includes tobacco usage and COPD diagnosis. Will order CT chest lung cancer screening. She is agreeable.  - CT CHEST LUNG CA SCREEN LOW DOSE W/O CM; Future  10. Healthcare maintenance Pap UTD per patient report. Mammogram due in 05/2024. CRC screening UTD. Colonoscopy last completed 08/2020 and recommended 5 year (08/2025) follow up.  - MM 3D SCREENING MAMMOGRAM BILATERAL BREAST; Future - CT CHEST LUNG CA SCREEN LOW DOSE W/O CM; Future    Return in about 2 months (around 06/27/2024).   LAYMON LOISE CORE, FNP

## 2024-04-27 ENCOUNTER — Other Ambulatory Visit (HOSPITAL_COMMUNITY): Payer: Self-pay

## 2024-04-27 ENCOUNTER — Ambulatory Visit: Payer: Self-pay | Admitting: Family Medicine

## 2024-04-27 LAB — COMPREHENSIVE METABOLIC PANEL WITH GFR
AG Ratio: 1.7 (calc) (ref 1.0–2.5)
ALT: 23 U/L (ref 6–29)
AST: 35 U/L (ref 10–35)
Albumin: 4.8 g/dL (ref 3.6–5.1)
Alkaline phosphatase (APISO): 73 U/L (ref 37–153)
BUN: 12 mg/dL (ref 7–25)
CO2: 35 mmol/L — ABNORMAL HIGH (ref 20–32)
Calcium: 10.6 mg/dL — ABNORMAL HIGH (ref 8.6–10.4)
Chloride: 97 mmol/L — ABNORMAL LOW (ref 98–110)
Creat: 0.58 mg/dL (ref 0.50–1.05)
Globulin: 2.9 g/dL (ref 1.9–3.7)
Glucose, Bld: 110 mg/dL — ABNORMAL HIGH (ref 65–99)
Potassium: 4.7 mmol/L (ref 3.5–5.3)
Sodium: 141 mmol/L (ref 135–146)
Total Bilirubin: 0.4 mg/dL (ref 0.2–1.2)
Total Protein: 7.7 g/dL (ref 6.1–8.1)
eGFR: 102 mL/min/1.73m2 (ref >=60)

## 2024-04-27 LAB — CBC WITH DIFFERENTIAL/PLATELET
Absolute Lymphocytes: 1784 {cells}/uL (ref 850–3900)
Absolute Monocytes: 536 {cells}/uL (ref 200–950)
Basophils Absolute: 80 {cells}/uL (ref 0–200)
Basophils Relative: 1 %
Eosinophils Absolute: 96 {cells}/uL (ref 15–500)
Eosinophils Relative: 1.2 %
HCT: 43.4 % (ref 35.9–46.0)
Hemoglobin: 14.7 g/dL (ref 11.7–15.5)
MCH: 32.9 pg (ref 27.0–33.0)
MCHC: 33.9 g/dL (ref 31.6–35.4)
MCV: 97.1 fL (ref 81.4–101.7)
MPV: 9.8 fL (ref 7.5–12.5)
Monocytes Relative: 6.7 %
Neutro Abs: 5504 {cells}/uL (ref 1500–7800)
Neutrophils Relative %: 68.8 %
Platelets: 321 Thousand/uL (ref 140–400)
RBC: 4.47 Million/uL (ref 3.80–5.10)
RDW: 12 % (ref 11.0–15.0)
Total Lymphocyte: 22.3 %
WBC: 8 Thousand/uL (ref 3.8–10.8)

## 2024-04-27 LAB — VITAMIN D 25 HYDROXY (VIT D DEFICIENCY, FRACTURES): Vit D, 25-Hydroxy: 59 ng/mL (ref 30–100)

## 2024-04-27 LAB — LIPID PANEL
Cholesterol: 258 mg/dL — ABNORMAL HIGH (ref <200)
HDL: 76 mg/dL (ref >=50)
LDL Cholesterol (Calc): 153 mg/dL — ABNORMAL HIGH
Non-HDL Cholesterol (Calc): 182 mg/dL — ABNORMAL HIGH (ref <130)
Total CHOL/HDL Ratio: 3.4 (calc) (ref <5.0)
Triglycerides: 158 mg/dL — ABNORMAL HIGH (ref <150)

## 2024-04-27 LAB — VITAMIN B12: Vitamin B-12: 368 pg/mL (ref 200–1100)

## 2024-05-09 ENCOUNTER — Ambulatory Visit: Admission: RE | Admit: 2024-05-09 | Source: Ambulatory Visit

## 2024-06-02 ENCOUNTER — Ambulatory Visit
Admission: RE | Admit: 2024-06-02 | Discharge: 2024-06-02 | Disposition: A | Discharge status: Home / Self Care | Source: Ambulatory Visit | Attending: Family Medicine | Admitting: Family Medicine

## 2024-06-02 DIAGNOSIS — Z1231 Encounter for screening mammogram for malignant neoplasm of breast: Secondary | ICD-10-CM | POA: Insufficient documentation

## 2024-06-02 DIAGNOSIS — Z0000 Encounter for general adult medical examination without abnormal findings: Secondary | ICD-10-CM | POA: Insufficient documentation

## 2024-06-21 ENCOUNTER — Encounter: Payer: Self-pay | Admitting: Family Medicine

## 2024-06-21 ENCOUNTER — Ambulatory Visit (INDEPENDENT_AMBULATORY_CARE_PROVIDER_SITE_OTHER): Admitting: Family Medicine

## 2024-06-21 VITALS — BP 128/70 | HR 83 | Resp 16 | Ht 64.0 in | Wt 184.0 lb

## 2024-06-21 DIAGNOSIS — E782 Mixed hyperlipidemia: Secondary | ICD-10-CM | POA: Diagnosis not present

## 2024-06-21 DIAGNOSIS — E559 Vitamin D deficiency, unspecified: Secondary | ICD-10-CM | POA: Diagnosis not present

## 2024-06-21 DIAGNOSIS — E538 Deficiency of other specified B group vitamins: Secondary | ICD-10-CM

## 2024-06-21 DIAGNOSIS — J449 Chronic obstructive pulmonary disease, unspecified: Secondary | ICD-10-CM | POA: Diagnosis not present

## 2024-06-21 DIAGNOSIS — Z0000 Encounter for general adult medical examination without abnormal findings: Secondary | ICD-10-CM | POA: Diagnosis not present

## 2024-06-21 DIAGNOSIS — I5032 Chronic diastolic (congestive) heart failure: Secondary | ICD-10-CM

## 2024-06-21 MED ORDER — FUROSEMIDE 20 MG PO TABS: 20.0000 mg | ORAL_TABLET | Freq: Every day | ORAL | 0 refills | Status: AC

## 2024-06-21 MED ORDER — CYANOCOBALAMIN 1000 MCG/ML IJ SOLN
1000.0000 ug | Freq: Once | INTRAMUSCULAR | Status: AC
  Administered 2024-06-21: 1000 ug via INTRAMUSCULAR

## 2024-06-21 MED ORDER — METOPROLOL SUCCINATE ER 100 MG PO TB24: 100.0000 mg | ORAL_TABLET | Freq: Every day | ORAL | 0 refills | Status: AC

## 2024-06-21 MED ORDER — CYANOCOBALAMIN 1000 MCG/ML IJ SOLN: INTRAMUSCULAR | 3 refills | Status: AC

## 2024-06-21 NOTE — Assessment & Plan Note (Addendum)
 B12 deficiency controlled, last vitamin B12 level 368 in 04/2024.   -Continue vitamin B12 1,000mcg injection once every other month, medication refill provided  Orders:   cyanocobalamin  (VITAMIN B12) injection 1,000 mcg   cyanocobalamin  (VITAMIN B12) 1000 MCG/ML injection; Inject 1mL intramuscularly every other month

## 2024-06-21 NOTE — Progress Notes (Signed)
 "  Established Patient Office Visit  Subjective   Patient ID: Brenda [name], female    DOB: [birth year]  Age: 64 y.o. MRN: [id number]  Chief Complaint  Patient presents with   Follow-up    2 months    HPI Brenda [name] is a pleasant 64 year old female who returns for 2 month follow up. B12 injection given at time of visit today. B12 deficiency controlled with current regimen, advised to continue B12 injections once every other month. Will provide refills today.   F/u on COPD. Since her last visit she has not required use of Albuterol . She voices she has not felt short of breath or felt that she was wheezing. Will not start maintenance therapy at this time.   F/u on GYN referral placed at last visit. She voices she has an appointment scheduled with GYN in March. Last pap completed with Dr. Nonda in January 2025 and reports this was normal. She voices she does not have records of her pap, and also voices prior GYN has retired. Will attempt to get records.   F/u on vitamin D  deficiency and patient reports she has not started OTC vitamin D3 5,000 international units  as discussed at last visit because she has not yet ran out of Ergocalciferol . She voices she will start the OTC vitamin D3 when she runs out of the prescription in the next 3 months.    Review of Systems  Respiratory:  Negative for shortness of breath and wheezing.   Cardiovascular:  Negative for chest pain, palpitations and leg swelling.      Objective:     BP 128/70   Pulse 83   Resp 16   Ht 5' 4 (1.626 m)   Wt 184 lb (83.5 kg)   SpO2 99%   BMI 31.58 kg/m  BP Readings from Last 3 Encounters:  06/21/24 128/70  04/26/24 138/88  08/28/21 103/73   Wt Readings from Last 3 Encounters:  06/21/24 184 lb (83.5 kg)  04/26/24 189 lb (85.7 kg)  08/27/21 195 lb 15.8 oz (88.9 kg)   SpO2 Readings from Last 3 Encounters:  06/21/24 99%  04/26/24 96%  08/28/21 95%      Physical Exam Constitutional:       Appearance: Normal appearance.  Cardiovascular:     Rate and Rhythm: Normal rate and regular rhythm.     Heart sounds: Normal heart sounds.  Pulmonary:     Effort: Pulmonary effort is normal. No respiratory distress.     Breath sounds: Wheezing present.  Skin:    General: Skin is warm and dry.  Neurological:     Mental Status: She is alert. Mental status is at baseline.  Psychiatric:        Mood and Affect: Mood normal.        Behavior: Behavior normal.     Last CBC Lab Results  Component Value Date   WBC 8.0 04/26/2024   HGB 14.7 04/26/2024   HCT 43.4 04/26/2024   MCV 97.1 04/26/2024   MCH 32.9 04/26/2024   RDW 12.0 04/26/2024   PLT 321 04/26/2024   Last metabolic panel Lab Results  Component Value Date   GLUCOSE 110 (H) 04/26/2024   NA 141 04/26/2024   K 4.7 04/26/2024   CL 97 (L) 04/26/2024   CO2 35 (H) 04/26/2024   BUN 12 04/26/2024   CREATININE 0.58 04/26/2024   EGFR 102 04/26/2024   CALCIUM 10.6 (H) 04/26/2024   PHOS 3.4 02/18/2019  PROT 7.7 04/26/2024   ALBUMIN 3.1 (L) 02/18/2019   BILITOT 0.4 04/26/2024   ALKPHOS 48 02/18/2019   AST 35 04/26/2024   ALT 23 04/26/2024   ANIONGAP 12 08/15/2021   Last lipids Lab Results  Component Value Date   CHOL 258 (H) 04/26/2024   HDL 76 04/26/2024   LDLCALC 153 (H) 04/26/2024   TRIG 158 (H) 04/26/2024   CHOLHDL 3.4 04/26/2024    Last thyroid functions Lab Results  Component Value Date   TSH 1.29 12/22/2013   Last vitamin D  Lab Results  Component Value Date   VD25OH 59 04/26/2024   Last vitamin B12 and Folate Lab Results  Component Value Date   VITAMINB12 368 04/26/2024          Assessment & Plan:   Assessment & Plan Mixed hyperlipidemia Hyperlipidemia is uncontrolled , last LDL is 153 in 04/2024 . The 10-year ASCVD risk score (Arnett DK, et al., 2019) is: 10.6%  Patient denies chest pain, palpitations, shortness of breath, headaches or dizziness.  Discussed increased risk for  cardiovascular event with uncontrolled hyperlipidemia and ASCVD risk score as noted. Discussed importance of dietary changes, exercise and tobacco cessation. Recommended starting statin therapy to reduce cholesterol levels and risk of cardiovascular event, however patient denies statin medication. She voices she would prefer to lower her cholesterol through diet.     -Discussed starting Rosuvastatin 5 mg nightly, but patient denies  -Advised to continue with lifestyle modifications including diet, exercise and tobacco cessation.  -Written education provided with AVS regarding dietary changes for management of hyperlipidemia -Written education provided with AVS for overview of Rosuvastatin -Recommended 150 minutes of moderate activity/ exercise weekly  -Return in 3 months for f/u and fasting labs. She is agreeable.   Chronic obstructive pulmonary disease, unspecified COPD type (HCC) COPD stable. Denies frequent use of rescue inhaler. Patient denies shortness of breath, wheezing or chest pain.   V/s stable. All lung fields wheezing to auscultation bilaterally.   -Return precautions advised for which patient voices understanding.     B12 deficiency B12 deficiency controlled, last vitamin B12 level 368 in 04/2024.   -Continue vitamin B12 1,000mcg injection once every other month, medication refill provided  Orders:   cyanocobalamin  (VITAMIN B12) injection 1,000 mcg   cyanocobalamin  (VITAMIN B12) 1000 MCG/ML injection; Inject 1mL intramuscularly every other month  Chronic diastolic CHF (congestive heart failure) (HCC) Chronic diastolic CHF diagnosis dating back to 2015. Echo with preserved EJF in 2015.  She denies shortness of breath, palpitations,chest pain, orthopnea, or leg swelling.   V/s stable. No abnormal weight gain, weight loss of 5lbs since last visit. Weight loss intentional as she reports dietary changes where she stopped eating chocolate.     -Continue Metoprolol  100 mg once  daily, refill provided -Continue Lasix  20 mg once daily, refill provided  -Return in 3 months for f/u    Orders:   furosemide  (LASIX ) 20 MG tablet; Take 1 tablet (20 mg total) by mouth daily.   metoprolol  succinate (TOPROL -XL) 100 MG 24 hr tablet; Take 1 tablet (100 mg total) by mouth daily. Take with or immediately following a meal.  Vitamin D  deficiency Vitamin D  deficiency controlled, last vitamin D  level 59 in 04/2024  -Continue OTC vitamin D3 5,000 international units once daily     Healthcare maintenance -Mammogram is UTD, mammogram completed on 06/02/24. Negative for malignancy. Recommended 1 year follow up screening.  -CRC screening is UTD.  -Pap is up to date per patient report.  Referred to GYN at last visit       Return in about 3 months (around 09/18/2024) for chronic condition follow up with fasting labs .    LAYMON LOISE CORE, FNP "

## 2024-06-21 NOTE — Assessment & Plan Note (Addendum)
 COPD stable. Denies frequent use of rescue inhaler. Patient denies shortness of breath, wheezing or chest pain.   V/s stable. All lung fields wheezing to auscultation bilaterally.   -Return precautions advised for which patient voices understanding.

## 2024-06-21 NOTE — Assessment & Plan Note (Addendum)
 Chronic diastolic CHF diagnosis dating back to 2015. Echo with preserved EJF in 2015.  She denies shortness of breath, palpitations,chest pain, orthopnea, or leg swelling.   V/s stable. No abnormal weight gain, weight loss of 5lbs since last visit. Weight loss intentional as she reports dietary changes where she stopped eating chocolate.     -Continue Metoprolol  100 mg once daily, refill provided -Continue Lasix  20 mg once daily, refill provided  -Return in 3 months for f/u    Orders:   furosemide  (LASIX ) 20 MG tablet; Take 1 tablet (20 mg total) by mouth daily.   metoprolol  succinate (TOPROL -XL) 100 MG 24 hr tablet; Take 1 tablet (100 mg total) by mouth daily. Take with or immediately following a meal.

## 2024-06-21 NOTE — Patient Instructions (Signed)
 SABRA

## 2024-07-20 ENCOUNTER — Encounter: Admitting: Registered Nurse

## 2024-09-20 ENCOUNTER — Ambulatory Visit: Admitting: Family Medicine
